# Patient Record
Sex: Male | Born: 1985 | Race: White | Hispanic: No | Marital: Single | Smoking: Current some day smoker
Health system: Southern US, Community
[De-identification: ages and names within clinical notes are randomized; demographics above are authoritative.]

## PROBLEM LIST (undated history)

## (undated) DIAGNOSIS — F419 Anxiety disorder, unspecified: Secondary | ICD-10-CM

## (undated) DIAGNOSIS — F209 Schizophrenia, unspecified: Secondary | ICD-10-CM

## (undated) HISTORY — PX: NO PAST SURGERIES: SHX2092

---

## 2016-01-26 ENCOUNTER — Emergency Department
Admission: EM | Admit: 2016-01-26 | Discharge: 2016-01-27 | Disposition: A | Discharge status: Other Acute Inpatient Hospital | Payer: Medicare Other | Attending: Emergency Medicine | Admitting: Emergency Medicine

## 2016-01-26 DIAGNOSIS — F17200 Nicotine dependence, unspecified, uncomplicated: Secondary | ICD-10-CM | POA: Diagnosis not present

## 2016-01-26 DIAGNOSIS — F200 Paranoid schizophrenia: Secondary | ICD-10-CM | POA: Diagnosis not present

## 2016-01-26 DIAGNOSIS — F209 Schizophrenia, unspecified: Secondary | ICD-10-CM | POA: Diagnosis not present

## 2016-01-26 DIAGNOSIS — Z008 Encounter for other general examination: Secondary | ICD-10-CM | POA: Diagnosis present

## 2016-01-26 DIAGNOSIS — R45851 Suicidal ideations: Secondary | ICD-10-CM | POA: Insufficient documentation

## 2016-01-26 HISTORY — DX: Anxiety disorder, unspecified: F41.9

## 2016-01-26 HISTORY — DX: Schizophrenia, unspecified: F20.9

## 2016-01-26 LAB — CBC WITH DIFFERENTIAL/PLATELET
BASOS ABS: 0 10*3/uL (ref 0–0.1)
BASOS PCT: 1 %
EOS ABS: 0.3 10*3/uL (ref 0–0.7)
Eosinophils Relative: 4 %
HCT: 47.3 % (ref 40.0–52.0)
HEMOGLOBIN: 16 g/dL (ref 13.0–18.0)
Lymphocytes Relative: 31 %
Lymphs Abs: 2.4 10*3/uL (ref 1.0–3.6)
MCH: 30.4 pg (ref 26.0–34.0)
MCHC: 33.9 g/dL (ref 32.0–36.0)
MCV: 89.6 fL (ref 80.0–100.0)
Monocytes Absolute: 0.5 10*3/uL (ref 0.2–1.0)
Monocytes Relative: 7 %
NEUTROS ABS: 4.5 10*3/uL (ref 1.4–6.5)
NEUTROS PCT: 57 %
Platelets: 258 10*3/uL (ref 150–440)
RBC: 5.27 MIL/uL (ref 4.40–5.90)
RDW: 13.1 % (ref 11.5–14.5)
WBC: 7.8 10*3/uL (ref 3.8–10.6)

## 2016-01-26 LAB — ETHANOL

## 2016-01-26 LAB — COMPREHENSIVE METABOLIC PANEL
ALBUMIN: 4.6 g/dL (ref 3.5–5.0)
ALT: 22 U/L (ref 17–63)
ANION GAP: 5 (ref 5–15)
AST: 31 U/L (ref 15–41)
Alkaline Phosphatase: 67 U/L (ref 38–126)
BUN: 22 mg/dL — ABNORMAL HIGH (ref 6–20)
CALCIUM: 9.3 mg/dL (ref 8.9–10.3)
CHLORIDE: 107 mmol/L (ref 101–111)
CO2: 28 mmol/L (ref 22–32)
Creatinine, Ser: 0.9 mg/dL (ref 0.61–1.24)
GFR calc non Af Amer: >60 mL/min (ref >60)
GLUCOSE: 81 mg/dL (ref 65–99)
POTASSIUM: 4.1 mmol/L (ref 3.5–5.1)
SODIUM: 140 mmol/L (ref 135–145)
TOTAL PROTEIN: 7.2 g/dL (ref 6.5–8.1)
Total Bilirubin: 0.4 mg/dL (ref 0.3–1.2)

## 2016-01-26 NOTE — BH Assessment (Signed)
Assessment Note  Edwin RidgelCameron Montes is an 30 y.o. male presenting to the ED voluntarily with concerns of racing thoughts, anxiety and depression.  Pt also expressed vague suicidal thoughts without intent.  Pt denies any HI or auditory/visual hallucinations.  He denies any drug/alcohol use.  He reports that he feels that he needs to be hospitalized for about a week to "pull himself together".  Diagnosis: Anxiety  Past Medical History:  Past Medical History  Diagnosis Date  . Schizophrenia (HCC)   . Anxiety     History reviewed. No pertinent past surgical history.  Family History: History reviewed. No pertinent family history.  Social History:  reports that he has been smoking.  He does not have any smokeless tobacco history on file. He reports that he does not drink alcohol or use illicit drugs.  Additional Social History:  Alcohol / Drug Use History of alcohol / drug use?: No history of alcohol / drug abuse  CIWA: CIWA-Ar BP: 127/72 mmHg Pulse Rate: 71 COWS:    Allergies: No Known Allergies  Home Medications:  (Not in a hospital admission)  OB/GYN Status:  No LMP for male patient.  General Assessment Data [facility] Assessment: In system Is this a Tele or Face-to-Face Assessment?: Face-to-Face Is this an Initial Assessment or a Re-assessment for this encounter?: Initial Assessment Marital status: Single Maiden name: N/A Is patient pregnant?: No Pregnancy Status: No Living Arrangements: Alone Can pt return to current living arrangement?: Yes Admission Status: Voluntary Is patient capable of signing voluntary admission?: Yes Referral Source: Self/Family/Friend Insurance type: AETNA  Medical Screening Exam [facility] ONLY) Medical Exam completed: Yes  Crisis Care Plan Living Arrangements: Alone Legal Guardian: Other: (self) [facility]rist[facility]rapist: [facility]  Education Status Is patient currently in school?:  No Current Grade: N/A Highest grade of school patient has completed: N/A Name of school: N/A Contact person: N/A  Risk to self with the past 6 months Suicidal Ideation: Yes-Currently Present Has patient been a risk to self within the past 6 months prior to admission? : No Suicidal Intent: No Has patient had any suicidal intent within the past 6 months prior to admission? : No Is patient at risk for suicide?: No Suicidal Plan?: No Has patient had any suicidal plan within the past 6 months prior to admission? : No Access to Means: No What has been your use of drugs/alcohol within the last 12 months?: None reported by patient Previous Attempts/Gestures: No How many times?: 0 Other Self Harm Risks: None identified Triggers for Past Attempts: None known Intentional Self Injurious Behavior: None Family Suicide History: No Recent stressful life event(s): Other (Comment) Persecutory voices/beliefs?: No Depression: Yes Depression Symptoms: Loss of interest in usual pleasures Substance abuse history and/or treatment for substance abuse?: No Suicide prevention information given to non-admitted patients: Not applicable  Risk to Others within the past 6 months Homicidal Ideation: No Does patient have any lifetime risk of violence toward others beyond the six months prior to admission? : No Thoughts of Harm to Others: No Current Homicidal Intent: No Current Homicidal Plan: No Access to Homicidal Means: No Identified Victim: None dientified History of harm to others?: No Assessment of Violence: None Noted Violent Behavior Description: None identified Does patient have access to weapons?: No Criminal Charges Pending?: No Does patient have a court date: No Is patient on probation?: No  Psychosis Hallucinations: None noted Delusions: None noted  Mental Status Report Appearance/Hygiene: In scrubs Eye  Contact: Fair Motor Activity: Freedom of movement Speech: Logical/coherent Level  of Consciousness: Alert Mood: Depressed Affect: Flat Anxiety Level: Minimal Thought Processes: Coherent, Relevant Judgement: Unimpaired Orientation: Person, Place, Time, Situation, Appropriate for developmental age Obsessive Compulsive Thoughts/Behaviors: None  Cognitive Functioning Concentration: Normal Memory: Recent Intact, Remote Intact IQ: Average Insight: Good Impulse Control: Good Appetite: Good Weight Loss: 0 Weight Gain: 0 Sleep: No Change Total Hours of Sleep: 8 Vegetative Symptoms: None  ADLScreening [facility] Services) Patient's cognitive ability adequate to safely complete daily activities?: Yes Patient able to express need for assistance with ADLs?: Yes Independently performs ADLs?: Yes (appropriate for developmental age)  Prior Inpatient Therapy Prior Inpatient Therapy: Yes Prior Therapy Dates: 2012 Prior Therapy Facilty/Provider(s): [facility] Reason for Treatment: depression  Prior Outpatient Therapy Prior Outpatient Therapy: Yes Prior Therapy Dates: current Prior Therapy [facility]Provider([facility]): [facility] Reason for Treatment: anxiety Does patient have an ACCT team?: No Does patient have Intensive In-House Services?  : No Does patient have [facility] services? : No Does patient have P4CC services?: No  ADL Screening (condition at time of admission) Patient's cognitive ability adequate to safely complete daily activities?: Yes Patient able to express need for assistance with ADLs?: Yes Independently performs ADLs?: Yes (appropriate for developmental age)       Abuse/Neglect Assessment (Assessment to be complete while patient is alone) Physical Abuse: Denies Verbal Abuse: Denies Sexual Abuse: Denies Exploitation of patient/patient's resources: Denies Self-Neglect: Denies Values / Beliefs Cultural Requests During Hospitalization: None Spiritual Requests During Hospitalization: None Consults Spiritual Care Consult Needed: No Social Work Consult  Needed: No Merchant navy officer (For Healthcare) Does patient have an advance directive?: No    Additional Information 1:1 In Past 12 Months?: No CIRT Risk: No Elopement Risk: No Does patient have medical clearance?: Yes     Disposition:  Disposition Initial Assessment Completed for this Encounter: Yes Disposition of Patient: Other dispositions Other disposition(s): Other (Comment) (Psych MD consult)  On Site Evaluation by:   Reviewed with Physician:    [place] 01/26/2016 9:51 PM

## 2016-01-26 NOTE — ED Notes (Addendum)
Pt arrived to ED with c/o "racing thoughts". Pt states "I just want to get checked out for these racing thoughts and being a danger to myself." Pt denies SI or HI. Pt has PMH of anxiety and schizophrenia.

## 2016-01-27 ENCOUNTER — Inpatient Hospital Stay
Admission: EM | Admit: 2016-01-27 | Discharge: 2016-01-31 | DRG: 885 | Disposition: A | Discharge status: Home / Self Care | Payer: Medicare Other | Source: Intra-hospital | Attending: Psychiatry | Admitting: Psychiatry

## 2016-01-27 DIAGNOSIS — G4700 Insomnia, unspecified: Secondary | ICD-10-CM | POA: Diagnosis present

## 2016-01-27 DIAGNOSIS — Z818 Family history of other mental and behavioral disorders: Secondary | ICD-10-CM | POA: Diagnosis not present

## 2016-01-27 DIAGNOSIS — R45851 Suicidal ideations: Secondary | ICD-10-CM | POA: Insufficient documentation

## 2016-01-27 DIAGNOSIS — F22 Delusional disorders: Secondary | ICD-10-CM | POA: Diagnosis present

## 2016-01-27 DIAGNOSIS — F209 Schizophrenia, unspecified: Secondary | ICD-10-CM | POA: Diagnosis not present

## 2016-01-27 DIAGNOSIS — F17200 Nicotine dependence, unspecified, uncomplicated: Secondary | ICD-10-CM | POA: Diagnosis present

## 2016-01-27 DIAGNOSIS — F203 Undifferentiated schizophrenia: Principal | ICD-10-CM | POA: Diagnosis present

## 2016-01-27 DIAGNOSIS — F200 Paranoid schizophrenia: Secondary | ICD-10-CM

## 2016-01-27 DIAGNOSIS — Z23 Encounter for immunization: Secondary | ICD-10-CM | POA: Diagnosis not present

## 2016-01-27 LAB — URINALYSIS COMPLETE WITH MICROSCOPIC (ARMC ONLY)
BACTERIA UA: NONE SEEN
BILIRUBIN URINE: NEGATIVE
Glucose, UA: NEGATIVE mg/dL
HGB URINE DIPSTICK: NEGATIVE
KETONES UR: NEGATIVE mg/dL
Leukocytes, UA: NEGATIVE
NITRITE: NEGATIVE
PH: 6 (ref 5.0–8.0)
PROTEIN: NEGATIVE mg/dL
Specific Gravity, Urine: 1.025 (ref 1.005–1.030)
WBC UA: NONE SEEN WBC/hpf (ref 0–5)

## 2016-01-27 LAB — URINE DRUG SCREEN, QUALITATIVE (ARMC ONLY)
Amphetamines, Ur Screen: NOT DETECTED
BARBITURATES, UR SCREEN: NOT DETECTED
BENZODIAZEPINE, UR SCRN: NOT DETECTED
CANNABINOID 50 NG, UR ~~LOC~~: NOT DETECTED
Cocaine Metabolite,Ur ~~LOC~~: NOT DETECTED
MDMA (Ecstasy)Ur Screen: NOT DETECTED
Methadone Scn, Ur: NOT DETECTED
Opiate, Ur Screen: NOT DETECTED
PHENCYCLIDINE (PCP) UR S: NOT DETECTED
TRICYCLIC, UR SCREEN: NOT DETECTED

## 2016-01-27 LAB — LIPID PANEL
Cholesterol: 192 mg/dL (ref 0–200)
HDL: 61 mg/dL (ref >40)
LDL Cholesterol: 100 mg/dL — ABNORMAL HIGH (ref 0–99)
TRIGLYCERIDES: 154 mg/dL — AB (ref <150)
Total CHOL/HDL Ratio: 3.1 RATIO
VLDL: 31 mg/dL (ref 0–40)

## 2016-01-27 LAB — TSH: TSH: 1.495 u[IU]/mL (ref 0.350–4.500)

## 2016-01-27 MED ORDER — NICOTINE 21 MG/24HR TD PT24
21.0000 mg | MEDICATED_PATCH | Freq: Every day | TRANSDERMAL | Status: DC
  Administered 2016-01-28 – 2016-01-31 (×3): 21 mg via TRANSDERMAL
  Filled 2016-01-27 (×4): qty 1

## 2016-01-27 MED ORDER — TRAZODONE HCL 100 MG PO TABS
100.0000 mg | ORAL_TABLET | Freq: Every day | ORAL | Status: DC
  Administered 2016-01-28 – 2016-01-30 (×3): 100 mg via ORAL
  Filled 2016-01-27 (×3): qty 1

## 2016-01-27 MED ORDER — HYDROXYZINE HCL 25 MG PO TABS
25.0000 mg | ORAL_TABLET | Freq: Three times a day (TID) | ORAL | Status: DC | PRN
  Administered 2016-01-29: 25 mg via ORAL
  Filled 2016-01-27: qty 1

## 2016-01-27 MED ORDER — ALUM & MAG HYDROXIDE-SIMETH 200-200-20 MG/5ML PO SUSP: 30.0000 mL | ORAL | Status: DC | PRN

## 2016-01-27 MED ORDER — INFLUENZA VAC SPLIT QUAD 0.5 ML IM SUSY
0.5000 mL | PREFILLED_SYRINGE | INTRAMUSCULAR | Status: AC
  Administered 2016-01-28: 0.5 mL via INTRAMUSCULAR
  Filled 2016-01-27: qty 0.5

## 2016-01-27 MED ORDER — MAGNESIUM HYDROXIDE 400 MG/5ML PO SUSP: 30.0000 mL | Freq: Every day | ORAL | Status: DC | PRN

## 2016-01-27 MED ORDER — ARIPIPRAZOLE 10 MG PO TABS
20.0000 mg | ORAL_TABLET | Freq: Every day | ORAL | Status: DC
  Administered 2016-01-28: 20 mg via ORAL
  Filled 2016-01-27: qty 2

## 2016-01-27 MED ORDER — ACETAMINOPHEN 325 MG PO TABS: 650.0000 mg | ORAL_TABLET | Freq: Four times a day (QID) | ORAL | Status: DC | PRN

## 2016-01-27 NOTE — Progress Notes (Signed)
D:  Patient is a 30  year-old male admitted to [facility] ambulatory without difficulty.  Patient is alert and oriented upon admission. A:  Admission assessment completed without difficulty.  Skin and contraband assessment completed with no skin abnormalities nor contraband found.  Q.15 minute safety checks were implemented at the time of admission.  Patient was oriented to the unit and escorted to room #309. R:  Patient was receptive to and cooperative with admission assessment.  Patient contracts for safety on the unit at this time

## 2016-01-27 NOTE — ED Notes (Signed)
[facility] for inpatient treatment. Will continue all security checks until discharge.

## 2016-01-27 NOTE — ED Notes (Signed)
Patient resting quietly in room. No noted distress or abnormal behaviors noted. Will continue 15 minute checks and observation by security camera for safety. 

## 2016-01-27 NOTE — ED Provider Notes (Signed)
[facility] Emergency Department Provider Note  ____________________________________________  Time seen: 11:45 PM  I have reviewed the triage vital signs and the nursing notes.   HISTORY  Chief Complaint Psychiatric Evaluation    HPI Edwin Montes is a 30 y.o. male presents with "racing thoughts in the past few months with acute worsening in the past few days. Patient states that he "just want to get checked out for his racing thoughts". Patient also admits to periodic thoughts of hurting himself in no specific plan.      Past Medical History  Diagnosis Date  . Schizophrenia (HCC)   . Anxiety     There are no active problems to display for this patient.    past surgical history  None  No current outpatient prescriptions on file.  Allergies  No known drug allergies  History reviewed. No pertinent family history.  Social History Social History  Substance Use Topics  . Smoking status: Current Some Day Smoker  . Smokeless tobacco: None  . Alcohol Use: No    Review of Systems  Constitutional: Negative for fever. Eyes: Negative for visual changes. ENT: Negative for sore throat. Cardiovascular: Negative for chest pain. Respiratory: Negative for shortness of breath. Gastrointestinal: Negative for abdominal pain, vomiting and diarrhea. Genitourinary: Negative for dysuria. Musculoskeletal: Negative for back pain. Skin: Negative for rash. Neurological: Negative for headaches, focal weakness or numbness. Psychiatric: Positive for racing thoughts and suicidal ideation   10-point ROS otherwise negative.  ____________________________________________   PHYSICAL EXAM:  VITAL SIGNS: ED Triage Vitals  Enc Vitals Group     BP 01/26/16 1918 127/72 mmHg     Pulse Rate 01/26/16 1918 71     Resp 01/26/16 1918 18     Temp 01/26/16 1918 98.4 F (36.9 C)     Temp Source 01/26/16 1918 Oral     SpO2 01/26/16 1918 100 %     Weight 01/26/16 1918  178 lb (80.74 kg)     Height 01/26/16 1918  (1.88 m)     Head Cir --      Peak Flow --      Pain Score 01/26/16 2100 0     Pain Loc --      Pain Edu? --      Excl. in GC? --     Constitutional: Alert and oriented. Well appearing and in no distress. Eyes: Conjunctivae are normal. PERRL. Normal extraocular movements. ENT   Head: Normocephalic and atraumatic.   Nose: No congestion/rhinnorhea.   Mouth/Throat: Mucous membranes are moist.   Neck: No stridor. Hematological/Lymphatic/Immunilogical: No cervical lymphadenopathy. Cardiovascular: Normal rate, regular rhythm. Normal and symmetric distal pulses are present in all extremities. No murmurs, rubs, or gallops. Respiratory: Normal respiratory effort without tachypnea nor retractions. Breath sounds are clear and equal bilaterally. No wheezes/rales/rhonchi. Gastrointestinal: Soft and nontender. No distention. There is no CVA tenderness. Genitourinary: deferred Musculoskeletal: Nontender with normal range of motion in all extremities. No joint effusions.  No lower extremity tenderness nor edema. Neurologic:  Normal speech and language. No gross focal neurologic deficits are appreciated. Speech is normal.  Skin:  Skin is warm, dry and intact. No rash noted. Psychiatric: Mood and affect are normal. Speech and behavior are normal. Patient exhibits appropriate insight and judgment.  ____________________________________________    LABS (pertinent positives/negatives)  Labs Reviewed  COMPREHENSIVE METABOLIC PANEL - Abnormal; Notable for the following:    BUN 22 (*)    All other components within normal limits  ETHANOL  CBC WITH DIFFERENTIAL/PLATELET  URINE DRUG SCREEN, QUALITATIVE ([facility] ONLY)  URINALYSIS COMPLETEWITH MICROSCOPIC ([facility] ONLY)        INITIAL IMPRESSION / ASSESSMENT AND PLAN / ED COURSE  Pertinent labs & imaging results that were available during my care of the patient were reviewed by me and  considered in my medical decision making (see chart for details).  Awaiting psychiatric evaluation.  ____________________________________________   FINAL CLINICAL IMPRESSION(S) / ED DIAGNOSES  Final diagnoses:  Suicidal ideation      Darci Currentandolph N Geraldo Haris, MD 01/27/16 [phone]

## 2016-01-27 NOTE — ED Notes (Signed)
Patient asleep in room. No noted distress or abnormal behavior. Will continue 15 minute checks and observation by security cameras for safety. 

## 2016-01-27 NOTE — ED Provider Notes (Signed)
-----------------------------------------   7:29 AM on 01/27/2016 -----------------------------------------   Blood pressure 106/59, pulse 64, temperature 97.7 F (36.5 C), temperature source Oral, resp. rate 16, height 6\' 2"  (1.88 m), weight 178 lb (80.74 kg), SpO2 98 %.  The patient had no acute events since last update.  Calm and cooperative at this time.  Disposition is pending per Psychiatry/Behavioral Medicine team recommendations.     Jennye MoccasinBrian S Author Hatlestad, MD 01/27/16 [phone]

## 2016-01-27 NOTE — ED Notes (Signed)
Pt is alert and oriented on admission. Pt denies SI/HI and AVH and contracts for safety as well. Pt states that he wants to make good decisions and needs help with that goal. Pt also participates in a day program that wants to continue. Writer oriented pt to the [facility], discussed tx plan and 15 minute checks are ongoing for safety. Pt went to bed on arrival to the unit.

## 2016-01-27 NOTE — Consult Note (Signed)
[place] [facility] Consult   Reason for Consult:  Consult for 30 year old man who brought himself into the emergency room voluntarily Referring Physician:  Marcelene Butte Patient Identification: Edwin Montes MRN:  [id number] Principal Diagnosis: Schizophrenia [facility]) Diagnosis:   Patient Active Problem List   Diagnosis Date Noted  . Schizophrenia ([place]) [F20.9] 01/27/2016    Total Time spent with patient: 45 minutes  Subjective:   Edwin Montes is a 30 y.o. male patient admitted with "feelings of doubts and frustration".  HPI:  Patient interviewed. Chart reviewed although there is relatively little information. Labs reviewed. Patient brought himself into the emergency room voluntarily. He is not a very good historian. He told me that he was having feelings of doubts and frustration that just started yesterday. He has hard time being any more specific than that. He denies that he's having acute hallucinations. He denies to me that he was having any intention or thought of hurting himself although at the same time he tells me that he was worried that he might end up having thoughts about hurting himself. Denies any thoughts of hurting anybody else. He says he does have a lot of stress but when I ask him to describe what it is he couldn't come up with anything specific. He indicates that he thinks he sleeps more or less okay. Denies any physical symptoms. Denies any abuse of alcohol or drugs. He says he has been compliant with his psychiatric medicine although he can't remember exactly what it is. Apparently he goes to Dr. Alice Reichert for his primary psychiatric treatment. Despite the lack of details he does look troubled and seems to be having a hard time putting his thoughts together.  Social history: Patient says he lives by himself. He does have some family including the mother he stays in touch with. He says he goes to a day program regularly.  Medical history: Denies any medical problems. No  history of diabetes high blood pressure heart disease.  Substance abuse history: Denies any alcohol or drug abuse currently and denies that he's had any problems with alcohol or drug abuse in the past.  Past Psychiatric History: Patient says that he has been told that he has schizophrenia. He can't give me any detail about the symptoms of it. He had a hard time even telling me if he's ever been in a psychiatric hospital before. He seems to indicate that maybe he had but he couldn't quite remember area denies ever trying to kill himself in the past. Denies being violent. He says he only medicines he can remember were Zoloft Klonopin and "[place]".  Risk to Self: Suicidal Ideation: Yes-Currently Present Suicidal Intent: No Is patient at risk for suicide?: No Suicidal Plan?: No Access to Means: No What has been your use of drugs/alcohol within the last 12 months?: None reported by patient How many times?: 0 Other Self Harm Risks: None identified Triggers for Past Attempts: None known Intentional Self Injurious Behavior: None Risk to Others: Homicidal Ideation: No Thoughts of Harm to Others: No Current Homicidal Intent: No Current Homicidal Plan: No Access to Homicidal Means: No Identified Victim: None dientified History of harm to others?: No Assessment of Violence: None Noted Violent Behavior Description: None identified Does patient have access to weapons?: No Criminal Charges Pending?: No Does patient have a court date: No Prior Inpatient Therapy: Prior Inpatient Therapy: Yes Prior Therapy Dates: 2012 Prior Therapy Facilty[facility]Provider(s): [facility] Reason for Treatment: depression Prior Outpatient Therapy: Prior Outpatient Therapy: Yes Prior Therapy Dates: current  Prior Therapy Facilty/Provider(s): [facility] Reason for Treatment: anxiety Does patient have an ACCT team?: No Does patient have Intensive In-House Services?  : No Does patient have [facility] services? : No Does patient have  P4CC services?: No  Past Medical History:  Past Medical History  Diagnosis Date  . Schizophrenia ([place])   . Anxiety    History reviewed. No pertinent past surgical history. Family History: History reviewed. No pertinent family history. Family Psychiatric  History: Patient says that his grandmother had depression but he doesn't know of any other family history of mental health problems. Social History:  History  Alcohol Use No     History  Drug Use No    Social History   Social History  . Marital Status: Single    Spouse Name: N/A  . Number of Children: N/A  . Years of Education: N/A   Social History Main Topics  . Smoking status: Current Some Day Smoker  . Smokeless tobacco: None  . Alcohol Use: No  . Drug Use: No  . Sexual Activity: Not Asked   Other Topics Concern  . None   Social History Narrative  . None   Additional Social History:    Allergies:  No Known Allergies  Labs:  Results for orders placed or performed during the hospital encounter of 01/26/16 (from the past 48 hour(s))  Comprehensive metabolic panel     Status: Abnormal   Collection Time: 01/26/16  7:23 PM  Result Value Ref Range   Sodium 140 135 - 145 mmol/L   Potassium 4.1 3.5 - 5.1 mmol/L   Chloride 107 101 - 111 mmol/L   CO2 28 22 - 32 mmol/L   Glucose, Bld 81 65 - 99 mg/dL   BUN 22 (H) 6 - 20 mg/dL   Creatinine, Ser 0.90 0.61 - 1.24 mg/dL   Calcium 9.3 8.9 - 10.3 mg/dL   Total Protein 7.2 6.5 - 8.1 g/dL   Albumin 4.6 3.5 - 5.0 g/dL   AST 31 15 - 41 U/L   ALT 22 17 - 63 U/L   Alkaline Phosphatase 67 38 - 126 U/L   Total Bilirubin 0.4 0.3 - 1.2 mg/dL   GFR calc non Af Amer >60 >60 mL/min   GFR calc Af Amer >60 >60 mL/min    Comment: (NOTE) The eGFR has been calculated using the CKD EPI equation. This calculation has not been validated in all clinical situations. eGFR's persistently <60 mL/min signify possible Chronic Kidney Disease.    Anion gap 5 5 - 15  Ethanol     Status: None    Collection Time: 01/26/16  7:23 PM  Result Value Ref Range   Alcohol, Ethyl (B) <5 <5 mg/dL    Comment:        LOWEST DETECTABLE LIMIT FOR SERUM ALCOHOL IS 5 mg/dL FOR MEDICAL PURPOSES ONLY   CBC with Diff     Status: None   Collection Time: 01/26/16  7:23 PM  Result Value Ref Range   WBC 7.8 3.8 - 10.6 K/uL   RBC 5.27 4.40 - 5.90 MIL/uL   Hemoglobin 16.0 13.0 - 18.0 g/dL   HCT 47.3 40.0 - 52.0 %   MCV 89.6 80.0 - 100.0 fL   MCH 30.4 26.0 - 34.0 pg   MCHC 33.9 32.0 - 36.0 g/dL   RDW 13.1 11.5 - 14.5 %   Platelets 258 150 - 440 K/uL   Neutrophils Relative % 57 %   Neutro Abs 4.5 1.4 - 6.5  K/uL   Lymphocytes Relative 31 %   Lymphs Abs 2.4 1.0 - 3.6 K/uL   Monocytes Relative 7 %   Monocytes Absolute 0.5 0.2 - 1.0 K/uL   Eosinophils Relative 4 %   Eosinophils Absolute 0.3 0 - 0.7 K/uL   Basophils Relative 1 %   Basophils Absolute 0.0 0 - 0.1 K/uL  Urine Drug Screen, Qualitative ([facility] only)     Status: None   Collection Time: 01/27/16 12:06 AM  Result Value Ref Range   Tricyclic, Ur Screen NONE DETECTED NONE DETECTED   Amphetamines, Ur Screen NONE DETECTED NONE DETECTED   MDMA (Ecstasy)Ur Screen NONE DETECTED NONE DETECTED   Cocaine Metabolite,Ur [place] NONE DETECTED NONE DETECTED   Opiate, Ur Screen NONE DETECTED NONE DETECTED   Phencyclidine (PCP) Ur S NONE DETECTED NONE DETECTED   Cannabinoid 50 Ng, Ur [place] NONE DETECTED NONE DETECTED   Barbiturates, Ur Screen NONE DETECTED NONE DETECTED   Benzodiazepine, Ur Scrn NONE DETECTED NONE DETECTED   Methadone Scn, Ur NONE DETECTED NONE DETECTED    Comment: (NOTE) 614  Tricyclics, urine               Cutoff 1000 ng/mL 200  Amphetamines, urine             Cutoff 1000 ng/mL 300  MDMA (Ecstasy), urine           Cutoff 500 ng/mL 400  Cocaine Metabolite, urine       Cutoff 300 ng/mL 500  Opiate, urine                   Cutoff 300 ng/mL 600  Phencyclidine (PCP), urine      Cutoff 25 ng/mL 700  Cannabinoid, urine              Cutoff 50  ng/mL 800  Barbiturates, urine             Cutoff 200 ng/mL 900  Benzodiazepine, urine           Cutoff 200 ng/mL 1000 Methadone, urine                Cutoff 300 ng/mL 1100 1200 The urine drug screen provides only a preliminary, unconfirmed 1300 analytical test result and should not be used for non-medical 1400 purposes. Clinical consideration and professional judgment should 1500 be applied to any positive drug screen result due to possible 1600 interfering substances. A more specific alternate chemical method 1700 must be used in order to obtain a confirmed analytical result.  1800 Gas chromato graphy / mass spectrometry (GC/MS) is the preferred 1900 confirmatory method.   Urinalysis complete, with microscopic ([facility] only)     Status: Abnormal   Collection Time: 01/27/16 12:06 AM  Result Value Ref Range   Color, Urine AMBER (A) YELLOW   APPearance TURBID (A) CLEAR   Glucose, UA NEGATIVE NEGATIVE mg/dL   Bilirubin Urine NEGATIVE NEGATIVE   Ketones, ur NEGATIVE NEGATIVE mg/dL   Specific Gravity, Urine 1.025 1.005 - 1.030   Hgb urine dipstick NEGATIVE NEGATIVE   pH 6.0 5.0 - 8.0   Protein, ur NEGATIVE NEGATIVE mg/dL   Nitrite NEGATIVE NEGATIVE   Leukocytes, UA NEGATIVE NEGATIVE   RBC / HPF 0-5 0 - 5 RBC/hpf   WBC, UA NONE SEEN 0 - 5 WBC/hpf   Bacteria, UA NONE SEEN NONE SEEN   Squamous Epithelial / LPF 0-5 (A) NONE SEEN   Mucous PRESENT    Amorphous Crystal PRESENT  No current facility-administered medications for this encounter.   Current Outpatient Prescriptions  Medication Sig Dispense Refill  . ARIPiprazole (ABILIFY) 20 MG tablet Take 20 mg by mouth at bedtime.    Marland Kitchen HYDROXYZINE HCL PO Take 1 tablet by mouth at bedtime.      Musculoskeletal: Strength & Muscle Tone: within normal limits Gait & Station: normal Patient leans: N/A  Psychiatric Specialty Exam: Review of Systems  Constitutional: Negative.   HENT: Negative.   Eyes: Negative.   Respiratory:  Negative.   Cardiovascular: Negative.   Gastrointestinal: Negative.   Musculoskeletal: Negative.   Skin: Negative.   Neurological: Negative.   Psychiatric/Behavioral: Negative for depression, suicidal ideas, hallucinations, memory loss and substance abuse. The patient is nervous/anxious. The patient does not have insomnia.     Blood pressure 106/59, pulse 64, temperature 97.7 F (36.5 C), temperature source Oral, resp. rate 16, height '6\' 2"'  (1.88 m), weight 80.74 kg (178 lb), SpO2 98 %.Body mass index is 22.84 kg/(m^2).  General Appearance: Casual  Eye Contact::  None  Speech:  Slow  Volume:  Decreased  Mood:  Anxious  Affect:  Flat  Thought Process:  Disorganized  Orientation:  Full (Time, Place, and Person)  Thought Content:  Rumination  Suicidal Thoughts:  Yes.  without intent/plan  Homicidal Thoughts:  No  Memory:  Immediate;   Good Recent;   Good Remote;   Poor  Judgement:  Impaired  Insight:  Shallow  Psychomotor Activity:  Decreased  Concentration:  Poor  Recall:  Poor  Fund of Knowledge:Fair  Language: Fair  Akathisia:  No  Handed:  Right  AIMS (if indicated):     Assets:  Housing Physical Health  ADL's:  Intact  Cognition: Impaired,  Mild  Sleep:      Treatment Plan Summary: Daily contact with patient to assess and evaluate symptoms and progress in treatment, Medication management and Plan 30 year old man who says he's got a past history of schizophrenia. Although he has a hard time articulating symptoms everything about his presentation looks like he probably does have schizophrenia. He has a very flat affect paucity of speech seems to lack motivation or lack much organization to his thoughts. For some reason he felt he needed to come into the hospital and seems to still want to do that. He did make some statements about how he might have thoughts about hurting himself although when you get down the specifics there is not much there. Nevertheless since he is living  by himself and we don't know much else about a month going to admit him to the hospital. He couldn't tell me a name to his medicine that made any sense to me. According to the reconciliation he's been prescribed Abilify 20 mg a day so on going to continue the prescription for that. Continue observation. Check TSH prolactin lipid panel and hemoglobin A1c. Engage in individual and group activities downstairs. Patient is voluntary.  Disposition: Recommend psychiatric Inpatient admission when medically cleared.  Alethia Berthold, MD 01/27/2016 1:08 PM

## 2016-01-27 NOTE — Tx Team (Signed)
Initial Interdisciplinary Treatment Plan   PATIENT STRESSORS: Medication change or noncompliance   PATIENT STRENGTHS: Average or above average intelligence Communication skills General fund of knowledge Physical Health Supportive family/friends   PROBLEM LIST: Problem List/Patient Goals Date to be addressed Date deferred Reason deferred Estimated date of resolution  "make better decisions" 01/27/16     anxiety 01/27/16     "pull myself together" 01/27/16     Racing thoughts 01/27/16                                    DISCHARGE CRITERIA:  Improved stabilization in mood, thinking, and/or behavior Motivation to continue treatment in a less acute level of care  PRELIMINARY DISCHARGE PLAN: Outpatient therapy  PATIENT/FAMIILY INVOLVEMENT: This treatment plan has been presented to and reviewed with the patient, Edwin Montes.  The patient and family have been given the opportunity to ask questions and make suggestions.  Moshe SalisburyJennifer L Adem Costlow 01/27/2016, 7:13 PM

## 2016-01-27 NOTE — ED Notes (Signed)
Supper and milk given to pt. 

## 2016-01-27 NOTE — ED Notes (Signed)

## 2016-01-27 NOTE — ED Notes (Signed)
[facility]. Cooperative with transfer. All personal belongings sent with patient.

## 2016-01-27 NOTE — ED Notes (Signed)
Patient resting quietly in room. No noted distress or abnormal behaviors noted. Will continue 15 minute checks and observation by security camera for safety. Meal given. Patient states his "racing thoughts" are lessening. He denies SI or HI. No evidence of psychosis.

## 2016-01-27 NOTE — ED Notes (Signed)
Pt presents c/o having "racing thoughts" for "a couple of years" but states they have been getting worse today. Pt reports "I figures I'd get checked in." Pt denies having an SI plan. Pt denies active SI at this time. [facility] but does not have therapy. Pt calm and cooperative. No increased work in breathing noted.

## 2016-01-27 NOTE — ED Provider Notes (Signed)
-----------------------------------------   6:07 PM on 01/27/2016 -----------------------------------------  Patient to be admitted to psychiatry [facility].  Minna AntisKevin Conor Filsaime, MD 01/27/16 1807

## 2016-01-28 DIAGNOSIS — F203 Undifferentiated schizophrenia: Secondary | ICD-10-CM | POA: Diagnosis not present

## 2016-01-28 LAB — HEMOGLOBIN A1C: Hgb A1c MFr Bld: 4.8 % (ref 4.0–6.0)

## 2016-01-28 MED ORDER — CLONAZEPAM 0.5 MG PO TABS
0.5000 mg | ORAL_TABLET | Freq: Three times a day (TID) | ORAL | Status: DC
  Administered 2016-01-28 – 2016-01-31 (×9): 0.5 mg via ORAL
  Filled 2016-01-28 (×9): qty 1

## 2016-01-28 MED ORDER — ARIPIPRAZOLE 10 MG PO TABS
20.0000 mg | ORAL_TABLET | Freq: Every day | ORAL | Status: DC
  Administered 2016-01-29 – 2016-01-30 (×2): 20 mg via ORAL
  Filled 2016-01-28 (×2): qty 2

## 2016-01-28 MED ORDER — TOPIRAMATE 100 MG PO TABS
100.0000 mg | ORAL_TABLET | Freq: Every day | ORAL | Status: DC
  Administered 2016-01-28 – 2016-01-30 (×3): 100 mg via ORAL
  Filled 2016-01-28 (×3): qty 1

## 2016-01-28 NOTE — H&P (Signed)
Psychiatric Admission Assessment Adult  Patient Identification: Edwin RidgelCameron Montes MRN:  [id number] Date of Evaluation:  01/28/2016 Chief Complaint:  Schizophrenia Principal Diagnosis: Undifferentiated schizophrenia (HCC) Diagnosis:   Patient Active Problem List   Diagnosis Date Noted  . Undifferentiated schizophrenia (HCC) [F20.3] 01/27/2016  . Tobacco use disorder [F17.200] 01/27/2016  . Suicidal ideation [R45.851]    History of Present Illness:  Identifying data. [name] is a 30 year old male with history of schizophrenia.  Chief complaint. "My thoughts are racing and I'm suicidal for a day."  History of present illness. Information was obtained from the patient and the chart. The patient has a long history of schizophrenia diagnosed about 10 years ago he has been doing exceedingly well on Abilify and graduated from the group home into his own supervised apartment several months ago. He sees [facility] irregularity and takes Abilify as prescribed. For about a day, the patient has been feeling increasingly anxious, with racing disorganized thoughts, insomnia and vague thoughts of hurting himself. He denies frank suicidal ideations intentions or plans but feels confused, paranoid and uncertain. He worries about his safety. He admits to "occasional" auditory hallucinations, paranoia, clairvoyance, messages from songs and TV. He sustained usually good has been poor lately. His anxiety has increased. He reports symptoms of generalized anxiety disorder, infrequent panic attacks and some symptoms suggestive of OCD with excessive worries, excessive cleaning and organizing. He denies PTSD symptoms. He denies symptoms of depression but admits that when his anxiety gets high he also feels depressed. He denies alcohol or illicit drugs or prescription pill abuse.  Past psychiatric history. The patient is not a good historian and oftentimes gives me conflicting information. Apparently he was diagnosed  at the age of 30 and was placed in a group home at that time. He has been a resident of multiple group homes up until recently when he "graduated" and was allowed to live in his own apartment. He reports being hospitalized 7 or 8 times in the past. He denies ever attempting suicide. He cannot explain why he was in the hospital. He believes that he was given diagnosis of bipolar, schizoaffective disorder, schizophrenia, and anxiety. He has been tried on multiple medications including Risperdal, Invega, Zyprexa, Latuda, and Saphris. He does not remember ever being on Seroquel or Geodon. He was tried on Depakote but not tolerated. He has never taken lithium or Tegretol. In the past he was treated with SSRIs for anxiety. He was given Klonopin in the past.  Family psychiatric history. Grandmother with depression.  Social history. [place] He graduated from high school. He denies any history of abuse. He has been a resident of multiple group homes. Apparently he recently moved into independent, supervised apartment in [place]. He sees [facility] and Dr. Lacie ScottsNiemeyer as his primary doctor. [facility] [employer]  Total Time spent with patient: 1 hour  Past Psychiatric History: Schizophrenia.  Is the patient at risk to self? Yes.    Has the patient been a risk to self in the past 6 months? No.  Has the patient been a risk to self within the distant past? No.  Is the patient a risk to others? No.  Has the patient been a risk to others in the past 6 months? No.  Has the patient been a risk to others within the distant past? No.   Prior Inpatient Therapy:   Prior Outpatient Therapy:  Alcohol Screening: 1. How often do you have a drink containing alcohol?: Never 2. How many drinks containing alcohol do you have on a typical day when you are drinking?: 1 or 2 3. How often do you have six or more drinks on one occasion?:  Never Preliminary Score: 0 4. How often during the last year have you found that you were not able to stop drinking once you had started?: Never 5. How often during the last year have you failed to do what was normally expected from you becasue of drinking?: Never 6. How often during the last year have you needed a first drink in the morning to get yourself going after a heavy drinking session?: Never 7. How often during the last year have you had a feeling of guilt of remorse after drinking?: Never 8. How often during the last year have you been unable to remember what happened the night before because you had been drinking?: Never 9. Have you or someone else been injured as a result of your drinking?: No 10. Has a relative or friend or a doctor or another health worker been concerned about your drinking or suggested you cut down?: No Alcohol Use Disorder Identification Test Final Score (AUDIT): 0 Brief Intervention: AUDIT score less than 7 or less-screening does not suggest unhealthy drinking-brief intervention not indicated Substance Abuse History in the last 12 months:  No. Consequences of Substance Abuse: NA Previous Psychotropic Medications: Yes  Psychological Evaluations: No  Past Medical History:  Past Medical History  Diagnosis Date  . Schizophrenia (HCC)   . Anxiety    History reviewed. No pertinent past surgical history. Family History: History reviewed. No pertinent family history. Family Psychiatric  History: Grandmother with depression. Tobacco Screening: [phone]::1)@ Social History:  History  Alcohol Use No     History  Drug Use No    Additional Social History:                           Allergies:  No Known Allergies Lab Results:  Results for orders placed or performed during the hospital encounter of 01/27/16 (from the past 48 hour(s))  Hemoglobin A1c     Status: None   Collection Time: 01/27/16  7:42 PM  Result Value Ref Range   Hgb A1c  MFr Bld 4.8 4.0 - 6.0 %  Lipid panel, fasting     Status: Abnormal   Collection Time: 01/27/16  7:42 PM  Result Value Ref Range   Cholesterol 192 0 - 200 mg/dL   Triglycerides 161 (H) <150 mg/dL   HDL 61 >09 mg/dL   Total CHOL/HDL Ratio 3.1 RATIO   VLDL 31 0 - 40 mg/dL   LDL Cholesterol 604 (H) 0 - 99 mg/dL    Comment:        Total Cholesterol/HDL:CHD Risk Coronary Heart Disease Risk Table                     Men   Women  1/2 Average Risk   3.4   3.3  Average Risk       5.0   4.4  2 X Average Risk   9.6   7.1  3 X Average Risk  23.4   11.0        Use the calculated Patient Ratio above and the CHD Risk Table to determine the patient's CHD Risk.        ATP III CLASSIFICATION (LDL):  <  100     mg/dL   Optimal  161-096  mg/dL   Near or Above                    Optimal  130-159  mg/dL   Borderline  045-409  mg/dL   High  >811     mg/dL   Very High   TSH     Status: None   Collection Time: 01/27/16  7:42 PM  Result Value Ref Range   TSH 1.495 0.350 - 4.500 uIU/mL    Blood Alcohol level:  Lab Results  Component Value Date   ETH <5 01/26/2016    Metabolic Disorder Labs:  Lab Results  Component Value Date   HGBA1C 4.8 01/27/2016   No results found for: PROLACTIN Lab Results  Component Value Date   CHOL 192 01/27/2016   TRIG 154* 01/27/2016   HDL 61 01/27/2016   CHOLHDL 3.1 01/27/2016   VLDL 31 01/27/2016   LDLCALC 100* 01/27/2016    Current Medications: Current Facility-Administered Medications  Medication Dose Route Frequency Provider Last Rate Last Dose  . acetaminophen (TYLENOL) tablet 650 mg  650 mg Oral Q6H PRN Audery Amel, MD      . alum & mag hydroxide-simeth (MAALOX/MYLANTA) 200-200-20 MG/5ML suspension 30 mL  30 mL Oral Q4H PRN Audery Amel, MD      . Melene Muller ON 01/29/2016] ARIPiprazole (ABILIFY) tablet 20 mg  20 mg Oral QHS Zayden Maffei B Wateen Varon, MD      . hydrOXYzine (ATARAX/VISTARIL) tablet 25 mg  25 mg Oral TID PRN Yuvraj Pfeifer B Ellissa Ayo, MD      .  magnesium hydroxide (MILK OF MAGNESIA) suspension 30 mL  30 mL Oral Daily PRN Audery Amel, MD      . nicotine (NICODERM CQ - dosed in mg/24 hours) patch 21 mg  21 mg Transdermal Daily Keelyn Fjelstad B Ludie Pavlik, MD   21 mg at 01/28/16 0904  . traZODone (DESYREL) tablet 100 mg  100 mg Oral QHS Kerrick Miler B Nelly Scriven, MD   100 mg at 01/27/16 2200   PTA Medications: Prescriptions prior to admission  Medication Sig Dispense Refill Last Dose  . ARIPiprazole (ABILIFY) 20 MG tablet Take 20 mg by mouth at bedtime.   unknown at unknown  . HYDROXYZINE HCL PO Take 1 tablet by mouth at bedtime.   unknown at unknown    Musculoskeletal: Strength & Muscle Tone: within normal limits Gait & Station: normal Patient leans: N/A  Psychiatric Specialty Exam: Physical Exam  Nursing note and vitals reviewed. Constitutional: He is oriented to person, place, and time. He appears well-developed and well-nourished.  HENT:  Head: Normocephalic and atraumatic.  Eyes: Conjunctivae and EOM are normal. Pupils are equal, round, and reactive to light.  Neck: Normal range of motion.  Cardiovascular: Normal rate, regular rhythm and normal heart sounds.   Respiratory: Effort normal and breath sounds normal.  GI: Soft. Bowel sounds are normal.  Musculoskeletal: Normal range of motion.  Neurological: He is alert and oriented to person, place, and time.  Skin: Skin is warm and dry.    Review of Systems  Psychiatric/Behavioral: Positive for suicidal ideas and hallucinations. The patient is nervous/anxious.   All other systems reviewed and are negative.   Blood pressure 99/72, pulse 80, temperature 97.8 F (36.6 C), temperature source Oral, resp. rate 18, height 6\' 2"  (1.88 m), weight 81.74 kg (180 lb 3.3 oz), SpO2 100 %.Body mass index is 23.13 kg/(m^2).  General Appearance:  Casual  Eye Contact::  Good  Speech:  Clear and Coherent  Volume:  Normal  Mood:  Anxious  Affect:  Blunt  Thought Process:  Disorganized   Orientation:  Full (Time, Place, and Person)  Thought Content:  Delusions, Hallucinations: Auditory and Paranoid Ideation  Suicidal Thoughts:  Yes.  with intent/plan  Homicidal Thoughts:  No  Memory:  Immediate;   Fair Recent;   Fair Remote;   Fair  Judgement:  Fair  Insight:  Shallow  Psychomotor Activity:  Normal  Concentration:  Poor  Recall:  [employer] of Knowledge:Fair  Language: Fair  Akathisia:  No  Handed:  Right  AIMS (if indicated):     Assets:  Communication Skills Desire for Improvement Financial Resources/Insurance Housing Physical Health Resilience Social Support  ADL's:  Intact  Cognition: WNL  Sleep:  Number of Hours: 7.5     Treatment Plan Summary: Daily contact with patient to assess and evaluate symptoms and progress in treatment and Medication management   Mr. Chauvin is a 30 year old male with a history of schizophrenia admitted for suicidal ideation and disorganized thinking in the context of good medication adherence.  1. Suicidal ideation. The patient is able to contract for safety in the hospital.  2. Mood/Psychosis. He has been maintained on 20 mg of Abilify. He claims to be compliant. He does not want to increase the dose. Abilify Maintena under negotiations.   3. Headaches. We will start Topamax for headache prevention and mood stabilization.  4. Anxiety. He was taking clonazepam in the past there is no longer prescribed by [facility]. I will give low dose clonazepam while in the hospital to further stabilize the mood. He still has prn Vistaril. Please consider adding an SSRI for treatment of anxiety.  5. Metabolic syndrome screening. Lipid profile, TSH, and hemoglobin A1c are normal. Prolactin is pending.  6. Smoking. Nicotine patch is available.  7. Insomnia. Trazodone is available.  8. Disposition. The patient will be discharged to home he will follow up with Dr. Georjean Mode at [facility].   Observation Level/Precautions:  15 minute checks  Laboratory:   CBC Chemistry Profile UDS UA  Psychotherapy:    Medications:    Consultations:    Discharge Concerns:    Estimated LOS:  Other:     I certify that inpatient services furnished can reasonably be expected to improve the patient's condition.    Kristine Linea, MD 3/17/201712:40 PM

## 2016-01-28 NOTE — [facility] Group Notes (Signed)
[facility] LCSW Group Therapy  01/28/2016 3:33 PM  Type of Therapy:  Group Therapy  Participation Level:  Did Not Attend    Edwin Cherian T, MSW, LCSWA 01/28/2016, 3:33 PM  

## 2016-01-28 NOTE — Progress Notes (Signed)
D: Pt denies SI/HI/AVH, affect is flat and sad,  mood is pleasant and cooperative with care. Pt  Isolates to the room, he appears less anxious. Patient is not interacting with peers and has minimal interaction with staff.  A: Pt was offered support and encouragement. Pt was given scheduled medications. Pt was encouraged to attend groups. Q 15 minute checks were done for safety.  R:Pt did not attend group. Pt went to bed and refused nighttime medication. Pt has no complaints.Pt receptive to treatment and safety maintained on unit.

## 2016-01-28 NOTE — Plan of Care (Signed)
Problem: Diagnosis: Increased Risk For Suicide Attempt Goal: LTG-Patient Will Report Improved Mood and Deny Suicidal LTG (by discharge) Patient will report improved mood and deny suicidal ideation.  Outcome: Progressing Patient denies SI/HI     

## 2016-01-28 NOTE — [facility] Group Notes (Signed)
[facility] Group Notes:  (Nursing/MHT/Case Management/Adjunct)  Date:  01/28/2016  Time:  6:56 PM  Type of Therapy:  Group Therapy  Participation Level:  Active  Participation Quality:  Appropriate, Attentive and Supportive  Affect:  Appropriate  Cognitive:  Alert and Appropriate  Insight:  Appropriate and Good  Engagement in Group:  Engaged  Modes of Intervention:  Socialization  Summary of Progress/Problems:  Edwin SouHeather L Charrie Mcconnon 01/28/2016, 6:56 PM

## 2016-01-28 NOTE — [facility] Suicide Risk Assessment (Signed)
[facility]   Nursing information obtained from:    Demographic factors:    Current Mental Status:    Loss Factors:    Historical Factors:    Risk Reduction Factors:     Total Time spent with patient: 1 hour Principal Problem: Undifferentiated schizophrenia (HCC) Diagnosis:   Patient Active Problem List   Diagnosis Date Noted  . Undifferentiated schizophrenia (HCC) [F20.3] 01/27/2016  . Tobacco use disorder [F17.200] 01/27/2016  . Suicidal ideation [R45.851]    Subjective Data: Racing disorganized thoughts, suicidal ideation.  Continued Clinical Symptoms:  Alcohol Use Disorder Identification Test Final Score (AUDIT): 0 The "Alcohol Use Disorders Identification Test", Guidelines for Use in Primary Care, Second Edition.  World Science writerHealth Organization [facility](WHO). Score between 0-7:  no or low risk or alcohol related problems. Score between 8-15:  moderate risk of alcohol related problems. Score between 16-19:  high risk of alcohol related problems. Score 20 or above:  warrants further diagnostic evaluation for alcohol dependence and treatment.   CLINICAL FACTORS:   Severe Anxiety and/or Agitation Schizophrenia:   Depressive state Less than 28 years old Paranoid or undifferentiated type   Musculoskeletal: Strength & Muscle Tone: within normal limits Gait & Station: normal Patient leans: N/A  Psychiatric Specialty Exam: Review of Systems  Psychiatric/Behavioral: Positive for hallucinations. The patient is nervous/anxious.   All other systems reviewed and are negative.   Blood pressure 99/72, pulse 80, temperature 97.8 F (36.6 C), temperature source Oral, resp. rate 18, height 6\' 2"  (1.88 m), weight 81.74 kg (180 lb 3.3 oz), SpO2 100 %.Body mass index is 23.13 kg/(m^2).  General Appearance: Casual  Eye Contact::  Good  Speech:  Clear and Coherent  Volume:  Normal  Mood:  Anxious and Dysphoric  Affect:  Blunt  Thought Process:  Disorganized   Orientation:  Full (Time, Place, and Person)  Thought Content:  Delusions, Hallucinations: Auditory and Paranoid Ideation  Suicidal Thoughts:  Yes.  with intent/plan  Homicidal Thoughts:  No  Memory:  Immediate;   Fair Recent;   Fair Remote;   Fair  Judgement:  Fair  Insight:  Shallow  Psychomotor Activity:  Normal  Concentration:  Fair  Recall:  [employer]  Fund of Knowledge:Fair  Language: Fair  Akathisia:  No  Handed:  Right  AIMS (if indicated):     Assets:  Communication Skills Desire for Improvement Financial Resources/Insurance Housing Physical Health Resilience Social Support  Sleep:  Number of Hours: 7.5  Cognition: WNL  ADL's:  Intact    COGNITIVE FEATURES THAT CONTRIBUTE TO RISK:  None    SUICIDE RISK:   Moderate:  Frequent suicidal ideation with limited intensity, and duration, some specificity in terms of plans, no associated intent, good self-control, limited dysphoria/symptomatology, some risk factors present, and identifiable protective factors, including available and accessible social support.  PLAN OF CARE: Hospital admission, medication management, discharge planning.  Edwin Montes is a 30 year old male with a history of schizophrenia admitted for suicidal ideation and disorganized thinking in the context of good medication adherence.  1. Suicidal ideation. The patient is able to contract for safety in the hospital.  2. Mood/Psychosis. He has been maintained on 20 mg of Abilify. He claims to be compliant. He does not want to increase the dose. Abilify Maintena under negotiations.   3. Headaches. We will start Topamax for headache prevention and mood stabilization.  4. Anxiety. He was taking clonazepam in the past there is no longer prescribed by [facility]. I will  give low dose clonazepam while in the hospital to further stabilize the mood. He still has prn Vistaril. Please consider adding an SSRI for treatment of anxiety.  5. Metabolic syndrome screening. Lipid  profile, TSH, and hemoglobin A1c are normal. Prolactin is pending.  6. Smoking. Nicotine patch is available.  7. Insomnia. Trazodone is available.  8. Disposition. The patient will be discharged to home he will follow up with Dr. Georjean Mode at [facility].  I certify that inpatient services furnished can reasonably be expected to improve the patient's condition.   Kristine Linea, MD 01/28/2016, 12:33 PM

## 2016-01-28 NOTE — Progress Notes (Signed)
Patient's mood anxious this morning.Later today he stated that he feels better.Denies suicidal or homicidal ideations and AV hallucinations.Compliant with medications & attended groups.Appetite & energy level good.

## 2016-01-28 NOTE — Progress Notes (Signed)
Recreation Therapy Notes  Date: 03.17.17 Time: 3:00 pm Location: Craft Room  Group Topic: Coping Skills  Goal Area(s) Addresses:  Patient will participate in healthy coping skill. Patient will verbalize benefit of using art as a coping skill.  Behavioral Response: Attentive  Intervention: Coloring  Activity: Patients were given coloring sheets instructed to color. While coloring, patients were encouraged to think about the emotions they were feeling and what they were focused on.  Education: LRT educated patients on healthy coping skills.  Education Outcome: In group clarification offered   Clinical Observations/Feedback: Patient completed activity by coloring color sheet. Patient did not contribute to group discussion.  Jacquelynn CreeGreene,Shandreka Dante M, LRT/CTRS 01/28/2016 4:36 PM

## 2016-01-28 NOTE — [facility] Group Notes (Signed)
[facility] Note   01/28/2016 1:35 PM  Participation Quality:  Active   Mood/Affect:  Flat  Depression Rating:  8  Anxiety Rating:  8  Thoughts of Suicide:  No Will you contract for safety?   NA  Current AVH:  No  Plan for Discharge/Comments:  Pt plans to return home and follow up with outpatient.  He states his depression and anxiety have slightly improved since admission. He reports he lives alone but states he does not have an outpatient provider.   Transportation Means: Family  Supports: Family   Edwin Montes Edwin Montes MSW, [employer]CSWA

## 2016-01-28 NOTE — Progress Notes (Signed)
Recreation Therapy Notes  INPATIENT RECREATION THERAPY ASSESSMENT  Patient Details Name: Edwin RidgelCameron Montes MRN: [id number] DOB: [birth year] Today's Date: 01/28/2016  Patient Stressors:  Patient reported no stressors.  Coping Skills:   Isolate, Exercise, Art/Dance, Talking, Music, Sports, Other (Comment) (Cleaning)  Personal Challenges: Anger, Communication, Concentration, Decision-Making, Self-Esteem/Confidence  Leisure Interests (2+):  Music - Play instrument, Individual - Other (Comment) (Soccer)  Awareness of Community Resources:  No  Community Resources:     Current Use:    If no, Barriers?:    Patient Strengths:  Caring, good life style  Patient Identified Areas of Improvement:  Making sure he has adequate amounts of belief- improve belief towards humanity  Current Recreation Participation:  Listening to music  Patient Goal for Hospitalization:  To stay active  [place]  [place] Residence:  [place]   Current [place] (including self-harm):  No  Current HI:  No  Consent to Intern Participation: N/A   Jacquelynn CreeGreene,Shatora Weatherbee M, LRT/CTRS 01/28/2016, 1:46 PM

## 2016-01-28 NOTE — Plan of Care (Signed)
Problem: Alteration in mood; excessive anxiety as evidenced by: Goal: LTG-Patient's behavior demonstrates decreased anxiety (Patient's behavior demonstrates anxiety and he/she is utilizing learned coping skills to deal with anxiety-producing situations)  Outcome: Progressing Patient verbalized decreased anxiety level.

## 2016-01-29 LAB — PROLACTIN: PROLACTIN: 1.7 ng/mL — AB (ref 4.0–15.2)

## 2016-01-29 MED ORDER — SERTRALINE HCL 25 MG PO TABS
25.0000 mg | ORAL_TABLET | Freq: Every day | ORAL | Status: DC
  Administered 2016-01-29 – 2016-01-30 (×2): 25 mg via ORAL
  Filled 2016-01-29 (×2): qty 1

## 2016-01-29 NOTE — Plan of Care (Signed)
Problem: Ineffective individual coping Goal: STG: Patient will remain free from self harm Outcome: Progressing Pt safe on the unit at this time     

## 2016-01-29 NOTE — Progress Notes (Signed)
[facility] Psych Progress Notes:   [facility] MD Progress Note  01/29/2016 1:59 PM Edwin Montes  MRN:  [id number]   Subjective:  [name] is a 30 year old male with history of schizophrenia.   Per H &P: history of schizophrenia diagnosed about 10 years ago he has been doing exceedingly well on Abilify and graduated from the group home into his own supervised apartment several months ago. He sees Dr. Georjean Mode at [facility] irregularity and takes Abilify as prescribed. For about a day, the patient has been feeling increasingly anxious, with racing disorganized thoughts, insomnia and vague thoughts of hurting himself. He denies frank suicidal ideations intentions or plans but feels confused, paranoid and uncertain. He worries about his safety. He admits to "occasional" auditory hallucinations, paranoia, clairvoyance, messages from songs and TV. He sustained usually good has been poor lately. His anxiety has increased. He reports symptoms of generalized anxiety disorder, infrequent panic attacks and some symptoms suggestive of OCD with excessive worries, excessive cleaning and organizing. He denies PTSD symptoms. He denies symptoms of depression but admits that when his anxiety gets high he also feels depressed. He denies alcohol or illicit drugs or prescription pill abuse.  Today,  He was lying in bed with his eyes closed and did not talk much despite attempts to engage. Patient states that he slept well and is well rested. He has been attending groups, denies hallucinations, suicidal ideations and homicidal ideations. He feels his appetite is good as well.  Per nursing: D: Pt is pleasant and cooperative this evening. He denies SI/HI/AVH at this time. Denies pain. Pt has minimal interaction with staff and peers. A: Emotional support and encouragement provided. Medications administered with education. q15 minute safety checks maintained. R: Pt remains free from harm. Will continue to monitor.  Principal Problem:  Undifferentiated schizophrenia (HCC) Diagnosis:   Patient Active Problem List   Diagnosis Date Noted  . Undifferentiated schizophrenia (HCC) [F20.3] 01/27/2016  . Tobacco use disorder [F17.200] 01/27/2016  . Suicidal ideation [R45.851]    Total Time spent with patient: 20 minutes  Past Psychiatric History: as per HP  Past Medical History:  Past Medical History  Diagnosis Date  . Schizophrenia (HCC)   . Anxiety    History reviewed. No pertinent past surgical history. Family History: History reviewed. No pertinent family history. Family Psychiatric  History: per HP Social History:  History  Alcohol Use No     History  Drug Use No    Social History   Social History  . Marital Status: Single    Spouse Name: N/A  . Number of Children: N/A  . Years of Education: N/A   Social History Main Topics  . Smoking status: Current Some Day Smoker  . Smokeless tobacco: None  . Alcohol Use: No  . Drug Use: No  . Sexual Activity: Not Asked   Other Topics Concern  . None   Social History Narrative   Additional Social History:                         Sleep: Good  Appetite:  Good  Current Medications: Current Facility-Administered Medications  Medication Dose Route Frequency Provider Last Rate Last Dose  . acetaminophen (TYLENOL) tablet 650 mg  650 mg Oral Q6H PRN Audery Amel, MD      . alum & mag hydroxide-simeth (MAALOX/MYLANTA) 200-200-20 MG/5ML suspension 30 mL  30 mL Oral Q4H PRN Audery Amel, MD      . ARIPiprazole (  ABILIFY) tablet 20 mg  20 mg Oral QHS Jolanta B Pucilowska, MD      . clonazePAM (KLONOPIN) tablet 0.5 mg  0.5 mg Oral TID AC Jolanta B Pucilowska, MD   0.5 mg at 01/29/16 1206  . hydrOXYzine (ATARAX/VISTARIL) tablet 25 mg  25 mg Oral TID PRN Jolanta B Pucilowska, MD      . magnesium hydroxide (MILK OF MAGNESIA) suspension 30 mL  30 mL Oral Daily PRN Audery AmelJohn T Clapacs, MD      . nicotine (NICODERM CQ - dosed in mg/24 hours) patch 21 mg  21 mg  Transdermal Daily Jolanta B Pucilowska, MD   21 mg at 01/28/16 0904  . topiramate (TOPAMAX) tablet 100 mg  100 mg Oral QHS Shari ProwsJolanta B Pucilowska, MD   100 mg at 01/28/16 2122  . traZODone (DESYREL) tablet 100 mg  100 mg Oral QHS Shari ProwsJolanta B Pucilowska, MD   100 mg at 01/28/16 2122    Lab Results:  Results for orders placed or performed during the hospital encounter of 01/27/16 (from the past 48 hour(s))  Hemoglobin A1c     Status: None   Collection Time: 01/27/16  7:42 PM  Result Value Ref Range   Hgb A1c MFr Bld 4.8 4.0 - 6.0 %  Lipid panel, fasting     Status: Abnormal   Collection Time: 01/27/16  7:42 PM  Result Value Ref Range   Cholesterol 192 0 - 200 mg/dL   Triglycerides 045154 (H) <150 mg/dL   HDL 61 >40>40 mg/dL   Total CHOL/HDL Ratio 3.1 RATIO   VLDL 31 0 - 40 mg/dL   LDL Cholesterol 981100 (H) 0 - 99 mg/dL    Comment:        Total Cholesterol/HDL:CHD Risk Coronary Heart Disease Risk Table                     Men   Women  1/2 Average Risk   3.4   3.3  Average Risk       5.0   4.4  2 X Average Risk   9.6   7.1  3 X Average Risk  23.4   11.0        Use the calculated Patient Ratio above and the CHD Risk Table to determine the patient's CHD Risk.        ATP III CLASSIFICATION (LDL):  <100     mg/dL   Optimal  191-478100-129  mg/dL   Near or Above                    Optimal  130-159  mg/dL   Borderline  [id number]  mg/dL   High  >308>190     mg/dL   Very High   Prolactin     Status: Abnormal   Collection Time: 01/27/16  7:42 PM  Result Value Ref Range   Prolactin 1.7 (L) 4.0 - 15.2 ng/mL    Comment: (NOTE) Performed At: [facility] [address] [place], [place] [id number] Mila HomerHancock William F MD XB:[id number]:[phone]   TSH     Status: None   Collection Time: 01/27/16  7:42 PM  Result Value Ref Range   TSH 1.495 0.350 - 4.500 uIU/mL    Blood Alcohol level:  Lab Results  Component Value Date   ETH <5 01/26/2016    Physical Findings: AIMS:  , ,  ,  ,    CIWA:    COWS:      Musculoskeletal:  Strength & Muscle Tone: within normal limits Gait & Station: normal Patient leans: N/A  Psychiatric Specialty Exam:   Blood pressure 104/65, pulse 78, temperature 97.8 F (36.6 C), temperature source Oral, resp. rate 18, height  (1.88 m), weight 81.74 kg (180 lb 3.3 oz), SpO2 100 %.Body mass index is 23.13 kg/(m^2).  General Appearance: Casual  Eye Contact::  Fair  Speech:  Clear and Coherent  Volume:  Decreased  Mood:  Anxious  Affect:  Congruent  Thought Process:  Linear  Orientation:  Full (Time, Place, and Person)  Thought Content:  Negative  Suicidal Thoughts:  No  Homicidal Thoughts:  No  Memory:  Immediate;   Fair Recent;   Fair Remote;   Fair  Judgement:  Fair  Insight:  Fair  Psychomotor Activity:  Normal  Concentration:  Fair  Recall:  [employer] of Knowledge:Fair  Language: Fair  Akathisia:  Negative  Handed:    AIMS (if indicated):     Assets:  Physical Health  ADL's:  Intact  Cognition: WNL  Sleep:  Number of Hours: 8   Treatment Plan Summary: Daily contact with patient to assess and evaluate symptoms and progress in treatment and Medication management   Per H & P:  Mr. Nickolson is a 30 year old male with a history of schizophrenia admitted for suicidal ideation and disorganized thinking in the context of good medication adherence.  1. Suicidal ideation. The patient is able to contract for safety in the hospital.  2. Mood/Psychosis. He has been maintained on 20 mg of Abilify. He claims to be compliant. He does not want to increase the dose. Abilify Maintena under negotiations.   3. Headaches. We will start Topamax for headache prevention and mood stabilization.  4. Anxiety. He was taking clonazepam in the past there is no longer prescribed by [facility]. I will give low dose clonazepam while in the hospital to further stabilize the mood. He still has prn Vistaril. Please consider adding an SSRI for treatment of anxiety.  5. Metabolic  syndrome screening. Lipid profile, TSH, and hemoglobin A1c are normal. Prolactin is pending.  6. Smoking. Nicotine patch is available.  7. Insomnia. Trazodone is available.  8. Disposition. The patient will be discharged to home he will follow up with Dr. Georjean Mode at [facility].  3/18:  Zoloft 25 mg QHS added to address anxiety/depression as Klonopin may not be prescribed after discharge. Will monitor symptoms    Lockie Pares, MD 01/29/2016, 1:59 PM

## 2016-01-29 NOTE — Progress Notes (Signed)
Pt has been pleasant and cooperative. Pt's mood and affect has been depressed. Pt has been seclusive to his room. Pt has attended some unit activities.Pt denies SI and A/V hallucinations. Will continue to observe and maintain a safe environment.              

## 2016-01-29 NOTE — Progress Notes (Signed)
D: Pt is pleasant and cooperative this evening. He denies SI/HI/AVH at this time. Denies pain. Pt has minimal interaction with staff and peers. A: Emotional support and encouragement provided. Medications administered with education. q15 minute safety checks maintained. R: Pt remains free from harm. Will continue to monitor.

## 2016-01-29 NOTE — Progress Notes (Signed)
D: Pt denies SI/HI/AVh. Pt is pleasant and cooperative. Pt isolates to room, but is cordial. Pt stated he was doing better due to the feelings about himself.  A: Pt was offered support and encouragement. Pt was given scheduled medications. Pt was encourage to attend groups. Q 15 minute checks were done for safety.   R: Pt is taking medication. Pt has no complaints at this time.Pt receptive to treatment and safety maintained on unit.

## 2016-01-29 NOTE — Plan of Care (Signed)
Problem: Ineffective individual coping Goal: LTG: Patient will report a decrease in negative feelings Outcome: Progressing Pt stated he was feeling better about himself

## 2016-01-29 NOTE — Plan of Care (Signed)
Problem: Ineffective individual coping Goal: STG: Patient will remain free from self harm Outcome: Progressing Pt remains free from harm.  Problem: Diagnosis: Increased Risk For Suicide Attempt Goal: STG-Patient Will Comply With Medication Regime Outcome: Progressing Pt taking medications as prescribed.     

## 2016-01-30 NOTE — Plan of Care (Signed)
Problem: Diagnosis: Increased Risk For Suicide Attempt Goal: STG-Patient Will Attend All Groups On The Unit Outcome: Not Progressing Pt did not attend groups on the unit today.  Problem: Alteration in thought process Goal: LTG-Patient behavior demonstrates decreased signs psychosis (Patient behavior demonstrates decreased signs of psychosis to the point the patient is safe to return home and continue treatment in an outpatient setting.)  Outcome: Progressing Pt denies AVH. He does not appear to be responding to internal stimuli.

## 2016-01-30 NOTE — Progress Notes (Signed)
Edwin Montes  01/30/2016 3:43 PM Edwin Montes  MRN:  [id number]   Subjective:  Edwin Montes is a 30 year old male with history of schizophrenia.   Per H &P: history of schizophrenia diagnosed about 10 years ago he has been doing exceedingly well on Abilify and graduated from the group home into his own supervised apartment several months ago. [facility] and takes Abilify as prescribed. For about a day, the patient has been feeling increasingly anxious, with racing disorganized thoughts, insomnia and vague thoughts of hurting himself. He denies frank suicidal ideations intentions or plans but feels confused, paranoid and uncertain. He worries about his safety. He admits to "occasional" auditory hallucinations, paranoia, clairvoyance, messages from songs and TV. He sustained usually good has been poor lately. His anxiety has increased. He reports symptoms of generalized anxiety disorder, infrequent panic attacks and some symptoms suggestive of OCD with excessive worries, excessive cleaning and organizing. He denies PTSD symptoms. He denies symptoms of depression but admits that when his anxiety gets high he also feels depressed. He denies alcohol or illicit drugs or prescription pill abuse.  Today,  He denies SI/HI. He has made a goal to make "better decisions" while in the hospital. He wants to be discharged soon so he can go to school and back to a day program.  Discussed the benefits of using his time wisely while hospitalized.    He  denies hallucinations, slept well and feels that his appetite is good as well.  Did not attend morning group.   Per nursing:D: Pt denies SI/HI/AVh. Pt is pleasant and cooperative. Pt isolates to room, but is cordial. Pt stated he was doing better due to the feelings about himself.  A: Pt was offered support and encouragement. Pt was given scheduled medications. Pt was encourage to attend groups. Q 15 minute  checks were done for safety.   R: Pt is taking medication. Pt has no complaints at this time.Pt receptive to treatment and safety maintained on unit. Principal Problem: Undifferentiated schizophrenia (HCC) Diagnosis:   Patient Active Problem List   Diagnosis Date Noted  . Undifferentiated schizophrenia (HCC) [F20.3] 01/27/2016  . Tobacco use disorder [F17.200] 01/27/2016  . Suicidal ideation [R45.851]    Total Time spent with patient: 20 minutes  Past Psychiatric History: as per HP  Past Medical History:  Past Medical History  Diagnosis Date  . Schizophrenia (HCC)   . Anxiety    History reviewed. No pertinent past surgical history. Family History: History reviewed. No pertinent family history. Family Psychiatric  History: per HP Social History:  History  Alcohol Use No     History  Drug Use No    Social History   Social History  . Marital Status: Single    Spouse Name: N/A  . Number of Children: N/A  . Years of Education: N/A   Social History Main Topics  . Smoking status: Current Some Day Smoker  . Smokeless tobacco: None  . Alcohol Use: No  . Drug Use: No  . Sexual Activity: Not Asked   Other Topics Concern  . None   Social History Narrative   Additional Social History:                         Sleep: Good  Appetite:  Good  Current Medications: Current Facility-Administered Medications  Medication Dose Route Frequency Provider Last Rate Last Dose  .  acetaminophen (TYLENOL) tablet 650 mg  650 mg Oral Q6H PRN Audery Amel, MD      . alum & mag hydroxide-simeth (MAALOX/MYLANTA) 200-200-20 MG/5ML suspension 30 mL  30 mL Oral Q4H PRN Audery Amel, MD      . ARIPiprazole (ABILIFY) tablet 20 mg  20 mg Oral QHS Jolanta B Pucilowska, MD   20 mg at 01/29/16 2142  . clonazePAM (KLONOPIN) tablet 0.5 mg  0.5 mg Oral TID AC Jolanta B Pucilowska, MD   0.5 mg at 01/30/16 1140  . hydrOXYzine (ATARAX/VISTARIL) tablet 25 mg  25 mg Oral TID PRN Shari Prows, MD   25 mg at 01/29/16 2142  . magnesium hydroxide (MILK OF MAGNESIA) suspension 30 mL  30 mL Oral Daily PRN Audery Amel, MD      . nicotine (NICODERM CQ - dosed in mg/24 hours) patch 21 mg  21 mg Transdermal Daily Jolanta B Pucilowska, MD   21 mg at 01/30/16 0803  . sertraline (ZOLOFT) tablet 25 mg  25 mg Oral QHS Lockie Pares, MD   25 mg at 01/29/16 2142  . topiramate (TOPAMAX) tablet 100 mg  100 mg Oral QHS Jolanta B Pucilowska, MD   100 mg at 01/29/16 2142  . traZODone (DESYREL) tablet 100 mg  100 mg Oral QHS Jolanta B Pucilowska, MD   100 mg at 01/29/16 2142    Lab Results:  No results found for this or any previous visit (from the past 48 hour(s)).  Blood Alcohol level:  Lab Results  Component Value Date   ETH <5 01/26/2016    Physical Findings: AIMS:  , ,  ,  ,    CIWA:    COWS:     Musculoskeletal: Strength & Muscle Tone: within normal limits Gait & Station: normal Patient leans: N/A  Psychiatric Specialty Exam:   Blood pressure 107/64, pulse 61, temperature 97.8 F (36.6 C), temperature source Oral, resp. rate 18, height  (1.88 m), weight 81.74 kg (180 lb 3.3 oz), SpO2 100 %.Body mass index is 23.13 kg/(m^2).  General Appearance: Casual  Eye Contact::  Fair  Speech:  Clear and Coherent  Volume:  Decreased  Mood:  Anxious  Affect:  Congruent  Thought Process:  Linear  Orientation:  Full (Time, Place, and Person)  Thought Content:  Negative  Suicidal Thoughts:  No  Homicidal Thoughts:  No  Memory:  Immediate;   Fair Recent;   Fair Remote;   Fair  Judgement:  Fair  Insight:  Fair  Psychomotor Activity:  Normal  Concentration:  Fair  Recall:  [employer] of Knowledge:Fair  Language: Fair  Akathisia:  Negative  Handed:    AIMS (if indicated):     Assets:  Physical Health  ADL's:  Intact  Cognition: WNL  Sleep:  Number of Hours: 7.15   Treatment Plan Summary: Daily contact with patient to assess and evaluate symptoms and progress in  treatment and Medication management   Per H & P:  Edwin Montes is a 30 year old male with a history of schizophrenia admitted for suicidal ideation and disorganized thinking in the context of good medication adherence.  1. Suicidal ideation. The patient is able to contract for safety in the hospital.  2. Mood/Psychosis. He has been maintained on 20 mg of Abilify. He claims to be compliant. He does not want to increase the dose. Abilify Maintena under negotiations.   3. Headaches. We will start Topamax for headache prevention and mood  stabilization.  4. Anxiety. He was taking clonazepam in the past there is no longer prescribed by [facility]. I will give low dose clonazepam while in the hospital to further stabilize the mood. He still has prn Vistaril. Please consider adding an SSRI for treatment of anxiety.  5. Metabolic syndrome screening. Lipid profile, TSH, and hemoglobin A1c are normal. Prolactin is pending.  6. Smoking. Nicotine patch is available.  7. Insomnia. Trazodone is available.  8. Disposition. The patient will be discharged to home he will follow up with Dr. Georjean Mode at [facility].  3/18:  Zoloft 25 mg QHS added to address anxiety/depression as Klonopin may not be prescribed after discharge. Will monitor symptoms   3/19: continue plan as written above.  Lockie Pares, MD 01/30/2016, 3:43 PM

## 2016-01-30 NOTE — [facility] Group Notes (Signed)
[facility] LCSW Group Therapy  01/30/2016 4:17 PM  Type of Therapy:  Group Therapy  Participation Level:  Did Not Attend  Modes of Intervention:  Discussion, Education, Socialization and Support  Summary of Progress/Problems: Self esteem: Patients discussed self esteem and how it impacts them. They discussed what aspects in their lives has influenced their self esteem. They were challenged to identify changes that are needed in order to improve self esteem.    Edwin Montes L Edwin Montes MSW, LCSWA  01/30/2016, 4:17 PM   

## 2016-01-30 NOTE — [facility] Counselor (Signed)
Adult Comprehensive Assessment  Patient ID: Edwin Montes, male   DOB: 1986-05-24, 30 y.o.   MRN: [id number]  Information Source: Information source: Patient  Current Stressors:  Educational / Learning stressors: None reported  Employment / Job issues: SSDI Family Relationships: None reported  Surveyor, quantity / Lack of resources (include bankruptcy): Limited income.  Housing / Lack of housing: Pt lives alone in an apartment. Physical health (include injuries & life threatening diseases):  None reported  Social relationships: Pt goes to a day program.  Substance abuse: Denies use.  Bereavement / Loss: Pt's father died a year ago.   Living/Environment/Situation:  Living Arrangements: Alone Living conditions (as described by patient or guardian): Independent living through [facility].  How long has patient lived in current situation?: 1 month  What is atmosphere in current home: Comfortable, Supportive  Family History:  Marital status: Single Are you sexually active?: No What is your sexual orientation?: Heterosexual  Has your sexual activity been affected by drugs, alcohol, medication, or emotional stress?: None reported  Does patient have children?: No  Childhood History:  By whom was/is the patient raised?: Both parents Description of patient's relationship with caregiver when they were a child: Good relationship with parents  Patient's description of current relationship with people who raised him/her: Father deined a year ago, good relationship with mother  How were you disciplined when you got in trouble as a child/adolescent?: None reported  Does patient have siblings?: Yes Number of Siblings: 1 Description of patient's current relationship with siblings: 1 sister, close relationship  Did patient suffer any verbal/emotional/physical/sexual abuse as a child?: No Did patient suffer from severe childhood neglect?: No Has patient ever been sexually abused/assaulted/raped as  an adolescent or adult?: No Was the patient ever a victim of a crime or a disaster?: No Witnessed domestic violence?: No Has patient been effected by domestic violence as an adult?: No  Education:  Highest grade of school patient has completed: Some college  Currently a Consulting civil engineer?: No Learning disability?: No  Employment/Work Situation:   Employment situation: On disability Why is patient on disability: Mental health  How long has patient been on disability: 1 year  Patient's job has been impacted by current illness: No What is the longest time patient has a held a job?: 3 years  Where was the patient employed at that time?: [employer]  Has patient ever been in the [employer]?: No  Financial Resources:   Financial resources: Insurance claims handler, Medicaid, Medicare Does patient have a Lawyer or guardian?: Yes Name of representative payee or guardian: Mother is payee. Edwin Montes [phone]  Alcohol/Substance Abuse:   What has been your use of drugs/alcohol within the last 12 months?: None reported  If attempted suicide, did drugs/alcohol play a role in this?: No Alcohol/Substance Abuse Treatment Hx: Denies past history Has alcohol/substance abuse ever caused legal problems?: No  Social Support System:   Patient's Community Support System: Production assistant, radio System: family, PSR program  Type of faith/religion: NA How does patient's faith help to cope with current illness?: NA   Leisure/Recreation:   Leisure and Hobbies: Sports, playing music   Strengths/Needs:   What things does the patient do well?: building things, jogging  In what areas does patient struggle / problems for patient: feeling down, racing thoughs   Discharge Plan:   Does patient have access to transportation?: Yes Will patient be returning to same living situation after discharge?: Yes Currently receiving community mental health services: Yes (From Whom) (  [facility]) Does patient have  financial barriers related to discharge medications?: No  Summary/Recommendations:    Patient is a 30 year old male admitted with a diagnosis of Undifferentiated Schizophrenia. Patient presented to the hospital with depression, SI and racing thoughts. Pt currently lives alone in an independent living apartment through [facility]. For the last few years he was living at [facility] group home but moved out a month ago. He was last hospitalized 3 years ago. [facility] and goes to the [facility] program. Pt plans to return home and follow up with outpatient.   Patient will benefit from crisis stabilization, medication evaluation, group therapy and psycho education in addition to case management for discharge. At discharge, it is recommended that patient remain compliant with established discharge plan and continued treatment.   Edwin Montes. MSW, [facility]  01/30/2016

## 2016-01-30 NOTE — [facility] Group Notes (Signed)
[facility] Group Notes:  (Nursing/MHT/Case Management/Adjunct)  Date:  01/30/2016  Time:  9:44 AM  Type of Therapy:  Community Meeting   Participation Level:  Did Not Attend  Edwin Montes 01/30/2016, 9:44 AM 

## 2016-01-30 NOTE — [facility] Suicide Risk Assessment (Signed)
[facility] INPATIENT:  Family/Significant Other Suicide Prevention Education  Suicide Prevention Education:  Education Completed;Edwin Montes [phone] (mother)  has been identified by the patient as the family member/significant other with whom the patient will be residing, and identified as the person(s) who will aid the patient in the event of a mental health crisis (suicidal ideations/suicide attempt).  With written consent from the patient, the family member/significant other has been provided the following suicide prevention education, prior to the and/or following the discharge of the patient.  The suicide prevention education provided includes the following:  Suicide risk factors  Suicide prevention and interventions  National Suicide Hotline telephone number  [facility] assessment telephone number  [facility] Emergency Assistance 911  [facility] and/or Residential Mobile Crisis Unit telephone number  Request made of family/significant other to:  Remove weapons (e.g., guns, rifles, knives), all items previously/currently identified as safety concern.    Remove drugs/medications (over-the-counter, prescriptions, illicit drugs), all items previously/currently identified as a safety concern.  The family member/significant other verbalizes understanding of the suicide prevention education information provided.  The family member/significant other agrees to remove the items of safety concern listed above.  Daisy Floroandace L Shyenne Maggard MSW, LCSWA  01/30/2016, 3:38 PM

## 2016-01-30 NOTE — Plan of Care (Signed)
Problem: Diagnosis: Increased Risk For Suicide Attempt Goal: LTG-Patient Will Report Improved Mood and Deny Suicidal LTG (by discharge) Patient will report improved mood and deny suicidal ideation.  Outcome: Progressing Denies suicidal ideations.     

## 2016-01-30 NOTE — Progress Notes (Signed)
Patient rated his anxiety & depression 8/10.He was in & out of room,minimal interactions with peers.Denies suicidal ideations & AV hallucinations.Compliant with medications.Appetite & energy level good.

## 2016-01-31 MED ORDER — ARIPIPRAZOLE 20 MG PO TABS: 20.0000 mg | ORAL_TABLET | Freq: Every day | ORAL | Status: DC

## 2016-01-31 MED ORDER — CLONAZEPAM 0.5 MG PO TABS: 0.5000 mg | ORAL_TABLET | Freq: Three times a day (TID) | ORAL | Status: DC

## 2016-01-31 MED ORDER — TOPIRAMATE 100 MG PO TABS: 200.0000 mg | ORAL_TABLET | Freq: Every day | ORAL | Status: DC

## 2016-01-31 MED ORDER — ARIPIPRAZOLE ER 400 MG IM SUSR: 400.0000 mg | INTRAMUSCULAR | Status: DC

## 2016-01-31 MED ORDER — TRAZODONE HCL 100 MG PO TABS: 100.0000 mg | ORAL_TABLET | Freq: Every day | ORAL | Status: DC

## 2016-01-31 MED ORDER — ARIPIPRAZOLE ER 400 MG IM SUSR
400.0000 mg | INTRAMUSCULAR | Status: DC
  Administered 2016-01-31: 400 mg via INTRAMUSCULAR
  Filled 2016-01-31: qty 400

## 2016-01-31 NOTE — Progress Notes (Signed)
  [facility] Discharge Plan :  Will you be returning to the same living situation after discharge:  Yes,    At discharge, do you have transportation home?: No. provided transportation by cab as family unable to transport Do you have the ability to pay for your medications: Yes,     Release of information consent forms completed and in the chart;  Patient's signature needed at discharge.  Patient to Follow up at: Follow-up Information    Go to [facility].   Why:  Arrive early to minimize your wait time.  Hospital Follow up, Outpatient Medication Management, Therapy, Walk in Monday -Friday 8-3pm   Contact information:   [address] [place] [address] [phone] Fax       Next level of care provider has access to [facility] Link:no  Safety Planning and Suicide Prevention discussed: Yes,     Have you used any form of tobacco in the last 30 days? (Cigarettes, Smokeless Tobacco, Cigars, and/or Pipes): No  Has patient been referred to the Quitline?: N/A patient is not a smoker  Patient has been referred for addiction treatment: N/A  Christie Viscomi, Cleda DaubSara P, MSW, L CSW 01/31/2016, 5:01 PM

## 2016-01-31 NOTE — [facility] Group Notes (Signed)
[facility] Note   01/31/2016 9:15 AM  ?  Participation Quality: Alert, Appropriate and Oriented   Mood/Affect: Appropriate  Depression Rating: 6  Anxiety Rating: 4  Thoughts of Suicide: Pt denies SI/HI   Will you contract for safety? Yes   Current AVH: Pt denies   Plan for Discharge/Comments: Pt attended discharge planning group and actively participated in group. CSW provided pt with today's workbook.  Pt shared with the group his decision to live a better lifestyle upon discharge.  When questioned about "a Better Lifestyle" by the CSW the pt stated he intended to someday find employment, live independently and be happy.  Pt was polite and cooperative with the CSW and other group members and focused and attentive to the topics discussed and the sharing of others.   Transportation Means: Pt reports access to transportation by his mother who will be picking him up   Supports: Pt lists family and his group home as his as primary support    Dorothe PeaJonathan F. Aireal Slater, MSW, LCSWA, LCAS

## 2016-01-31 NOTE — Progress Notes (Signed)
Patient denies SI/HI, denies A/V hallucinations. Patient verbalizes understanding of discharge instructions, follow up care and prescriptions. Patient given all belongings from  locker. Patient escorted out by staff, transported by cab. 

## 2016-01-31 NOTE — Discharge Summary (Signed)
Physician Discharge Summary Note  Patient:  Edwin Montes is an 30 y.o., male MRN:  [id number] DOB:  Mar 25, 1986 Patient phone:  [phone] (home)  Patient address:   [address]. [place] [address],  Total Time spent with patient: 30 minutes  Date of Admission:  01/27/2016 Date of Discharge: 01/31/2016  Reason for Admission:  Psychotic break.  Identifying data. [name] is a 30 year old male with history of schizophrenia.  Chief complaint. "My thoughts are racing and I'm suicidal for a day."  History of present illness. Information was obtained from the patient and the chart. The patient has a long history of schizophrenia diagnosed about 10 years ago he has been doing exceedingly well on Abilify and graduated from the group home into his own supervised apartment several months ago. He sees Dr. Georjean Mode at [facility] irregularity and takes Abilify as prescribed. For about a day, the patient has been feeling increasingly anxious, with racing disorganized thoughts, insomnia and vague thoughts of hurting himself. He denies frank suicidal ideations intentions or plans but feels confused, paranoid and uncertain. He worries about his safety. He admits to "occasional" auditory hallucinations, paranoia, clairvoyance, messages from songs and TV. He sustained usually good has been poor lately. His anxiety has increased. He reports symptoms of generalized anxiety disorder, infrequent panic attacks and some symptoms suggestive of OCD with excessive worries, excessive cleaning and organizing. He denies PTSD symptoms. He denies symptoms of depression but admits that when his anxiety gets high he also feels depressed. He denies alcohol or illicit drugs or prescription pill abuse.  Past psychiatric history. The patient is not a good historian and oftentimes gives me conflicting information. Apparently he was diagnosed at the age of 43 and was placed in a group home at that time. He has been a resident of multiple  group homes up until recently when he "graduated" and was allowed to live in his own apartment. He reports being hospitalized 7 or 8 times in the past. He denies ever attempting suicide. He cannot explain why he was in the hospital. He believes that he was given diagnosis of bipolar, schizoaffective disorder, schizophrenia, and anxiety. He has been tried on multiple medications including Risperdal, Invega, Zyprexa, Latuda, and Saphris. He does not remember ever being on Seroquel or Geodon. He was tried on Depakote but not tolerated. He has never taken lithium or Tegretol. In the past he was treated with SSRIs for anxiety. He was given Klonopin in the past.  Family psychiatric history. Grandmother with depression.  Social history. He grew up in [place], [place]. He graduated from high school. He denies any history of abuse. He has been a resident of multiple group homes. Apparently he recently moved into independent, supervised apartment in [place]. He sees Dr. [facility]jean [facility] at [facility] for mental health and Dr. Lacie Scotts as his primary doctor. [facility]  Principal Problem: Undifferentiated schizophrenia [facility]) Discharge Diagnoses: Patient Active Problem List   Diagnosis Date Noted  . Undifferentiated schizophrenia (HCC) [F20.3] 01/27/2016  . Tobacco use disorder [F17.200] 01/27/2016  . Suicidal ideation [R45.851]     Past Psychiatric History: Schizophrenia.  Past Medical History:  Past Medical History  Diagnosis Date  . Schizophrenia (HCC)   . Anxiety    History reviewed. No pertinent past surgical history. Family History: History reviewed. No pertinent family history. Family Psychiatric  History: Grandmother with depression. Social History:  History  Alcohol Use No     History  Drug Use  No    Social History   Social History  . Marital Status: Single    Spouse Name: N/A  . Number of Children: N/A  . Years of Education: N/A   Social History Main  Topics  . Smoking status: Current Some Day Smoker  . Smokeless tobacco: None  . Alcohol Use: No  . Drug Use: No  . Sexual Activity: Not Asked   Other Topics Concern  . None   Social History Narrative    Hospital Course:    [name] is a 30 year old male with a history of schizophrenia admitted for suicidal ideation and disorganized thinking.   1. Suicidal ideation. This has resolved. The patient is able to contract for safety. He is forward thinking and optimistic about the future.  2. Mood/Psychosis. He has been maintained on 20 mg of Abilify for psychosis and agreed to take Abilify Maintena to improve compliance. The injection was given on 01/31/2016.    3. Headaches. We started Topamax for headache prevention and mood stabilization.  4. Anxiety. He did well on clonazepam in the past that is no longer prescribed by [facility]. We started low dose clonazepam for anxiety.   5. Metabolic syndrome screening. Lipid profile, TSH, and hemoglobin A1c are normal. Prolactin is low.   6. Smoking. Nicotine patch was available.  7. Insomnia. He responded well to Trazodone.  8. Disposition. The patient was discharged to home. [facility] and continue to participate in day program.   Physical Findings: AIMS:  , ,  ,  ,    CIWA:    COWS:     Musculoskeletal: Strength & Muscle Tone: within normal limits Gait & Station: normal Patient leans: N/A  Psychiatric Specialty Exam: Review of Systems  All other systems reviewed and are negative.   Blood pressure 109/62, pulse 67, temperature 97.8 F (36.6 C), temperature source Oral, resp. rate 18, height 6\' 2"  (1.88 m), weight 81.74 kg (180 lb 3.3 oz), SpO2 100 %.Body mass index is 23.13 kg/(m^2).  See SRA.                                                  Sleep:  Number of Hours: 8.5   Have you used any form of tobacco in the last 30 days? (Cigarettes, Smokeless Tobacco, Cigars, and/or Pipes):  No  Has this patient used any form of tobacco in the last 30 days? (Cigarettes, Smokeless Tobacco, Cigars, and/or Pipes) Yes, No  Blood Alcohol level:  Lab Results  Component Value Date   ETH <5 01/26/2016    Metabolic Disorder Labs:  Lab Results  Component Value Date   HGBA1C 4.8 01/27/2016   Lab Results  Component Value Date   PROLACTIN 1.7* 01/27/2016   Lab Results  Component Value Date   CHOL 192 01/27/2016   TRIG 154* 01/27/2016   HDL 61 01/27/2016   CHOLHDL 3.1 01/27/2016   VLDL 31 01/27/2016   LDLCALC 100* 01/27/2016    See Psychiatric Specialty Exam and Suicide Risk Assessment completed by Attending Physician prior to discharge.  Discharge destination:  Home  Is patient on multiple antipsychotic therapies at discharge:  No   Has Patient had three or more failed trials of antipsychotic monotherapy by history:  No  Recommended Plan for Multiple Antipsychotic Therapies: NA  Discharge Instructions  Diet - low sodium heart healthy    Complete by:  As directed      Increase activity slowly    Complete by:  As directed             Medication List    STOP taking these medications        HYDROXYZINE HCL PO      TAKE these medications      Indication   ARIPiprazole 20 MG tablet  Commonly known as:  ABILIFY  Take 1 tablet (20 mg total) by mouth at bedtime.   Indication:  Schizophrenia     ARIPiprazole 400 MG Susr  Inject 400 mg into the muscle every 28 (twenty-eight) days.   Indication:  Schizophrenia     clonazePAM 0.5 MG tablet  Commonly known as:  KLONOPIN  Take 1 tablet (0.5 mg total) by mouth 3 (three) times daily before meals.   Indication:  Panic Disorder     topiramate 100 MG tablet  Commonly known as:  TOPAMAX  Take 2 tablets (200 mg total) by mouth at bedtime.   Indication:  Migraine Headache     traZODone 100 MG tablet  Commonly known as:  DESYREL  Take 1 tablet (100 mg total) by mouth at bedtime.   Indication:  Trouble Sleeping          Follow-up recommendations:  Activity:  As tolerated. Diet:  Regular. Other:  Keep follow-up appointments.  Comments:    Signed: Kristine Linea, MD 01/31/2016, 11:07 AM

## 2016-01-31 NOTE — Progress Notes (Signed)
Recreation Therapy Notes  INPATIENT RECREATION TR PLAN  Patient Details Name: Edwin Montes MRN: [id number] DOB: August 05, 1986 Today's Date: 01/31/2016  Rec Therapy Plan Is patient appropriate for Therapeutic Recreation?: Yes Treatment times per week: At least once a week TR Treatment/Interventions: 1:1 session, Group participation (Comment) (Appropriate participation in daily recreation therapy tx)  Discharge Criteria Pt will be discharged from therapy if:: Treatment goals are met, Discharged Treatment plan/goals/alternatives discussed and agreed upon by:: Patient/family  Discharge Summary Short term goals set: See Care Plan Short term goals met: Complete Progress toward goals comments: One-to-one attended Which groups?: Coping skills One-to-one attended: Self-esteem, decision making Reason goals not met: N/A Therapeutic equipment acquired: None Reason patient discharged from therapy: Discharge from hospital Pt/family agrees with progress & goals achieved: Yes Date patient discharged from therapy: 01/31/16   Leonette Monarch, LRT/CTRS 01/31/2016, 4:36 PM

## 2016-01-31 NOTE — Plan of Care (Signed)
Problem: [facility] Participation in Recreation Therapeutic Interventions Goal: STG-Patient will demonstrate improved self esteem by identif STG: Self-Esteem - Within 3 treatment sessions, patient will verbalize at least 5 positive affirmation statements in one treatment session to increase self-esteem post d/c.  Outcome: Completed/Met Date Met:  01/31/16 Treatment Session 1; Completed 1 out of 1: At approximately 11:25 am, LRT met with patient in craft room. Patient verbalized 5 positive affirmation statements. Patient reported it felt "great". LRT encouraged patient to continue saying positive affirmation statements. Intervention Used: I Am statements  Leonette Monarch, LRT/CTRS 03.20.17 1:51 pm Goal: STG-Other Recreation Therapy Goal (Specify) STG: Decision Making - Within 3 treatment sessions, patient will verbalize understanding of the decision making charts in one treatment session to increase good decision making skills post d/c.  Outcome: Completed/Met Date Met:  01/31/16 Treatment Session 1; Completed 1 out of 1: At approximately 11:25 am, LRT met with patient in craft room. LRT educated and provided patient on decision making charts. Patient verbalized understanding. LRT encouraged patient to use the charts to help him make better decisions. Intervention Used: Decision Making charts  Leonette Monarch, LRT/CTRS 03.20.17 1:53 pm

## 2016-01-31 NOTE — Progress Notes (Signed)
D: Pt isolates in his room this evening. His affect is blunted. Pt denies SI/HI/AVH at this time. Denies pain. Pt continues to rate anxiety and depression as an 8 out of 10. A: Emotional support and encouragement provided. Medications administered with education. q15 minute safety checks maintained. R: Pt remains free from harm. Will continue to monitor.

## 2016-01-31 NOTE — [facility] Group Notes (Signed)
[facility] Group Notes:  (Nursing/MHT/Case Management/Adjunct)  Date:  01/31/2016  Time:  5:33 AM  Type of Therapy:  Group Therapy  Participation Level:  Active  Participation Quality:  Appropriate  Affect:  Appropriate  Cognitive:  Appropriate  Insight:  Appropriate  Engagement in Group:  Engaged  Modes of Intervention:  n/a  Summary of Progress/Problems:  Edwin Montes 01/31/2016, 5:33 AM

## 2016-01-31 NOTE — [facility] Suicide Risk Assessment (Signed)
[facility]   Principal Problem: Undifferentiated schizophrenia [facility](HCC) Discharge Diagnoses:  Patient Active Problem List   Diagnosis Date Noted  . Undifferentiated schizophrenia (HCC) [F20.3] 01/27/2016  . Tobacco use disorder [F17.200] 01/27/2016  . Suicidal ideation [R45.851]     Total Time spent with patient: 30 minutes  Musculoskeletal: Strength & Muscle Tone: within normal limits Gait & Station: normal Patient leans: N/A  Psychiatric Specialty Exam: Review of Systems  All other systems reviewed and are negative.   Blood pressure 109/62, pulse 67, temperature 97.8 F (36.6 C), temperature source Oral, resp. rate 18, height 6\' 2"  (1.88 m), weight 81.74 kg (180 lb 3.3 oz), SpO2 100 %.Body mass index is 23.13 kg/(m^2).  General Appearance: Casual  Eye Contact::  Good  Speech:  Clear and Coherent409  Volume:  Normal  Mood:  Euthymic  Affect:  Blunt  Thought Process:  Goal Directed  Orientation:  Full (Time, Place, and Person)  Thought Content:  WDL  Suicidal Thoughts:  No  Homicidal Thoughts:  No  Memory:  Immediate;   Fair Recent;   Fair Remote;   Fair  Judgement:  Fair  Insight:  Fair  Psychomotor Activity:  Normal  Concentration:  Fair  Recall:  [employer]Fair  Fund of Knowledge:Fair  Language: Fair  Akathisia:  No  Handed:  Right  AIMS (if indicated):     Assets:  Communication Skills Desire for Improvement Financial Resources/Insurance Housing Physical Health Resilience Social Support  Sleep:  Number of Hours: 8.5  Cognition: WNL  ADL's:  Intact   Mental Status Per Nursing Assessment::   On Admission:     Demographic Factors:  Male, Adolescent or young adult, Caucasian and Living alone  Loss Factors: NA  Historical Factors: Prior suicide attempts and Impulsivity  Risk Reduction Factors:   Sense of responsibility to family, Positive social support and Positive therapeutic relationship  Continued Clinical Symptoms:  Severe  Anxiety and/or Agitation Schizophrenia:   Less than 28 years old Paranoid or undifferentiated type  Cognitive Features That Contribute To Risk:  None    Suicide Risk:  Minimal: No identifiable suicidal ideation.  Patients presenting with no risk factors but with morbid ruminations; may be classified as minimal risk based on the severity of the depressive symptoms    Plan Of Care/Follow-up recommendations:  Activity:  As tolerated. Diet:  Regular. Other:  Keep follow-up appointments.  Kristine LineaJolanta Pucilowska, MD 01/31/2016, 11:04 AM

## 2016-02-28 ENCOUNTER — Emergency Department
Admission: EM | Admit: 2016-02-28 | Discharge: 2016-02-29 | Disposition: A | Discharge status: Other Acute Inpatient Hospital | Payer: Medicare Other | Attending: Emergency Medicine | Admitting: Emergency Medicine

## 2016-02-28 ENCOUNTER — Encounter: Payer: Self-pay | Admitting: Emergency Medicine

## 2016-02-28 DIAGNOSIS — F203 Undifferentiated schizophrenia: Secondary | ICD-10-CM | POA: Diagnosis not present

## 2016-02-28 DIAGNOSIS — F17200 Nicotine dependence, unspecified, uncomplicated: Secondary | ICD-10-CM | POA: Diagnosis not present

## 2016-02-28 DIAGNOSIS — Z91199 Patient's noncompliance with other medical treatment and regimen due to unspecified reason: Secondary | ICD-10-CM

## 2016-02-28 DIAGNOSIS — R45851 Suicidal ideations: Secondary | ICD-10-CM | POA: Diagnosis present

## 2016-02-28 DIAGNOSIS — Z9119 Patient's noncompliance with other medical treatment and regimen: Secondary | ICD-10-CM

## 2016-02-28 LAB — CBC
HCT: 45.7 % (ref 40.0–52.0)
Hemoglobin: 15.7 g/dL (ref 13.0–18.0)
MCH: 30.7 pg (ref 26.0–34.0)
MCHC: 34.3 g/dL (ref 32.0–36.0)
MCV: 89.7 fL (ref 80.0–100.0)
Platelets: 291 K/uL (ref 150–440)
RBC: 5.09 MIL/uL (ref 4.40–5.90)
RDW: 13.4 % (ref 11.5–14.5)
WBC: 7.5 K/uL (ref 3.8–10.6)

## 2016-02-28 LAB — URINE DRUG SCREEN, QUALITATIVE (ARMC ONLY)
Amphetamines, Ur Screen: NOT DETECTED
Barbiturates, Ur Screen: NOT DETECTED
Benzodiazepine, Ur Scrn: NOT DETECTED
Cannabinoid 50 Ng, Ur ~~LOC~~: NOT DETECTED
Cocaine Metabolite,Ur ~~LOC~~: NOT DETECTED
MDMA (Ecstasy)Ur Screen: NOT DETECTED
Methadone Scn, Ur: NOT DETECTED
Opiate, Ur Screen: NOT DETECTED
Phencyclidine (PCP) Ur S: NOT DETECTED
Tricyclic, Ur Screen: NOT DETECTED

## 2016-02-28 LAB — COMPREHENSIVE METABOLIC PANEL WITH GFR
ALT: 22 U/L (ref 17–63)
AST: 30 U/L (ref 15–41)
Albumin: 4.8 g/dL (ref 3.5–5.0)
Alkaline Phosphatase: 64 U/L (ref 38–126)
Anion gap: 7 (ref 5–15)
BUN: 17 mg/dL (ref 6–20)
CO2: 26 mmol/L (ref 22–32)
Calcium: 9.4 mg/dL (ref 8.9–10.3)
Chloride: 104 mmol/L (ref 101–111)
Creatinine, Ser: 1.01 mg/dL (ref 0.61–1.24)
GFR calc Af Amer: >60 mL/min
GFR calc non Af Amer: >60 mL/min
Glucose, Bld: 77 mg/dL (ref 65–99)
Potassium: 3.6 mmol/L (ref 3.5–5.1)
Sodium: 137 mmol/L (ref 135–145)
Total Bilirubin: 0.6 mg/dL (ref 0.3–1.2)
Total Protein: 7.2 g/dL (ref 6.5–8.1)

## 2016-02-28 LAB — ETHANOL: Alcohol, Ethyl (B): <5 mg/dL (ref <5)

## 2016-02-28 LAB — SALICYLATE LEVEL

## 2016-02-28 LAB — ACETAMINOPHEN LEVEL: Acetaminophen (Tylenol), Serum: <10 ug/mL — ABNORMAL LOW (ref 10–30)

## 2016-02-28 NOTE — ED Notes (Signed)
Pt reports to ED via EMS w/ thoughts of hurting himself since yesterday.  Pt denies plan, HI or recent loss.  Pt A/Ox4, ambulatory. NAD.

## 2016-02-28 NOTE — ED Notes (Signed)
Pt bib EMS w/ c/o thoughts of hurting himself since yesterday.  Pt denies plan to hurt self or HI. Ambulatory to room. NAD.

## 2016-02-28 NOTE — ED Notes (Signed)
Patient noted in room. No complaints, stable, in no acute distress. Q15 minute rounds and monitoring via Security Cameras to continue.  

## 2016-02-28 NOTE — BH Assessment (Signed)
Assessment Note  Edwin Montes is an 30 y.o. male. Edwin Montes reports to the ED by way of EMS.  He reports that he was having feelings of harming himself and racing thoughts. He states that he did not want to kill himself, but just harm himself. He shared that he wanted to "scare the living daylights out of myself, by expression. (He described "taking a couple deep breaths in and a couple deep breaths out".  He denied current stressful events. He reports no feelings of depression. He denied anxiety symptoms. He denied auditory or visual hallucinations.  He denied homicidal ideation or intent.  He is currently is on disability for schizophrenia.  He resides alone. He denied the use of alcohol or drugs.  He reports being isolated and to himself lately.  He currently attends a day program at [employer] [facility] [employer].  Diagnosis: Schizophrenia  Past Medical History:  Past Medical History  Diagnosis Date  . Schizophrenia (HCC)   . Anxiety     History reviewed. No pertinent past surgical history.  Family History: No family history on file.  Social History:  reports that he has been smoking.  He does not have any smokeless tobacco history on file. He reports that he does not drink alcohol or use illicit drugs.  Additional Social History:  Alcohol / Drug Use History of alcohol / drug use?: No history of alcohol / drug abuse  CIWA: CIWA-Ar BP: 125/85 mmHg Pulse Rate: 99 COWS:    Allergies: No Known Allergies  Home Medications:  (Not in a hospital admission)  OB/GYN Status:  No LMP for male patient.  General Assessment Data Location of Assessment: [facility] ED TTS Assessment: In system Is this a Tele or Face-to-Face Assessment?: Face-to-Face Is this an Initial Assessment or a Re-assessment for this encounter?: Initial Assessment Marital status: Single Maiden name: n/a Is patient pregnant?: No Pregnancy Status: No Living Arrangements: Alone Can pt return to current living  arrangement?: Yes Admission Status: Voluntary Is patient capable of signing voluntary admission?: Yes Referral Source: Self/Family/Friend Insurance type: Medicare  Medical Screening Exam [facility] Walk-in ONLY) Medical Exam completed: Yes  Crisis Care Plan Living Arrangements: Alone Legal Guardian: Other: (Self) Name of Psychiatrist: [facility] Name of Therapist: denied having a therapist  Education Status Is patient currently in school?: No Current Grade: n/a Highest grade of school patient has completed: some college Name of school: [employer] Contact person: n/a  Risk to self with the past 6 months Suicidal Ideation: No Has patient been a risk to self within the past 6 months prior to admission? : No Suicidal Intent: No Has patient had any suicidal intent within the past 6 months prior to admission? : No Is patient at risk for suicide?: No Suicidal Plan?: No Has patient had any suicidal plan within the past 6 months prior to admission? : No Access to Means: No What has been your use of drugs/alcohol within the last 12 months?: Denied use Previous Attempts/Gestures: No How many times?: 0 Other Self Harm Risks: denied Triggers for Past Attempts: None known Intentional Self Injurious Behavior: None Family Suicide History: No Recent stressful life event(s):  (Denied any stressors) Persecutory voices/beliefs?: No Depression: Yes (Patient appears depressed and flat, though he denies symptom) Depression Symptoms: Despondent Substance abuse history and/or treatment for substance abuse?: No Suicide prevention information given to non-admitted patients: Not applicable  Risk to Others within the past 6 months Homicidal Ideation: No Does patient have any lifetime risk of  violence toward others beyond the six months prior to admission? : No Thoughts of Harm to Others: No Current Homicidal Intent: No Current Homicidal Plan: No Access to Homicidal Means:  No Identified Victim: None identified History of harm to others?: No Assessment of Violence: None Noted Violent Behavior Description: denied Does patient have access to weapons?: No Criminal Charges Pending?: No Does patient have a court date: No Is patient on probation?: No  Psychosis Hallucinations: None noted Delusions: None noted  Mental Status Report Appearance/Hygiene: In scrubs Eye Contact: Poor (Staring at the wall, not blinking) Motor Activity: Unremarkable Speech: Soft, Pressured Level of Consciousness: Quiet/awake Mood: Sullen Affect: Flat Anxiety Level: None Thought Processes: Coherent Judgement: Unimpaired Orientation: Person, Place, Time, Situation Obsessive Compulsive Thoughts/Behaviors: None  Cognitive Functioning Concentration: Normal Memory: Recent Intact IQ: Average Insight: Fair Impulse Control: Fair Appetite: Good Sleep: No Change Vegetative Symptoms: None (None reported)  ADLScreening [facility] Assessment Services) Patient's cognitive ability adequate to safely complete daily activities?: Yes Patient able to express need for assistance with ADLs?: Yes Independently performs ADLs?: Yes (appropriate for developmental age)  Prior Inpatient Therapy Prior Inpatient Therapy: Yes Prior Therapy Dates: 2014, 2012 , 2009 [facility] [place], [place] Reason for Treatment: Schizophrenia, depression  Prior Outpatient Therapy Prior Outpatient Therapy: Yes Prior Therapy Dates: current Prior Therapy Facilty/Provider(s): [facility] Reason for Treatment: Depression, Schizophrenia Does patient have an ACCT team?: No Does patient have Intensive In-House Services?  : No Does patient have [facility] services? : No Does patient have P4CC services?: No  ADL Screening (condition at time of admission) Patient's cognitive ability adequate to safely complete daily activities?: Yes Patient able to express need for  assistance with ADLs?: Yes Independently performs ADLs?: Yes (appropriate for developmental age)       Abuse/Neglect Assessment (Assessment to be complete while patient is alone) Physical Abuse: Denies Verbal Abuse: Denies Sexual Abuse: Denies Exploitation of patient/patient's resources: Denies Self-Neglect: Denies Values / Beliefs Cultural Requests During Hospitalization: None        Additional Information 1:1 In Past 12 Months?: No CIRT Risk: No Elopement Risk: No Does patient have medical clearance?: Yes     Disposition:     On Site Evaluation by:   Reviewed with Physician:    Justice DeedsKeisha Tayden Duran 02/28/2016 8:47 PM

## 2016-02-28 NOTE — ED Notes (Signed)
Pt. Transferred to [facility] from ED to room 4. [facility]. Pt. Oriented to unit including Q15 minute rounds as well as the security cameras for their protection. Patient is alert and oriented, warm and dry in no acute distress. Patient denies SI, HI, and AVH. Pt. Encouraged to let me know if needs arise.

## 2016-02-28 NOTE — ED Provider Notes (Signed)
[facility] Emergency Department Provider Note  Time seen: 7:50 PM  I have reviewed the triage vital signs and the nursing notes.   HISTORY  Chief Complaint Suicidal    HPI Garrel RidgelCameron Bechard is a 30 y.o. male with a past medical history of depression and anxiety, schizophrenia, presents the emergency department with thoughts of burning himself. According to the patient since yesterday he has been having racing thoughts including thoughts of hurting himself. Denies any specific plan. Denies any acute stressor. Denies any medical complaints. Denies any drug or alcohol use.     Past Medical History  Diagnosis Date  . Schizophrenia (HCC)   . Anxiety     Patient Active Problem List   Diagnosis Date Noted  . 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 Tobacco use disorder 01/27/2016  . Suicidal ideation     History reviewed. No pertinent past surgical history.  Current Outpatient Rx  Name  Route  Sig  Dispense  Refill  . ARIPiprazole (ABILIFY) 20 MG tablet   Oral   Take 1 tablet (20 mg total) by mouth at bedtime.   30 tablet   0   . ARIPiprazole 400 MG SUSR   Intramuscular   Inject 400 mg into the muscle every 28 (twenty-eight) days.   1 each   1   . clonazePAM (KLONOPIN) 0.5 MG tablet   Oral   Take 1 tablet (0.5 mg total) by mouth 3 (three) times daily before meals.   90 tablet   0   . topiramate (TOPAMAX) 100 MG tablet   Oral   Take 2 tablets (200 mg total) by mouth at bedtime.   30 tablet   0   . traZODone (DESYREL) 100 MG tablet   Oral   Take 1 tablet (100 mg total) by mouth at bedtime.   30 tablet   0     Allergies Review of patient's allergies indicates no known allergies.  No family history on file.  Social History Social History  Substance Use Topics  . Smoking status: Current Some Day Smoker  . Smokeless tobacco: None  . Alcohol Use: No    Review of Systems Constitutional: Negative for  fever. Cardiovascular: Negative for chest pain. Respiratory: Negative for shortness of breath. Gastrointestinal: Negative for abdominal pain Genitourinary: Negative for dysuria. Musculoskeletal: Negative for back pain Neurological: Negative for headache 10-point ROS otherwise negative.  ____________________________________________   PHYSICAL EXAM:  VITAL SIGNS: ED Triage Vitals  Enc Vitals Group     BP 02/28/16 1928 125/85 mmHg     Pulse Rate 02/28/16 1928 99     Resp 02/28/16 1928 16     Temp 02/28/16 1928 97.9 F (36.6 C)     Temp Source 02/28/16 1928 Oral     SpO2 02/28/16 1928 100 %     Weight 02/28/16 1928 178 lb (80.74 kg)     Height 02/28/16 1928 6\' 2"  (1.88 m)     Head Cir --      Peak Flow --      Pain Score --      Pain Loc --      Pain Edu? --      Excl. in GC? --     Constitutional: Alert and oriented. Well appearing and in no distress. Eyes: Normal exam ENT   Head: Normocephalic and atraumatic   Mouth/Throat: Mucous membranes are moist. Cardiovascular: Normal rate, regular rhythm. No murmurs, rubs, or gallops. Respiratory: Normal respiratory effort without tachypnea nor  retractions. Breath sounds are clear Gastrointestinal: Soft and nontender. No distention.   Musculoskeletal: Nontender with normal range of motion in all extremities.  Neurologic:  Normal speech and language. No gross focal neurologic deficits Skin:  Skin is warm, dry and intact.  Psychiatric: Admits thoughts of self-harm.  ____________________________________________    INITIAL IMPRESSION / ASSESSMENT AND PLAN / ED COURSE  Pertinent labs & imaging results that were available during my care of the patient were reviewed by me and considered in my medical decision making (see chart for details).  Patient presents the emergency department with thoughts of hurting himself. No medical complaints. Normal physical exam. We'll place the patient under an involuntary commitment, check  labs, and have psychiatry see the patient.  Labs are within normal limits. Currently awaiting psychiatric disposition.  ____________________________________________   FINAL CLINICAL IMPRESSION(S) / ED DIAGNOSES  Suicidal thoughts   Minna Antis, MD 02/28/16 2342

## 2016-02-29 ENCOUNTER — Inpatient Hospital Stay
Admission: EM | Admit: 2016-02-29 | Discharge: 2016-03-02 | DRG: 885 | Disposition: A | Discharge status: Home / Self Care | Payer: Medicare Other | Source: Intra-hospital | Attending: Psychiatry | Admitting: Psychiatry

## 2016-02-29 DIAGNOSIS — F17210 Nicotine dependence, cigarettes, uncomplicated: Secondary | ICD-10-CM | POA: Diagnosis present

## 2016-02-29 DIAGNOSIS — F203 Undifferentiated schizophrenia: Secondary | ICD-10-CM | POA: Diagnosis present

## 2016-02-29 DIAGNOSIS — Z91199 Patient's noncompliance with other medical treatment and regimen due to unspecified reason: Secondary | ICD-10-CM

## 2016-02-29 DIAGNOSIS — Z23 Encounter for immunization: Secondary | ICD-10-CM

## 2016-02-29 DIAGNOSIS — R45851 Suicidal ideations: Secondary | ICD-10-CM

## 2016-02-29 DIAGNOSIS — Z9119 Patient's noncompliance with other medical treatment and regimen: Secondary | ICD-10-CM | POA: Diagnosis not present

## 2016-02-29 DIAGNOSIS — Z818 Family history of other mental and behavioral disorders: Secondary | ICD-10-CM

## 2016-02-29 DIAGNOSIS — F17200 Nicotine dependence, unspecified, uncomplicated: Secondary | ICD-10-CM | POA: Diagnosis present

## 2016-02-29 DIAGNOSIS — G4700 Insomnia, unspecified: Secondary | ICD-10-CM | POA: Diagnosis present

## 2016-02-29 MED ORDER — CLONAZEPAM 0.5 MG PO TABS
0.5000 mg | ORAL_TABLET | Freq: Three times a day (TID) | ORAL | Status: DC
  Administered 2016-02-29: 0.5 mg via ORAL
  Filled 2016-02-29: qty 1

## 2016-02-29 MED ORDER — ALUM & MAG HYDROXIDE-SIMETH 200-200-20 MG/5ML PO SUSP: 30.0000 mL | ORAL | Status: DC | PRN

## 2016-02-29 MED ORDER — ARIPIPRAZOLE ER 400 MG IM SUSR
400.0000 mg | Freq: Once | INTRAMUSCULAR | Status: DC
  Filled 2016-02-29: qty 400

## 2016-02-29 MED ORDER — ACETAMINOPHEN 325 MG PO TABS: 650.0000 mg | ORAL_TABLET | Freq: Four times a day (QID) | ORAL | Status: DC | PRN

## 2016-02-29 MED ORDER — PNEUMOCOCCAL VAC POLYVALENT 25 MCG/0.5ML IJ INJ
0.5000 mL | INJECTION | INTRAMUSCULAR | Status: AC
  Administered 2016-03-01: 0.5 mL via INTRAMUSCULAR
  Filled 2016-02-29: qty 0.5

## 2016-02-29 MED ORDER — TOPIRAMATE 100 MG PO TABS
200.0000 mg | ORAL_TABLET | Freq: Every day | ORAL | Status: DC
  Filled 2016-02-29: qty 2

## 2016-02-29 MED ORDER — ARIPIPRAZOLE ER 400 MG IM SUSR
400.0000 mg | Freq: Once | INTRAMUSCULAR | Status: DC
  Administered 2016-02-29: 400 mg via INTRAMUSCULAR
  Filled 2016-02-29: qty 400

## 2016-02-29 MED ORDER — ARIPIPRAZOLE 10 MG PO TABS: 20.0000 mg | ORAL_TABLET | Freq: Every day | ORAL | Status: DC

## 2016-02-29 MED ORDER — TOPIRAMATE 100 MG PO TABS
200.0000 mg | ORAL_TABLET | Freq: Every day | ORAL | Status: DC
  Administered 2016-02-29 – 2016-03-01 (×2): 200 mg via ORAL
  Filled 2016-02-29 (×2): qty 2

## 2016-02-29 MED ORDER — CLONAZEPAM 0.5 MG PO TABS
0.5000 mg | ORAL_TABLET | Freq: Three times a day (TID) | ORAL | Status: DC
  Administered 2016-02-29 – 2016-03-02 (×5): 0.5 mg via ORAL
  Filled 2016-02-29 (×5): qty 1

## 2016-02-29 MED ORDER — ARIPIPRAZOLE 10 MG PO TABS
20.0000 mg | ORAL_TABLET | Freq: Every day | ORAL | Status: DC
  Administered 2016-02-29 – 2016-03-01 (×2): 20 mg via ORAL
  Filled 2016-02-29 (×4): qty 2

## 2016-02-29 MED ORDER — MAGNESIUM HYDROXIDE 400 MG/5ML PO SUSP
30.0000 mL | Freq: Every day | ORAL | Status: DC | PRN
  Administered 2016-02-29: 30 mL via ORAL
  Filled 2016-02-29: qty 30

## 2016-02-29 MED ORDER — MAGNESIUM HYDROXIDE 400 MG/5ML PO SUSP: 30.0000 mL | Freq: Every day | ORAL | Status: DC | PRN

## 2016-02-29 NOTE — ED Notes (Signed)
Patient noted sleeping in room. No complaints, stable, in no acute distress. Q15 minute rounds and monitoring via Security Cameras to continue.  

## 2016-02-29 NOTE — ED Notes (Signed)
It was explained to pt that he was going to be adm to the hospital and he is agreeable with the plan

## 2016-02-29 NOTE — ED Notes (Signed)
Report to oncoming shift.

## 2016-02-29 NOTE — Consult Note (Signed)
[name] Consult   Reason for Consult:  Consult for 30 year old man with a history of schizophrenia who comes back to the emergency room saying he is having racing thoughts and hallucinations Referring Physician:  Edd Fabian Patient Identification: Edwin Montes MRN:  [id number] Principal Diagnosis: Schizophrenia [facility]) Diagnosis:   Patient Active Problem List   Diagnosis Date Noted  . Schizophrenia ([place]) [F20.9] 02/29/2016  . Noncompliance [Z91.19] 02/29/2016  . Undifferentiated schizophrenia ([place]) [F20.3] 01/27/2016  . Tobacco use disorder [F17.200] 01/27/2016  . Suicidal ideation [R45.851]     Total Time spent with patient: 1 hour  Subjective:   Edwin Montes is a 30 y.o. male patient admitted with "I'm hearing voices again".  HPI:  Patient states that he is having auditory hallucinations that started back yesterday. Sometimes he can understand them but usually it doesn't make any sense. He also says he has had racing thoughts. He has made some statements about having suicidal thoughts but the details of this are rather odd. He repeated a couple of times that he thought that he should take several deep breaths. The first time he said this he indicated that this was a suicidal thought at other times he seems confused by it. Patient said that he had been compliant with his medications but later clarifies that he tapered off of them about a week ago. He says he has been going to the day program in [place] but has not been to see any outpatient follow-up and has not seen a psychiatrist. He has not gotten a repeat injection of his long-acting medicine. He admits that he uses marijuana occasionally still. Not drinking. He lives by himself. Sounds like he has relatively little support. He can't identify any specific psychosocial stress that might of upset him. He does not report homicidal ideation.  Social history: Patient lives by himself in an apartment. Sounds like he doesn't  have very much close contact with family or anyone else. He does say that he goes to a day program but the way he describes it seems a little vague.  Medical history: Patient has no significant medical problem outside of his history of schizophrenia.  Substance abuse history: History of abuse of marijuana. Occasional beer. Substances don't seem to of been the major issue.  Past Psychiatric History: Patient has had prior hospitalizations and in fact was just here in our hospital about a month ago. He was stabilized at that time on Abilify and given a long-acting injectable medicine but has not followed up with outpatient treatment. Doesn't have a known history of actually trying to kill himself. No history of violence to others.  Risk to Self: Suicidal Ideation: No Suicidal Intent: No Is patient at risk for suicide?: No Suicidal Plan?: No Access to Means: No What has been your use of drugs/alcohol within the last 12 months?: Denied use How many times?: 0 Other Self Harm Risks: denied Triggers for Past Attempts: None known Intentional Self Injurious Behavior: None Risk to Others: Homicidal Ideation: No Thoughts of Harm to Others: No Current Homicidal Intent: No Current Homicidal Plan: No Access to Homicidal Means: No Identified Victim: None identified History of harm to others?: No Assessment of Violence: None Noted Violent Behavior Description: denied Does patient have access to weapons?: No Criminal Charges Pending?: No Does patient have a court date: No Prior Inpatient Therapy: Prior Inpatient Therapy: Yes Prior Therapy Dates: 2014, 2012 , 2009 Prior Therapy Facilty/Provider(s): [facility], [facility], [facility], [facility] Reason for Treatment: Schizophrenia, depression  Prior Outpatient Therapy: Prior Outpatient Therapy: Yes Prior Therapy Dates: current Prior Therapy Facilty/Provider(s): [facility] Reason for Treatment: Depression, Schizophrenia Does patient have an ACCT  team?: No Does patient have Intensive In-House Services?  : No Does patient have [facility] services? : No Does patient have P4CC services?: No  Past Medical History:  Past Medical History  Diagnosis Date  . Schizophrenia ([place])   . Anxiety    History reviewed. No pertinent past surgical history. Family History: No family history on file. Family Psychiatric  History: Patient says he does not know of any family history of mental illness Social History:  History  Alcohol Use No     History  Drug Use No    Social History   Social History  . Marital Status: Single    Spouse Name: N/A  . Number of Children: N/A  . Years of Education: N/A   Social History Main Topics  . Smoking status: Current Some Day Smoker  . Smokeless tobacco: None  . Alcohol Use: No  . Drug Use: No  . Sexual Activity: Not Asked   Other Topics Concern  . None   Social History Narrative   Additional Social History:    Allergies:  No Known Allergies  Labs:  Results for orders placed or performed during the hospital encounter of 02/28/16 (from the past 48 hour(s))  Comprehensive metabolic panel     Status: None   Collection Time: 02/28/16  7:31 PM  Result Value Ref Range   Sodium 137 135 - 145 mmol/L   Potassium 3.6 3.5 - 5.1 mmol/L   Chloride 104 101 - 111 mmol/L   CO2 26 22 - 32 mmol/L   Glucose, Bld 77 65 - 99 mg/dL   BUN 17 6 - 20 mg/dL   Creatinine, Ser 1.01 0.61 - 1.24 mg/dL   Calcium 9.4 8.9 - 10.3 mg/dL   Total Protein 7.2 6.5 - 8.1 g/dL   Albumin 4.8 3.5 - 5.0 g/dL   AST 30 15 - 41 U/L   ALT 22 17 - 63 U/L   Alkaline Phosphatase 64 38 - 126 U/L   Total Bilirubin 0.6 0.3 - 1.2 mg/dL   GFR calc non Af Amer >60 >60 mL/min   GFR calc Af Amer >60 >60 mL/min    Comment: (NOTE) The eGFR has been calculated using the CKD EPI equation. This calculation has not been validated in all clinical situations. eGFR's persistently <60 mL/min signify possible Chronic Kidney Disease.    Anion gap 7  5 - 15  Ethanol (ETOH)     Status: None   Collection Time: 02/28/16  7:31 PM  Result Value Ref Range   Alcohol, Ethyl (B) <5 <5 mg/dL    Comment:        LOWEST DETECTABLE LIMIT FOR SERUM ALCOHOL IS 5 mg/dL FOR MEDICAL PURPOSES ONLY   Salicylate level     Status: None   Collection Time: 02/28/16  7:31 PM  Result Value Ref Range   Salicylate Lvl <3.3 2.8 - 30.0 mg/dL  Acetaminophen level     Status: Abnormal   Collection Time: 02/28/16  7:31 PM  Result Value Ref Range   Acetaminophen (Tylenol), Serum <10 (L) 10 - 30 ug/mL    Comment:        THERAPEUTIC CONCENTRATIONS VARY SIGNIFICANTLY. A RANGE OF 10-30 ug/mL MAY BE AN EFFECTIVE CONCENTRATION FOR MANY PATIENTS. HOWEVER, SOME ARE BEST TREATED AT CONCENTRATIONS OUTSIDE THIS RANGE. ACETAMINOPHEN CONCENTRATIONS >150 ug/mL AT 4 HOURS  AFTER INGESTION AND >50 ug/mL AT 12 HOURS AFTER INGESTION ARE OFTEN ASSOCIATED WITH TOXIC REACTIONS.   CBC     Status: None   Collection Time: 02/28/16  7:31 PM  Result Value Ref Range   WBC 7.5 3.8 - 10.6 K/uL   RBC 5.09 4.40 - 5.90 MIL/uL   Hemoglobin 15.7 13.0 - 18.0 g/dL   HCT 45.7 40.0 - 52.0 %   MCV 89.7 80.0 - 100.0 fL   MCH 30.7 26.0 - 34.0 pg   MCHC 34.3 32.0 - 36.0 g/dL   RDW 13.4 11.5 - 14.5 %   Platelets 291 150 - 440 K/uL  Urine Drug Screen, Qualitative ([facility] only)     Status: None   Collection Time: 02/28/16  7:31 PM  Result Value Ref Range   Tricyclic, Ur Screen NONE DETECTED NONE DETECTED   Amphetamines, Ur Screen NONE DETECTED NONE DETECTED   MDMA (Ecstasy)Ur Screen NONE DETECTED NONE DETECTED   Cocaine Metabolite,Ur [place] NONE DETECTED NONE DETECTED   Opiate, Ur Screen NONE DETECTED NONE DETECTED   Phencyclidine (PCP) Ur S NONE DETECTED NONE DETECTED   Cannabinoid 50 Ng, Ur [place] NONE DETECTED NONE DETECTED   Barbiturates, Ur Screen NONE DETECTED NONE DETECTED   Benzodiazepine, Ur Scrn NONE DETECTED NONE DETECTED   Methadone Scn, Ur NONE DETECTED NONE DETECTED    Comment:  (NOTE) 914  Tricyclics, urine               Cutoff 1000 ng/mL 200  Amphetamines, urine             Cutoff 1000 ng/mL 300  MDMA (Ecstasy), urine           Cutoff 500 ng/mL 400  Cocaine Metabolite, urine       Cutoff 300 ng/mL 500  Opiate, urine                   Cutoff 300 ng/mL 600  Phencyclidine (PCP), urine      Cutoff 25 ng/mL 700  Cannabinoid, urine              Cutoff 50 ng/mL 800  Barbiturates, urine             Cutoff 200 ng/mL 900  Benzodiazepine, urine           Cutoff 200 ng/mL 1000 Methadone, urine                Cutoff 300 ng/mL 1100 1200 The urine drug screen provides only a preliminary, unconfirmed 1300 analytical test result and should not be used for non-medical 1400 purposes. Clinical consideration and professional judgment should 1500 be applied to any positive drug screen result due to possible 1600 interfering substances. A more specific alternate chemical method 1700 must be used in order to obtain a confirmed analytical result.  1800 Gas chromato graphy / mass spectrometry (GC/MS) is the preferred 1900 confirmatory method.     Current Facility-Administered Medications  Medication Dose Route Frequency Provider Last Rate Last Dose  . acetaminophen (TYLENOL) tablet 650 mg  650 mg Oral Q6H PRN Gonzella Lex, MD      . alum & mag hydroxide-simeth (MAALOX/MYLANTA) 200-200-20 MG/5ML suspension 30 mL  30 mL Oral Q4H PRN Gonzella Lex, MD      . ARIPiprazole (ABILIFY) tablet 20 mg  20 mg Oral QHS Gonzella Lex, MD      . ARIPiprazole SUSR 400 mg  400 mg Intramuscular ONCE-1800 Gonzella Lex, MD      .  clonazePAM (KLONOPIN) tablet 0.5 mg  0.5 mg Oral TID Gonzella Lex, MD      . magnesium hydroxide (MILK OF MAGNESIA) suspension 30 mL  30 mL Oral Daily PRN Gonzella Lex, MD      . topiramate (TOPAMAX) tablet 200 mg  200 mg Oral QHS Gonzella Lex, MD       Current Outpatient Prescriptions  Medication Sig Dispense Refill  . ARIPiprazole (ABILIFY) 20 MG tablet Take  1 tablet (20 mg total) by mouth at bedtime. 30 tablet 0  . ARIPiprazole 400 MG SUSR Inject 400 mg into the muscle every 28 (twenty-eight) days. 1 each 1  . clonazePAM (KLONOPIN) 0.5 MG tablet Take 1 tablet (0.5 mg total) by mouth 3 (three) times daily before meals. 90 tablet 0  . topiramate (TOPAMAX) 100 MG tablet Take 2 tablets (200 mg total) by mouth at bedtime. 30 tablet 0  . traZODone (DESYREL) 100 MG tablet Take 1 tablet (100 mg total) by mouth at bedtime. 30 tablet 0    Musculoskeletal: Strength & Muscle Tone: within normal limits Gait & Station: normal Patient leans: N/A  Psychiatric Specialty Exam: Review of Systems  Constitutional: Negative.   HENT: Negative.   Eyes: Negative.   Respiratory: Negative.   Cardiovascular: Negative.   Gastrointestinal: Negative.   Musculoskeletal: Negative.   Skin: Negative.   Neurological: Negative.   Psychiatric/Behavioral: Positive for depression, suicidal ideas, hallucinations and substance abuse. Negative for memory loss. The patient is nervous/anxious. The patient does not have insomnia.     Blood pressure 123/54, pulse 67, temperature 98.2 F (36.8 C), temperature source Oral, resp. rate 16, height '6\' 2"'  (1.88 m), weight 80.74 kg (178 lb), SpO2 100 %.Body mass index is 22.84 kg/(m^2).  General Appearance: Disheveled  Eye Contact::  Minimal  Speech:  Slow  Volume:  Decreased  Mood:  Dysphoric  Affect:  Constricted  Thought Process:  Loose and Tangential  Orientation:  Full (Time, Place, and Person)  Thought Content:  Delusions and Hallucinations: Auditory  Suicidal Thoughts:  Yes.  without intent/plan  Homicidal Thoughts:  No  Memory:  Immediate;   Good Recent;   Fair Remote;   Fair  Judgement:  Impaired  Insight:  Shallow  Psychomotor Activity:  Decreased  Concentration:  Fair  Recall:  [employer] of Knowledge:Fair  Language: Fair  Akathisia:  No  Handed:  Right  AIMS (if indicated):     Assets:  Desire for  Improvement Housing Physical Health  ADL's:  Intact  Cognition: Impaired,  Mild  Sleep:      Treatment Plan Summary: Daily contact with patient to assess and evaluate symptoms and progress in treatment, Medication management and Plan 30 year old man with schizophrenia. Although he is not currently acting aggressively he is having psychotic symptoms and he has shown a failure to comply with treatment this last month. He doesn't have anyone supervising him and has minimal support. He is showing evidence of thought disorder paranoia and hallucinations. Overall the safest course is to admit him back to the psychiatric hospital for restabilization. Patient is agreeable to this. He will be started back on his oral Abilify and I'm also put in an order for them to get his injectable 400 mg this afternoon. Continue the Klonopin and Topamax he was taking previously. Review plan with patient and emergency room physician. He does not need new labs checked I presume because these were probably checked last month follow-up with the treatment team about any  of this.  Disposition: Recommend psychiatric Inpatient admission when medically cleared. Supportive therapy provided about ongoing stressors.  Alethia Berthold, MD 02/29/2016 12:23 PM

## 2016-02-29 NOTE — ED Notes (Signed)
Pt alert and pleasant but very flat affect and seems guarded during eval, he offers no c/o,he was informed of bhu process

## 2016-02-29 NOTE — Progress Notes (Signed)
[facility] by Dr. Toni Amendlapacs.  Attending Physician will be Dr. Jennet MaduroPucilowska.   Patient has been [facility] room [facility]A, [facility] Gwen.   Intake Paper Work has been signed and placed on patient chart.  ER staff is aware of the admission [facility]( [facility].; Dr. Inocencio HomesGayle, ER MD; Irish Lackuthie Patient's Nurse & Byrd HesselbachMaria Patient Access).   02/29/2016 Cheryl FlashNicole Arnell Slivinski, MS, NCC, LPCA Therapeutic Triage Specialist

## 2016-02-29 NOTE — ED Notes (Signed)
Patient noted in room. No complaints, stable, in no acute distress. Q15 minute rounds and monitoring via Security Cameras to continue.  

## 2016-02-29 NOTE — ED Provider Notes (Signed)
-----------------------------------------   8:32 AM on 02/29/2016 -----------------------------------------   Blood pressure 123/54, pulse 67, temperature 98.2 F (36.8 C), temperature source Oral, resp. rate 16, height 6\' 2"  (1.88 m), weight 178 lb (80.74 kg), SpO2 100 %.  The patient had no acute events since last update.  Calm and cooperative at this time. Patient to be evaluated by psych     Rebecka ApleyAllison P Mickaela Starlin, MD 02/29/16 [phone]

## 2016-02-29 NOTE — ED Notes (Signed)
Pt. Alert and oriented, warm and dry, in no distress. Pt. Denies SI, HI, and VH. Pt reports having some AH but they are not as bad as they where. Pt made aware of being transferred to inpatient unit in the [facility]U. Report called and given to [facility] RN. Pt. Encouraged to let nursing staff know of any concerns or needs.

## 2016-02-29 NOTE — Tx Team (Signed)
Initial Interdisciplinary Treatment Plan   PATIENT STRESSORS: Medication change or noncompliance   PATIENT STRENGTHS: Capable of independent living General fund of knowledge Physical Health   PROBLEM LIST: Problem List/Patient Goals Date to be addressed Date deferred Reason deferred Estimated date of resolution  Psychosis 02/29/16     Medication noncompliance 02/29/16     Suicidal thoughts 02/29/16     "getting better prepared for the future" 02/29/16     "try to stay enlightened" 02/29/16                              DISCHARGE CRITERIA:  Improved stabilization in mood, thinking, and/or behavior Motivation to continue treatment in a less acute level of care Need for constant or close observation no longer present Verbal commitment to aftercare and medication compliance  PRELIMINARY DISCHARGE PLAN: Attend aftercare/continuing care group Outpatient therapy  PATIENT/FAMIILY INVOLVEMENT: This treatment plan has been presented to and reviewed with the patient, Edwin Montes, and/or family member.  The patient and family have been given the opportunity to ask questions and make suggestions.  [name] L Parsells 02/29/2016, 9:05 PM

## 2016-02-29 NOTE — ED Notes (Signed)
Explained to pt he might be delayed in getting to his room, he is still calm and cooperative

## 2016-02-29 NOTE — Progress Notes (Signed)
[facility] He is alert and oriented x4. Pt states "I was having racing thoughts so I decided to come here." He reports that he has gone 3 days without taking his medications. Pt denies SI/HI/AVH at this time, although through review of his chart it seems as though he has been experiencing auditory hallucinations recently. He denies pain. Pt affect is flat. He forwards little information and is guarded with assessment questions. Pt reports attending a day program 5 days a week from 9-2:30. He states that he has found it beneficial and plans to continue attending. Pt reports that while here he would like to work on "getting better prepared for the future" and "try to stay enlightened." Skin assessment performed and no contraband found. Pt is noted to have stretch marks on his abdomen and a bruise on his left lower leg. Pt oriented to unit. No concerns or complaints at this time. q15 minute safety checks maintained. Pt remains free from harm. Will continue to monitor.

## 2016-03-01 ENCOUNTER — Encounter: Payer: Self-pay | Admitting: Psychiatry

## 2016-03-01 DIAGNOSIS — F203 Undifferentiated schizophrenia: Secondary | ICD-10-CM | POA: Diagnosis not present

## 2016-03-01 MED ORDER — TRAZODONE HCL 100 MG PO TABS
100.0000 mg | ORAL_TABLET | Freq: Every day | ORAL | Status: DC
  Administered 2016-03-01: 100 mg via ORAL
  Filled 2016-03-01: qty 1

## 2016-03-01 MED ORDER — ARIPIPRAZOLE ER 400 MG IM SUSR
400.0000 mg | INTRAMUSCULAR | Status: DC
  Administered 2016-03-01: 400 mg via INTRAMUSCULAR
  Filled 2016-03-01: qty 400

## 2016-03-01 MED ORDER — NICOTINE 21 MG/24HR TD PT24
21.0000 mg | MEDICATED_PATCH | Freq: Every day | TRANSDERMAL | Status: DC
  Administered 2016-03-01 – 2016-03-02 (×2): 21 mg via TRANSDERMAL
  Filled 2016-03-01 (×2): qty 1

## 2016-03-01 NOTE — [facility] Counselor (Signed)
Adult Comprehensive Assessment  Patient ID: Edwin Montes, male   DOB: 1986/07/23, 30 y.o.   MRN: [id number]  Information Source: Information source: Patient  Current Stressors:  Educational / Learning stressors: None reported  Employment / Job issues: SSI Family Relationships: None reported  Surveyor, quantityinancial / Lack of resources (include bankruptcy): Limited income.  Housing / Lack of housing: Pt lives alone in an apartment. Physical health (include injuries & life threatening diseases):  None reported  Social relationships: Pt goes to a day program.  Substance abuse: Denies use.  Bereavement / Loss: Pt's father died a year ago.   Living/Environment/Situation:  Living Arrangements: Alone Living conditions (as described by patient or guardian): [facility]  How long has patient lived in current situation?: 2 month  What is atmosphere in current home: Comfortable, Supportive  Family History:  Marital status: Single Are you sexually active?: No What is your sexual orientation?: Heterosexual  Has your sexual activity been affected by drugs, alcohol, medication, or emotional stress?: None reported  Does patient have children?: No  Childhood History:  By whom was/is the patient raised?: Both parents Description of patient's relationship with caregiver when they were a child: Good relationship with parents  Patient's description of current relationship with people who raised him/her: Father deined a year ago, good relationship with mother  How were you disciplined when you got in trouble as a child/adolescent?: None reported  Does patient have siblings?: Yes Number of Siblings: 1 Description of patient's current relationship with siblings: 1 sister, close relationship  Did patient suffer any verbal/emotional/physical/sexual abuse as a child?: No Did patient suffer from severe childhood neglect?: No Has patient ever been sexually abused/assaulted/raped as  an adolescent or adult?: No Was the patient ever a victim of a crime or a disaster?: No Witnessed domestic violence?: No Has patient been effected by domestic violence as an adult?: No  Education:  Highest grade of school patient has completed: some Montes Currently a Consulting civil engineerstudent?: No [place] of [employer] [employer] [place] Learning disability?: No  Employment/Work Situation:   Employment situation: On disability Why is patient on disability: Mental health  How long has patient been on disability: 1 year  Patient's job has been impacted by current illness: No What is the longest time patient has a held a job?: 3 years  Where was the patient employed at that time?: [employer]  Has patient ever been in the Eli Lilly and Companymilitary?: No Are There Guns or Other Weapons in Your Home?: No Are These ComptrollerWeapons Safely Secured?:  (n/a)  Financial Resources:   Surveyor, quantityinancial resources: [employer] SSI, Medicaid, Medicare Does patient have a Lawyerrepresentative payee or guardian?: No Name of representative payee or guardian: Mother is payee. Edwin Montes [phone]  Alcohol/Substance Abuse:   What has been your use of drugs/alcohol within the last 12 months?: denies use If attempted suicide, did drugs/alcohol play a role in this?: No Alcohol/Substance Abuse Treatment Hx: Denies past history Has alcohol/substance abuse ever caused legal problems?: No  Social Support System:   Patient's Community Support System: Passenger transport managerGood Describe Community Support System: family, PSR program  Type of faith/religion: NA How does patient's faith help to cope with current illness?: NA   Leisure/Recreation:   Leisure and Hobbies: Sports, playing music   Strengths/Needs:   What things does the patient do well?: building things, jogging  In what areas does patient struggle / problems for patient: feeling down, racing thoughs   Discharge Plan:   Does patient have access to  transportation?: Yes Will patient be returning to same  living situation after discharge?: Yes Currently receiving community mental health services: Yes (From Whom) Does patient have financial barriers related to discharge medications?: No  Summary/Recommendations:   Summary and Recommendations (to be completed by the evaluator): Patient is a single 30 year old male admitted with a diagnosis of Undifferentiated schizophrenia . Patient presented to the hopital with racing thoughts and SI. Patient reports primary triggers for admission was not taking medications and is interested in reconnecting with his outpatient provider and was referred to [facility] [phone] last hospitalization in March 2017. Patient lives in an apartment alone and is in a day program. Patient receives disability and his payee is his mother Edwin Montes [phone]. Patient will benefit from crisis stabilization, medication evaluation, group therapy and psycho education in addition to  case management for discharge planning. At discharge, it is recommended that patient remain compliant with established discharge plan and continued treatment.  Edwin Montes., MSW, [place] [facility]  03/01/2016  [phone]

## 2016-03-01 NOTE — [facility] Group Notes (Signed)
[facility] LCSW Group Therapy  03/01/2016 3:10 PM  Type of Therapy:  Group Therapy  Participation Level:  Did Not Attend  Modes of Intervention:  Discussion, Education, Socialization and Support  Summary of Progress/Problems: Self esteem: Patients discussed self esteem and how it impacts them. They discussed what aspects in their lives has influenced their self esteem. They were challenged to identify changes that are needed in order to improve self esteem.    Ameira Alessandrini L Alesia Oshields MSW, LCSWA  03/01/2016, 3:10 PM   

## 2016-03-01 NOTE — [facility] Group Notes (Signed)
[facility] Group Notes:  (Nursing/MHT/Case Management/Adjunct)  Date:  03/01/2016  Time:  5:27 PM  Type of Therapy:  Psychoeducational Skills  Participation Level:  Did Not Attend    Edwin Montes 03/01/2016, 5:27 PM

## 2016-03-01 NOTE — Progress Notes (Signed)
Recreation Therapy Notes  Date: 04.19.17 Time: 9:30 am Location: Craft Room  Group Topic: Self-esteem  Goal Area(s) Addresses:  Patient will write at least one positive trait. Patient will verbalize benefit of having healthy self-esteem.  Behavioral Response: Did not attend  Intervention: I Am  Activity: Patients were given a worksheet with the letter I on it and instructed to write as many positive traits about themselves inside the letter.  Education: LRT educated patients on ways they can increase their self-esteem.  Education Outcome: Patient did not attend group.  Clinical Observations/Feedback: Patient did not attend group.  Jerrett Baldinger M, LRT/CTRS 03/01/2016 9:53 AM 

## 2016-03-01 NOTE — Progress Notes (Signed)
Patient with depressed affect, withdrawn and sleepy in bed rt new admit. No SI/HI at this time. Minimal interaction with peers. Verbalizes needs with quiet speech, fair eye contact, when nurse initiates interaction. Eats meals in dayroom and returns to bed. Safety maintained.

## 2016-03-01 NOTE — Plan of Care (Signed)
Problem: [facility] Patient Education Outcome: Not Progressing Patient not actively participating in plan of care.

## 2016-03-01 NOTE — H&P (Signed)
Psychiatric Admission Assessment Adult  Patient Identification: Edwin Montes MRN:  [id number] Date of Evaluation:  03/01/2016 Chief Complaint:  schizophrenia Principal Diagnosis: <principal problem not specified> Diagnosis:   Patient Active Problem List   Diagnosis Date Noted  . Noncompliance [Z91.19] 02/29/2016  . Undifferentiated schizophrenia ([place]) [F20.3] 01/27/2016  . Tobacco use disorder [F17.200] 01/27/2016  . Suicidal ideation [R45.851]    History of Present Illness:  Identifying data. [name] is a 30 year old male with history of schizophrenia.  Chief complaint. "I have racing thoughts again."  History of present illness. Information was obtained from the patient and the chart. The patient has a long history of schizophrenia diagnosed about 10 years ago he has been doing exceedingly well on Abilify and graduated from the group home into his own supervised apartment several months ago. He was hospitalized at [facility] the month ago for racing thoughts and suicidal. He was discharged on a combination of oral Abilify and Abilify Maintena. His next injection of maintaining was due on April, 17th. He reports that following discharge from the hospital he did not follow-up with his regular provider Dr. Jamse Montes at [facility] and has not been taking medications as prescribed. For 3 days now, the patient has been feeling increasingly anxious, with racing disorganized thoughts, insomnia and vague thoughts of hurting himself. He denies frank suicidal ideations intentions or plans but feels confused and uncertain. Additional stressor includs the fact that one of the former patient who was hospitalized here has been staying with him for the past couple of days. He no longer has a room mate. He also reports auditory hallucinations but no commands. He does not report paranoia. His anxiety has increased. He reports symptoms of generalized anxiety disorder, infrequent panic attacks and  some symptoms suggestive of OCD with excessive worries, excessive cleaning and organizing. He denies PTSD symptoms. He denies symptoms of depression but admits that when his anxiety gets high he also feels depressed. He denies alcohol or illicit drugs or prescription pill abuse.  Past psychiatric history. The patient is not a good historian and oftentimes gives me conflicting information. Apparently he was diagnosed at the age of 6 and was placed in a group home at that time. He has been a resident of multiple group homes up until recently when he "graduated" and was allowed to live in his own apartment. He reports being hospitalized 7 or 8 times in the past. He denies ever attempting suicide. He cannot explain why he was in the hospital. He believes that he was given diagnosis of bipolar, schizoaffective disorder, schizophrenia, and anxiety. He has been tried on multiple medications including Risperdal, Invega, Zyprexa, Latuda, and Saphris. He does not remember ever being on Seroquel or Geodon. He was tried on Depakote but not tolerated. He has never taken lithium or Tegretol. In the past he was treated with SSRIs for anxiety. He was given Klonopin in the past.  Family psychiatric history. Grandmother with depression.  Social history. He grew up in [place], [place]. He graduated from high school. He denies any history of abuse. He has been a resident of multiple group homes. Apparently he recently moved into independent, supervised apartment in [place]. He sees Dr. Jamse Montes at [facility] for mental health and Dr. Brunetta Montes as his primary doctor. [facility]  Total Time spent with patient: 1 hour  Past Psychiatric History: Schizophrenia.  Is the patient at risk to self? Yes.    Has  the patient been a risk to self in the past 6 months? Yes.    Has the patient been a risk to self within the distant past? No.  Is the patient a risk to others? No.  Has the patient been  a risk to others in the past 6 months? No.  Has the patient been a risk to others within the distant past? No.   Prior Inpatient Therapy:   Prior Outpatient Therapy:    Alcohol Screening: 1. How often do you have a drink containing alcohol?: Never 2. How many drinks containing alcohol do you have on a typical day when you are drinking?: 1 or 2 3. How often do you have six or more drinks on one occasion?: Never Preliminary Score: 0 4. How often during the last year have you found that you were not able to stop drinking once you had started?: Never 5. How often during the last year have you failed to do what was normally expected from you becasue of drinking?: Never 6. How often during the last year have you needed a first drink in the morning to get yourself going after a heavy drinking session?: Never 7. How often during the last year have you had a feeling of guilt of remorse after drinking?: Never 8. How often during the last year have you been unable to remember what happened the night before because you had been drinking?: Never 9. Have you or someone else been injured as a result of your drinking?: No 10. Has a relative or friend or a doctor or another health worker been concerned about your drinking or suggested you cut down?: No Alcohol Use Disorder Identification Test Final Score (AUDIT): 0 Brief Intervention: AUDIT score less than 7 or less-screening does not suggest unhealthy drinking-brief intervention not indicated Substance Abuse History in the last 12 months:  No. Consequences of Substance Abuse: NA Previous Psychotropic Medications: Yes  Psychological Evaluations: No  Past Medical History:  Past Medical History  Diagnosis Date  . Schizophrenia ([place])   . Anxiety     Past Surgical History  Procedure Laterality Date  . No past surgeries     Family History: History reviewed. No pertinent family history. Family Psychiatric  History: Grandmother with depression. Tobacco  Screening: '@FLOW' [phone])::1)@ Social History:  History  Alcohol Use No     History  Drug Use No    Additional Social History:      Pain Medications: denies Prescriptions: see PTA meds Over the Counter: denies History of alcohol / drug use?: No history of alcohol / drug abuse                    Allergies:  No Known Allergies Lab Results:  Results for orders placed or performed during the hospital encounter of 02/28/16 (from the past 48 hour(s))  Comprehensive metabolic panel     Status: None   Collection Time: 02/28/16  7:31 PM  Result Value Ref Range   Sodium 137 135 - 145 mmol/L   Potassium 3.6 3.5 - 5.1 mmol/L   Chloride 104 101 - 111 mmol/L   CO2 26 22 - 32 mmol/L   Glucose, Bld 77 65 - 99 mg/dL   BUN 17 6 - 20 mg/dL   Creatinine, Ser 1.01 0.61 - 1.24 mg/dL   Calcium 9.4 8.9 - 10.3 mg/dL   Total Protein 7.2 6.5 - 8.1 g/dL   Albumin 4.8 3.5 - 5.0 g/dL   AST 30 15 - 41  U/L   ALT 22 17 - 63 U/L   Alkaline Phosphatase 64 38 - 126 U/L   Total Bilirubin 0.6 0.3 - 1.2 mg/dL   GFR calc non Af Amer >60 >60 mL/min   GFR calc Af Amer >60 >60 mL/min    Comment: (NOTE) The eGFR has been calculated using the CKD EPI equation. This calculation has not been validated in all clinical situations. eGFR's persistently <60 mL/min signify possible Chronic Kidney Disease.    Anion gap 7 5 - 15  Ethanol (ETOH)     Status: None   Collection Time: 02/28/16  7:31 PM  Result Value Ref Range   Alcohol, Ethyl (B) <5 <5 mg/dL    Comment:        LOWEST DETECTABLE LIMIT FOR SERUM ALCOHOL IS 5 mg/dL FOR MEDICAL PURPOSES ONLY   Salicylate level     Status: None   Collection Time: 02/28/16  7:31 PM  Result Value Ref Range   Salicylate Lvl <7.6 2.8 - 30.0 mg/dL  Acetaminophen level     Status: Abnormal   Collection Time: 02/28/16  7:31 PM  Result Value Ref Range   Acetaminophen (Tylenol), Serum <10 (L) 10 - 30 ug/mL    Comment:        THERAPEUTIC CONCENTRATIONS  VARY SIGNIFICANTLY. A RANGE OF 10-30 ug/mL MAY BE AN EFFECTIVE CONCENTRATION FOR MANY PATIENTS. HOWEVER, SOME ARE BEST TREATED AT CONCENTRATIONS OUTSIDE THIS RANGE. ACETAMINOPHEN CONCENTRATIONS >150 ug/mL AT 4 HOURS AFTER INGESTION AND >50 ug/mL AT 12 HOURS AFTER INGESTION ARE OFTEN ASSOCIATED WITH TOXIC REACTIONS.   CBC     Status: None   Collection Time: 02/28/16  7:31 PM  Result Value Ref Range   WBC 7.5 3.8 - 10.6 K/uL   RBC 5.09 4.40 - 5.90 MIL/uL   Hemoglobin 15.7 13.0 - 18.0 g/dL   HCT 45.7 40.0 - 52.0 %   MCV 89.7 80.0 - 100.0 fL   MCH 30.7 26.0 - 34.0 pg   MCHC 34.3 32.0 - 36.0 g/dL   RDW 13.4 11.5 - 14.5 %   Platelets 291 150 - 440 K/uL  Urine Drug Screen, Qualitative ([facility] only)     Status: None   Collection Time: 02/28/16  7:31 PM  Result Value Ref Range   Tricyclic, Ur Screen NONE DETECTED NONE DETECTED   Amphetamines, Ur Screen NONE DETECTED NONE DETECTED   MDMA (Ecstasy)Ur Screen NONE DETECTED NONE DETECTED   Cocaine Metabolite,Ur [name] NONE DETECTED NONE DETECTED   Opiate, Ur Screen NONE DETECTED NONE DETECTED   Phencyclidine (PCP) Ur S NONE DETECTED NONE DETECTED   Cannabinoid 50 Ng, Ur [place] NONE DETECTED NONE DETECTED   Barbiturates, Ur Screen NONE DETECTED NONE DETECTED   Benzodiazepine, Ur Scrn NONE DETECTED NONE DETECTED   Methadone Scn, Ur NONE DETECTED NONE DETECTED    Comment: (NOTE) 160  Tricyclics, urine               Cutoff 1000 ng/mL 200  Amphetamines, urine             Cutoff 1000 ng/mL 300  MDMA (Ecstasy), urine           Cutoff 500 ng/mL 400  Cocaine Metabolite, urine       Cutoff 300 ng/mL 500  Opiate, urine                   Cutoff 300 ng/mL 600  Phencyclidine (PCP), urine      Cutoff 25 ng/mL 700  Cannabinoid, urine              Cutoff 50 ng/mL 800  Barbiturates, urine             Cutoff 200 ng/mL 900  Benzodiazepine, urine           Cutoff 200 ng/mL 1000 Methadone, urine                Cutoff 300 ng/mL 1100 1200 The urine drug screen  provides only a preliminary, unconfirmed 1300 analytical test result and should not be used for non-medical 1400 purposes. Clinical consideration and professional judgment should 1500 be applied to any positive drug screen result due to possible 1600 interfering substances. A more specific alternate chemical method 1700 must be used in order to obtain a confirmed analytical result.  1800 Gas chromato graphy / mass spectrometry (GC/MS) is the preferred 1900 confirmatory method.     Blood Alcohol level:  Lab Results  Component Value Date   [facility] <5 02/28/2016   ETH <5 23/30/0762    Metabolic Disorder Labs:  Lab Results  Component Value Date   HGBA1C 4.8 01/27/2016   Lab Results  Component Value Date   PROLACTIN 1.7* 01/27/2016   Lab Results  Component Value Date   CHOL 192 01/27/2016   TRIG 154* 01/27/2016   HDL 61 01/27/2016   CHOLHDL 3.1 01/27/2016   VLDL 31 01/27/2016   LDLCALC 100* 01/27/2016    Current Medications: Current Facility-Administered Medications  Medication Dose Route Frequency Provider Last Rate Last Dose  . acetaminophen (TYLENOL) tablet 650 mg  650 mg Oral Q6H PRN Gonzella Lex, MD      . alum & mag hydroxide-simeth (MAALOX/MYLANTA) 200-200-20 MG/5ML suspension 30 mL  30 mL Oral Q4H PRN Gonzella Lex, MD      . ARIPiprazole (ABILIFY) tablet 20 mg  20 mg Oral QHS Gonzella Lex, MD   20 mg at 02/29/16 2139  . ARIPiprazole SUSR 400 mg  400 mg Intramuscular Q28 days Clovis Fredrickson, MD      . clonazePAM (KLONOPIN) tablet 0.5 mg  0.5 mg Oral TID Gonzella Lex, MD   0.5 mg at 03/01/16 0848  . magnesium hydroxide (MILK OF MAGNESIA) suspension 30 mL  30 mL Oral Daily PRN Gonzella Lex, MD   30 mL at 02/29/16 2139  . nicotine (NICODERM CQ - dosed in mg/24 hours) patch 21 mg  21 mg Transdermal Daily Gonzella Lex, MD   21 mg at 03/01/16 0849  . pneumococcal 23 valent vaccine (PNU-IMMUNE) injection 0.5 mL  0.5 mL Intramuscular Tomorrow-1000 Gonzella Lex, MD      . topiramate (TOPAMAX) tablet 200 mg  200 mg Oral QHS Gonzella Lex, MD   200 mg at 02/29/16 2139  . traZODone (DESYREL) tablet 100 mg  100 mg Oral QHS Milee Qualls B Tadao Emig, MD       PTA Medications: Prescriptions prior to admission  Medication Sig Dispense Refill Last Dose  . ARIPiprazole (ABILIFY) 20 MG tablet Take 1 tablet (20 mg total) by mouth at bedtime. 30 tablet 0 Past Week at Unknown time  . ARIPiprazole 400 MG SUSR Inject 400 mg into the muscle every 28 (twenty-eight) days. 1 each 1 Past Week at Unknown time  . clonazePAM (KLONOPIN) 0.5 MG tablet Take 1 tablet (0.5 mg total) by mouth 3 (three) times daily before meals. 90 tablet 0 Past Week at Unknown time  . topiramate (TOPAMAX) 100 MG  tablet Take 2 tablets (200 mg total) by mouth at bedtime. 30 tablet 0 Past Week at Unknown time  . traZODone (DESYREL) 100 MG tablet Take 1 tablet (100 mg total) by mouth at bedtime. 30 tablet 0 Past Week at Unknown time    Musculoskeletal: Strength & Muscle Tone: within normal limits Gait & Station: normal Patient leans: N/A  Psychiatric Specialty Exam: Physical Exam  Nursing note and vitals reviewed. Constitutional: He is oriented to person, place, and time. He appears well-developed and well-nourished.  HENT:  Head: Normocephalic and atraumatic.  Eyes: Conjunctivae and EOM are normal. Pupils are equal, round, and reactive to light.  Neck: Normal range of motion. Neck supple.  Cardiovascular: Normal rate, regular rhythm and normal heart sounds.   Respiratory: Effort normal and breath sounds normal.  GI: Soft. Bowel sounds are normal.  Musculoskeletal: Normal range of motion.  Neurological: He is alert and oriented to person, place, and time.  Skin: Skin is warm and dry.    Review of Systems  Psychiatric/Behavioral: Positive for hallucinations.  All other systems reviewed and are negative.   Blood pressure 105/63, pulse 76, temperature 97.7 F (36.5 C), temperature  source Oral, resp. rate 16, height '6\' 2"'  (1.88 m), weight 88.905 kg (196 lb), SpO2 99 %.Body mass index is 25.15 kg/(m^2).  See SRA.                                                  Sleep:  Number of Hours: 7     Treatment Plan Summary: Daily contact with patient to assess and evaluate symptoms and progress in treatment and Medication management   Mr. Malter is a 30 year old male with a history of schizophrenia admitted for suicidal ideation and racing, disorganized thinking.   1. Suicidal ideation. The patient is able to contract for safety.   2. Mood/Psychosis. We restarted Abilify for psychosis. He will get Abilify Maintena injection today.    3. Headaches. We restarted Topamax for headache prevention and mood stabilization.  4. Anxiety. He did well on clonazepam in the past. We restarted low dose clonazepam for anxiety.   5. Metabolic syndrome screening. Lipid profile, TSH, and hemoglobin A1c are normal. Prolactin is low.   6. Smoking. Nicotine patch is available.  7. Insomnia. We restarted Trazodone.  8. Disposition. The patient will be discharged to home. He will follow up with Dr. Jamse Montes at [facility] and continue to participate in day program.    Observation Level/Precautions:  15 minute checks  Laboratory:  CBC Chemistry Profile UDS UA  Psychotherapy:    Medications:    Consultations:    Discharge Concerns:    Estimated LOS:  Other:     I certify that inpatient services furnished can reasonably be expected to improve the patient's condition.    Orson Slick, MD 4/19/20171:21 PM

## 2016-03-01 NOTE — [facility] Suicide Risk Assessment (Signed)
[facility] Admission Suicide Risk Assessment   Nursing information obtained from:  Patient, Review of record Demographic factors:  Male, Adolescent or young adult, Caucasian, Unemployed, Living alone Current Mental Status:  NA Loss Factors:  NA Historical Factors:  NA Risk Reduction Factors:  Positive social support, Positive coping skills or problem solving skills  Total Time spent with patient: 1 hour Principal Problem: <principal problem not specified> Diagnosis:   Patient Active Problem List   Diagnosis Date Noted  . Noncompliance [Z91.19] 02/29/2016  . Undifferentiated schizophrenia ([facility]) [F20.3] 01/27/2016  . Tobacco use disorder [F17.200] 01/27/2016  . Suicidal ideation [R45.851]    Subjective Data: Racing thoughts and hallucinations.  Continued Clinical Symptoms:  Alcohol Use Disorder Identification Test Final Score (AUDIT): 0 The "Alcohol Use Disorders Identification Test", Guidelines for Use in Primary Care, Second Edition.  World Science writer [facility]). Score between 0-7:  no or low risk or alcohol related problems. Score between 8-15:  moderate risk of alcohol related problems. Score between 16-19:  high risk of alcohol related problems. Score 20 or above:  warrants further diagnostic evaluation for alcohol dependence and treatment.   CLINICAL FACTORS:   Schizophrenia:   Less than 72 years old Paranoid or undifferentiated type   Musculoskeletal: Strength & Muscle Tone: within normal limits Gait & Station: normal Patient leans: N/A  Psychiatric Specialty Exam: Review of Systems  Psychiatric/Behavioral: Positive for hallucinations.  All other systems reviewed and are negative.   Blood pressure 105/63, pulse 76, temperature 97.7 F (36.5 C), temperature source Oral, resp. rate 16, height  (1.88 m), weight 88.905 kg (196 lb), SpO2 99 %.Body mass index is 25.15 kg/(m^2).  General Appearance: Disheveled  Eye Contact::  Minimal  Speech:  Clear and Coherent   Volume:  Decreased  Mood:  Depressed  Affect:  Blunt  Thought Process:  Disorganized  Orientation:  Full (Time, Place, and Person)  Thought Content:  Delusions, Hallucinations: Auditory and Paranoid Ideation  Suicidal Thoughts:  No  Homicidal Thoughts:  No  Memory:  Immediate;   Fair Recent;   Fair Remote;   Fair  Judgement:  Poor  Insight:  Lacking  Psychomotor Activity:  Decreased  Concentration:  Fair  Recall:  [employer] of Knowledge:Fair  Language: Fair  Akathisia:  No  Handed:  Right  AIMS (if indicated):     Assets:  Communication Skills Desire for Improvement Financial Resources/Insurance Housing Physical Health Resilience  Sleep:  Number of Hours: 7  Cognition: WNL  ADL's:  Intact    COGNITIVE FEATURES THAT CONTRIBUTE TO RISK:  None    SUICIDE RISK:   Moderate:  Frequent suicidal ideation with limited intensity, and duration, some specificity in terms of plans, no associated intent, good self-control, limited dysphoria/symptomatology, some risk factors present, and identifiable protective factors, including available and accessible social support.  PLAN OF CARE: Hospital admission, medication management, discharge planning.  Mr. Anne is a 30 year old male with a history of schizophrenia admitted for suicidal ideation and racing, disorganized thinking.   1. Suicidal ideation. The patient is able to contract for safety.   2. Mood/Psychosis. We restarted Abilify for psychosis. He will get Abilify Maintena injection today.    3. Headaches. We restarted Topamax for headache prevention and mood stabilization.  4. Anxiety. He did well on clonazepam in the past. We restarted low dose clonazepam for anxiety.   5. Metabolic syndrome screening. Lipid profile, TSH, and hemoglobin A1c are normal. Prolactin is low.   6. Smoking. Nicotine patch  is available.  7. Insomnia. We restarted Trazodone.  8. Disposition. The patient will be discharged to home. He will  [facility] and continue to participate in day program.    I certify that inpatient services furnished can reasonably be expected to improve the patient's condition.   Kristine LineaJolanta Leonela Kivi, MD 03/01/2016, 1:15 PM

## 2016-03-01 NOTE — [facility] Suicide Risk Assessment (Signed)
[facility] INPATIENT:  Family/Significant Other Suicide Prevention Education  Suicide Prevention Education:  Education Completed; Andria Rheineresa Kattner (mother) [phone] has been identified by the patient as the family member/significant other with whom the patient will be residing, and identified as the person(s) who will aid the patient in the event of a mental health crisis (suicidal ideations/suicide attempt).  With written consent from the patient, the family member/significant other has been provided the following suicide prevention education, prior to the and/or following the discharge of the patient.  The suicide prevention education provided includes the following:  Suicide risk factors  Suicide prevention and interventions  National Suicide Hotline telephone number  [facility] assessment telephone number  [facility] Emergency Assistance 911  [facility] and/or Residential Mobile Crisis Unit telephone number  Request made of family/significant other to:  Remove weapons (e.g., guns, rifles, knives), all items previously/currently identified as safety concern.    Remove drugs/medications (over-the-counter, prescriptions, illicit drugs), all items previously/currently identified as a safety concern.  The family member/significant other verbalizes understanding of the suicide prevention education information provided.  The family member/significant other agrees to remove the items of safety concern listed above.  Lulu RidingIngle, Lincon Sahlin T, MSW, LCSW 03/01/2016, 4:15 PM

## 2016-03-02 MED ORDER — CLONAZEPAM 0.5 MG PO TABS: 0.5000 mg | ORAL_TABLET | Freq: Three times a day (TID) | ORAL | Status: DC

## 2016-03-02 MED ORDER — ARIPIPRAZOLE 20 MG PO TABS: 20.0000 mg | ORAL_TABLET | Freq: Every day | ORAL | Status: DC

## 2016-03-02 MED ORDER — ARIPIPRAZOLE ER 400 MG IM SUSR: 400.0000 mg | INTRAMUSCULAR | Status: DC

## 2016-03-02 MED ORDER — TOPIRAMATE 100 MG PO TABS: 200.0000 mg | ORAL_TABLET | Freq: Every day | ORAL | Status: DC

## 2016-03-02 MED ORDER — TRAZODONE HCL 100 MG PO TABS: 100.0000 mg | ORAL_TABLET | Freq: Every day | ORAL | Status: DC

## 2016-03-02 NOTE — Plan of Care (Signed)
Problem: Alteration in thought process Goal: LTG-Patient behavior demonstrates decreased signs psychosis (Patient behavior demonstrates decreased signs of psychosis to the point the patient is safe to return home and continue treatment in an outpatient setting.)  Outcome: Progressing Decreased psychosis noted.      

## 2016-03-02 NOTE — Progress Notes (Signed)
Affect flat.  Delayed response time.  Denies SI/HI/AVH.  Denies any anxiety.  Verbalizes that his racing thoughts  Have decreased. Discharge instructions given, verbalized understanding.  Prescriptions given and personal belongings returned.  Escorted off unit by this Clinical research associatewriter to meet security to be transported to the bus stop to travel home.

## 2016-03-02 NOTE — [facility] Suicide Risk Assessment (Signed)
[facility]   Principal Problem: Undifferentiated schizophrenia [facility](HCC) Discharge Diagnoses:  Patient Active Problem List   Diagnosis Date Noted  . Noncompliance [Z91.19] 02/29/2016  . Undifferentiated schizophrenia (HCC) [F20.3] 01/27/2016  . Tobacco use disorder [F17.200] 01/27/2016    Total Time spent with patient: 30 minutes  Musculoskeletal: Strength & Muscle Tone: within normal limits Gait & Station: normal Patient leans: N/A  Psychiatric Specialty Exam: Review of Systems  All other systems reviewed and are negative.   Blood pressure 128/45, pulse 70, temperature 97.9 F (36.6 C), temperature source Oral, resp. rate 20, height 6\' 2"  (1.88 m), weight 88.905 kg (196 lb), SpO2 99 %.Body mass index is 25.15 kg/(m^2).  General Appearance: Casual  Eye Contact::  Good  Speech:  Clear and Coherent409  Volume:  Normal  Mood:  Euthymic  Affect:  Blunt  Thought Process:  Goal Directed  Orientation:  Full (Time, Place, and Person)  Thought Content:  Hallucinations: Auditory  Suicidal Thoughts:  No  Homicidal Thoughts:  No  Memory:  Immediate;   Fair Recent;   Fair Remote;   Fair  Judgement:  Fair  Insight:  Fair  Psychomotor Activity:  Normal  Concentration:  Fair  Recall:  [employer]Fair  Fund of Knowledge:Fair  Language: Fair  Akathisia:  No  Handed:  Right  AIMS (if indicated):     Assets:  Communication Skills Desire for Improvement Financial Resources/Insurance Housing Physical Health Resilience Social Support  Sleep:  Number of Hours: 7  Cognition: WNL  ADL's:  Intact   Mental Status Per Nursing Assessment::   On Admission:  NA  Demographic Factors:  Male, Adolescent or young adult, Caucasian and Living alone  Loss Factors: NA  Historical Factors: NA  Risk Reduction Factors:   Sense of responsibility to family and Positive social support  Continued Clinical Symptoms:  Schizophrenia:   Less than 28 years old Paranoid or  undifferentiated type  Cognitive Features That Contribute To Risk:  None    Suicide Risk:  Minimal: No identifiable suicidal ideation.  Patients presenting with no risk factors but with morbid ruminations; may be classified as minimal risk based on the severity of the depressive symptoms  Follow-up Information    Go to [facility].   Why:  For follow-up care appt   Contact information:   [address] [place], [place] [address] Ph [phone] Fax [phone]        Plan Of Care/Follow-up recommendations:  Activity:  As tolerated. Diet:  Regular. Other:  Keep follow-up appointments.  Kristine LineaJolanta Pucilowska, MD 03/02/2016, 10:05 AM

## 2016-03-02 NOTE — [facility] Group Notes (Signed)
[facility] LCSW Group Therapy  03/02/2016 3:50 PM  Type of Therapy:  Group Therapy  Participation Level:  Did Not Attend  Modes of Intervention:  Discussion, Education, Socialization and Support  Summary of Progress/Problems: Balance in life: Patients will discuss the concept of balance and how it looks and feels to be unbalanced. Pt will identify areas in their life that is unbalanced and ways to become more balanced.    Joniah Bednarski L Jaeleah Smyser MSW, LCSWA  03/02/2016, 3:50 PM  

## 2016-03-02 NOTE — Progress Notes (Signed)
D: Pt denies SI/HI/AVH. Pt is pleasant and cooperative, affect is flat and sad, he appears less anxious, pt had minimal interaction with peers and staff appropriately.  A: Pt was offered support and encouragement. Pt was given scheduled medications. Pt was encouraged to attend groups. Q 15 minute checks were done for safety.  R:Pt did not attend group. Patient is taking medication. Pt has no complaints.Pt receptive to treatment and safety maintained on unit.

## 2016-03-02 NOTE — Discharge Summary (Signed)
Physician Discharge Summary Note  Patient:  Edwin RidgelCameron Montes is an 30 y.o., male MRN:  [id number] DOB:  [birth year] Patient phone:  [phone] (home)  Patient address:   [address] [place] [address],  Total Time spent with patient: 30 minutes  Date of Admission:  02/29/2016 Date of Discharge: 03/02/2016  Reason for Admission:  Psychotic break.  Identifying data. Edwin Montes is a 30 year old male with history of schizophrenia.  Chief complaint. "I have racing thoughts again."  History of present illness. Information was obtained from the patient and the chart. The patient has a long history of schizophrenia diagnosed about 10 years ago he has been doing exceedingly well on Abilify and graduated from the group home into his own supervised apartment several months ago. He was hospitalized at [facility] the month ago for racing thoughts and suicidal. He was discharged on a combination of oral Abilify and Abilify Maintena. His next injection of maintaining was due on April, 17th. He reports that following discharge from the hospital he did not follow-up with his regular provider [facility] and has not been taking medications as prescribed. For 3 days now, the patient has been feeling increasingly anxious, with racing disorganized thoughts, insomnia and vague thoughts of hurting himself. He denies frank suicidal ideations intentions or plans but feels confused and uncertain. Additional stressor includs the fact that one of the former patient who was hospitalized here has been staying with him for the past couple of days. He no longer has a room mate. He also reports auditory hallucinations but no commands. He does not report paranoia. His anxiety has increased. He reports symptoms of generalized anxiety disorder, infrequent panic attacks and some symptoms suggestive of OCD with excessive worries, excessive cleaning and organizing. He denies PTSD symptoms. He denies symptoms of  depression but admits that when his anxiety gets high he also feels depressed. He denies alcohol or illicit drugs or prescription pill abuse.  Past psychiatric history. The patient is not a good historian and oftentimes gives me conflicting information. Apparently he was diagnosed at the age of 30 and was placed in a group home at that time. He has been a resident of multiple group homes up until recently when he "graduated" and was allowed to live in his own apartment. He reports being hospitalized 7 or 8 times in the past. He denies ever attempting suicide. He cannot explain why he was in the hospital. He believes that he was given diagnosis of bipolar, schizoaffective disorder, schizophrenia, and anxiety. He has been tried on multiple medications including Risperdal, Invega, Zyprexa, Latuda, and Saphris. He does not remember ever being on Seroquel or Geodon. He was tried on Depakote but not tolerated. He has never taken lithium or Tegretol. In the past he was treated with SSRIs for anxiety. He was given Klonopin in the past.  Family psychiatric history. Grandmother with depression.  Social history. [place] He graduated from high school. He denies any history of abuse. He has been a resident of multiple group homes. Apparently he recently moved into independent, supervised apartment [place]. He [facility] and Dr. Lacie ScottsNiemeyer as his primary doctor. [facility]  Principal Problem: Undifferentiated schizophrenia [facility](HCC) Discharge Diagnoses: Patient Active Problem List   Diagnosis Date Noted  . Noncompliance [Z91.19] 02/29/2016  . Undifferentiated schizophrenia (HCC) [F20.3] 01/27/2016  . Tobacco use disorder [F17.200]  01/27/2016    Past Psychiatric History: Schizophrenia.  Past Medical History:  Past Medical History  Diagnosis Date  . Schizophrenia (HCC)   . Anxiety     Past Surgical History   Procedure Laterality Date  . No past surgeries     Family History: History reviewed. No pertinent family history. Family Psychiatric  History: Depression. Social History:  History  Alcohol Use No     History  Drug Use No    Social History   Social History  . Marital Status: Single    Spouse Name: N/A  . Number of Children: N/A  . Years of Education: N/A   Social History Main Topics  . Smoking status: Current Some Day Smoker    Types: Cigarettes  . Smokeless tobacco: Never Used  . Alcohol Use: No  . Drug Use: No  . Sexual Activity: No   Other Topics Concern  . None   Social History Narrative    Hospital Course:    Edwin Montes is a 30 year old male with a history of schizophrenia admitted for suicidal ideation and racing, disorganized thinking.   1. Suicidal ideation. This has resolved. The patient is able to contract for safety.   2. Mood/Psychosis. We restarted oral Abilify for psychosis and gave 400 mg of Abilify Maintena on 03/01/2016.     3. Headaches. We restarted Topamax for headache prevention and mood stabilization.  4. Anxiety. We restarted low dose clonazepam for anxiety.   5. Metabolic syndrome screening. Lipid profile, TSH, and hemoglobin A1c are normal. Prolactin is low.   6. Smoking. Nicotine patch was available.  7. Insomnia. We restarted Trazodone.  8. Disposition. The patient was discharged to home. He will follow up with Dr. Georjean Mode at [facility] and continue to participate in day program.    Physical Findings: AIMS: Facial and Oral Movements Muscles of Facial Expression: None, normal Lips and Perioral Area: None, normal Jaw: None, normal Tongue: None, normal,Extremity Movements Upper (arms, wrists, hands, fingers): None, normal Lower (legs, knees, ankles, toes): None, normal, Trunk Movements Neck, shoulders, hips: None, normal, Overall Severity Severity of abnormal movements (highest score from questions above): None, normal Incapacitation due  to abnormal movements: None, normal Patient's awareness of abnormal movements (rate only patient's report): No Awareness, Dental Status Current problems with teeth and/or dentures?: No Does patient usually wear dentures?: No  CIWA:    COWS:     Musculoskeletal: Strength & Muscle Tone: within normal limits Gait & Station: normal Patient leans: N/A  Psychiatric Specialty Exam: Review of Systems  All other systems reviewed and are negative.   Blood pressure 128/45, pulse 70, temperature 97.9 F (36.6 C), temperature source Oral, resp. rate 20, height  (1.88 m), weight 88.905 kg (196 lb), SpO2 99 %.Body mass index is 25.15 kg/(m^2).  See SRA.                                                  Sleep:  Number of Hours: 7   Have you used any form of tobacco in the last 30 days? (Cigarettes, Smokeless Tobacco, Cigars, and/or Pipes): Yes  Has this patient used any form of tobacco in the last 30 days? (Cigarettes, Smokeless Tobacco, Cigars, and/or Pipes) Yes, Yes, A prescription for an FDA-approved tobacco cessation medication was offered at discharge and the patient refused  Blood Alcohol level:  Lab Results  Component Value Date   [facility] <5 02/28/2016   ETH <5 01/26/2016    Metabolic Disorder Labs:  Lab Results  Component Value Date   HGBA1C 4.8 01/27/2016   Lab Results  Component Value Date   PROLACTIN 1.7* 01/27/2016   Lab Results  Component Value Date   CHOL 192 01/27/2016   TRIG 154* 01/27/2016   HDL 61 01/27/2016   CHOLHDL 3.1 01/27/2016   VLDL 31 01/27/2016   LDLCALC 100* 01/27/2016    See Psychiatric Specialty Exam and Suicide Risk Assessment completed by Attending Physician prior to discharge.  Discharge destination:  Home  Is patient on multiple antipsychotic therapies at discharge:  No   Has Patient had three or more failed trials of antipsychotic monotherapy by history:  No  Recommended Plan for Multiple Antipsychotic  Therapies: NA  Discharge Instructions    Diet - low sodium heart healthy    Complete by:  As directed      Increase activity slowly    Complete by:  As directed             Medication List    TAKE these medications      Indication   ARIPiprazole 20 MG tablet  Commonly known as:  ABILIFY  Take 1 tablet (20 mg total) by mouth at bedtime.   Indication:  Schizophrenia     ARIPiprazole 400 MG Susr  Inject 400 mg into the muscle every 28 (twenty-eight) days.   Indication:  Schizophrenia     clonazePAM 0.5 MG tablet  Commonly known as:  KLONOPIN  Take 1 tablet (0.5 mg total) by mouth 3 (three) times daily before meals.   Indication:  Panic Disorder     topiramate 100 MG tablet  Commonly known as:  TOPAMAX  Take 2 tablets (200 mg total) by mouth at bedtime.   Indication:  Migraine Headache     traZODone 100 MG tablet  Commonly known as:  DESYREL  Take 1 tablet (100 mg total) by mouth at bedtime.   Indication:  Trouble Sleeping           Follow-up Information    Go to [facility].   Why:  For follow-up care appt   Contact information:   [address] [place], [place] [address] Ph [phone] Fax [phone]        Follow-up recommendations:  Activity:  As tolerated. Diet:  Regular. Other:  Keep follow-up appointments.  Comments:    Signed: Kristine Linea, MD 03/02/2016, 10:07 AM

## 2016-03-02 NOTE — Progress Notes (Signed)
Recreation Therapy Notes  Date: 04.20.17 Time: 9:30 am Location: Craft Room  Group Topic: Leisure Education  Goal Area(s) Addresses:  Patient will identify things they are grateful for. Patient will identify why it is important to remember the things they are grateful for.  Behavioral Response: Did not attend  Intervention: Grateful Wheel  Activity: Patients were given an I Am Grateful For worksheet and instructed to write 1-2 things they are grateful for under each category.  Education: LRT educated patients on why it is important to be grateful.  Education Outcome: Patient did not attend group.   Clinical Observations/Feedback: Patient did not attend group.  Jacquelynn CreeGreene,Ashish Rossetti M, LRT/CTRS 03/02/2016 10:15 AM

## 2016-03-03 NOTE — Tx Team (Signed)
Interdisciplinary Treatment Plan Update (Adult)  Date:  03/03/2016 Time Reviewed:  9:08 AM  Progress in Treatment: Attending groups: Yes. Participating in groups:  Yes. Taking medication as prescribed:  Yes. Tolerating medication:  Yes. Family/Significant othe contact made:  Yes, individual(s) contacted:  patient's mother Patient understands diagnosis:  Yes. Discussing patient identified problems/goals with staff:  Yes. Medical problems stabilized or resolved:  Yes. Denies suicidal/homicidal ideation: Yes. Issues/concerns per patient self-inventory:  Yes. Other:  New problem(s) identified: No, Describe:  none reported  Discharge Plan or Barriers: Patient will stabilize on medication and discharge home to his apartment and follow up with his outpatient mental health provider in [facility].  Reason for Continuation of Hospitalization: Depression Medication stabilization Suicidal ideation  Comments:  Estimated length of stay: 0 days, will discharge today Thursday 03/02/16  New goal(s):  Review of initial/current patient goals per problem list:   1.  Goal(s): Participate in aftercare plan    Met:  Yes  Target date: by discharge  As evidenced by: patient will participate in aftercare plan AEB aftercare provider and housing plan identified at discharge 03/02/16: Patient has follow up and will discharge home to his apartment. Goal met.  2.  Goal (s): Decrease depression   Met:  Yes  Target date: by discharge  As evidenced by: patient demonstrates decreased symptoms of depression and reports a Depression rating of 3 or less 03/02/16: Patient is stable for discharge per MD.  3.  Goal(s): Decrease psychosis    Met:  Yes  Target date: by discharge  As evidenced by: patient demonstrates decreased symptoms of psychosis 03/02/16: Patient reported racing thoughts at admission but is stable for discharge per MD at this time.   Attendees: Patient:  Edwin Montes  4/21/20179:08 AM  Physician:  Orson Slick, MD 4/21/20179:08 AM  Nursing:   Elige Radon, RN 4/21/20179:08 AM  Other:  Carmell Austria, LCSW 4/21/20179:08 AM  Other:  Bonnye Fava, [place] 4/21/20179:08 AM  Other:  Garald Braver, Psych D 4/21/20179:08 AM  Other: Everitt Amber, LRT 4/21/20179:08 AM  Other:  4/21/20179:08 AM  Other:  4/21/20179:08 AM  Other:  4/21/20179:08 AM  Other:  4/21/20179:08 AM  Other:  4/21/20179:08 AM  Other:   4/21/20179:08 AM   Scribe for Treatment Team:   Carmell Austria T, 03/03/2016, 9:08 AM

## 2016-03-03 NOTE — Progress Notes (Signed)
  [facility] :  Will you be returning to the same living situation after discharge:  Yes,  home to his apartment At discharge, do you have transportation home?: No. [employer] bus ticket provided home Do you have the ability to pay for your medications: Yes,  patient has Medicaid  Release of information consent forms completed and in the chart;  Patient's signature needed at discharge.  Patient to Follow up at: Follow-up Information    Follow up with [facility]. Go on 03/03/2016.   Why:  For follow-up care appt Friday 03/03/16 at 8:00am for walk in appt (walk in appts MWF 8-3)   Contact information:   [address] [place], [place] [address] Ph [phone] Fax [phone]        Next level of care provider [facility] Link:no  Safety Planning and Suicide Prevention discussed: Yes,  SPE discussed with patient and Andria Rheineresa Coppernoll (mother) [phone]   Have you used any form of tobacco in the last 30 days? (Cigarettes, Smokeless Tobacco, Cigars, and/or Pipes): Yes  Has patient been referred to the Quitline?: Patient refused referral  Patient has been referred for addiction treatment: N/A  Lulu RidingIngle, Heela Heishman T, MSW, LCSW 03/03/2016, 9:10 AM [phone]

## 2016-04-26 ENCOUNTER — Encounter: Payer: Self-pay | Admitting: Emergency Medicine

## 2016-04-26 ENCOUNTER — Emergency Department
Admission: EM | Admit: 2016-04-26 | Discharge: 2016-04-27 | Disposition: A | Discharge status: Home / Self Care | Payer: Medicare Other | Attending: Emergency Medicine | Admitting: Emergency Medicine

## 2016-04-26 DIAGNOSIS — F17210 Nicotine dependence, cigarettes, uncomplicated: Secondary | ICD-10-CM | POA: Insufficient documentation

## 2016-04-26 DIAGNOSIS — Z79899 Other long term (current) drug therapy: Secondary | ICD-10-CM | POA: Diagnosis not present

## 2016-04-26 DIAGNOSIS — F203 Undifferentiated schizophrenia: Secondary | ICD-10-CM | POA: Diagnosis not present

## 2016-04-26 DIAGNOSIS — R45851 Suicidal ideations: Secondary | ICD-10-CM | POA: Diagnosis not present

## 2016-04-26 NOTE — ED Provider Notes (Signed)
[facility] Emergency Department Provider Note  ____________________________________________  Time seen: 11:30 PM  I have reviewed the triage vital signs and the nursing notes.   HISTORY  Chief Complaint Suicidal    HPI Garrel RidgelCameron Reitan is a 30 y.o. male with history of schizophrenia presents with suicidal ideation times a few hours tonight which patient states that since resolved. Patient states that his suicidal ideation stopped after presenting to the emergency department. Of note patient states that he take his dose of Abilify shortly before EMS arrival. Patient denies any previous suicide attempts. Last psychiatric hospitalization 2 months ago.     Past Medical History  Diagnosis Date  . Schizophrenia (HCC)   . Anxiety     Patient Active Problem List   Diagnosis Date Noted  . Noncompliance 02/29/2016  . 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 Tobacco use disorder 01/27/2016    Past Surgical History  Procedure Laterality Date  . No past surgeries      Current Outpatient Rx  Name  Route  Sig  Dispense  Refill  . ARIPiprazole (ABILIFY) 20 MG tablet   Oral   Take 1 tablet (20 mg total) by mouth at bedtime.   30 tablet   0   . ARIPiprazole 400 MG SUSR   Intramuscular   Inject 400 mg into the muscle every 28 (twenty-eight) days.   1 each   1   . clonazePAM (KLONOPIN) 0.5 MG tablet   Oral   Take 1 tablet (0.5 mg total) by mouth 3 (three) times daily before meals.   90 tablet   0   . topiramate (TOPAMAX) 100 MG tablet   Oral   Take 2 tablets (200 mg total) by mouth at bedtime.   60 tablet   0   . traZODone (DESYREL) 100 MG tablet   Oral   Take 1 tablet (100 mg total) by mouth at bedtime.   30 tablet   0     Allergies No known drug allergies History reviewed. No pertinent family history.  Social History Social History  Substance Use Topics  . Smoking status: Current Some Day Smoker    Types: Cigarettes  .  Smokeless tobacco: Never Used  . Alcohol Use: No    Review of Systems  Constitutional: Negative for fever. Eyes: Negative for visual changes. ENT: Negative for sore throat. Cardiovascular: Negative for chest pain. Respiratory: Negative for shortness of breath. Gastrointestinal: Negative for abdominal pain, vomiting and diarrhea. Genitourinary: Negative for dysuria. Musculoskeletal: Negative for back pain. Skin: Negative for rash. Neurological: Negative for headaches, focal weakness or numbness. Psychiatric:Positive for suicidal ideation 10-point ROS otherwise negative.  ____________________________________________   PHYSICAL EXAM:  VITAL SIGNS: ED Triage Vitals  Enc Vitals Group     BP 04/26/16 2209 139/77 mmHg     Pulse Rate 04/26/16 2209 83     Resp 04/26/16 2209 15     Temp 04/26/16 2209 97.6 F (36.4 C)     Temp Source 04/26/16 2209 Oral     SpO2 04/26/16 2209 100 %     Weight 04/26/16 2209 190 lb (86.183 kg)     Height 04/26/16 2209 6' (1.829 m)     Head Cir --      Peak Flow --      Pain Score --      Pain Loc --      Pain Edu? --      Excl. in GC? --      Constitutional:  Alert and oriented. Well appearing and in no distress. Eyes: Conjunctivae are normal. PERRL. Normal extraocular movements. ENT   Head: Normocephalic and atraumatic.   Nose: No congestion/rhinnorhea.   Mouth/Throat: Mucous membranes are moist.   Neck: No stridor. Hematological/Lymphatic/Immunilogical: No cervical lymphadenopathy. Cardiovascular: Normal rate, regular rhythm. Normal and symmetric distal pulses are present in all extremities. No murmurs, rubs, or gallops. Respiratory: Normal respiratory effort without tachypnea nor retractions. Breath sounds are clear and equal bilaterally. No wheezes/rales/rhonchi. Gastrointestinal: Soft and nontender. No distention. There is no CVA tenderness. Genitourinary: deferred Musculoskeletal: Nontender with normal range of motion in  all extremities. No joint effusions.  No lower extremity tenderness nor edema. Neurologic:  Normal speech and language. No gross focal neurologic deficits are appreciated. Speech is normal.  Skin:  Skin is warm, dry and intact. No rash noted. Psychiatric: Mood and affect are normal. Speech and behavior are normal. Patient exhibits appropriate insight and judgment.  ____________________________________________    LABS (pertinent positives/negatives) Labs Reviewed  ACETAMINOPHEN LEVEL - Abnormal; Notable for the following:    Acetaminophen (Tylenol), Serum <10 (*)    All other components within normal limits  COMPREHENSIVE METABOLIC PANEL  ETHANOL  SALICYLATE LEVEL  CBC  URINE DRUG SCREEN, QUALITATIVE ([facility] ONLY)      INITIAL IMPRESSION / ASSESSMENT AND PLAN / ED COURSE  Pertinent labs & imaging results that were available during my care of the patient were reviewed by me and considered in my medical decision making (see chart for details).  Await psychiatry consultation  ____________________________________________   FINAL CLINICAL IMPRESSION(S) / ED DIAGNOSES  Final diagnoses:  Suicidal ideation      Darci Current, MD 04/27/16 [phone]

## 2016-04-26 NOTE — ED Notes (Signed)
Pt changed out into paper scrubs, personal belongings put into bag and [facility].

## 2016-04-26 NOTE — ED Notes (Signed)
Pt arrived by EMS from home with SI. EMS reports pt takes Abilify and has not missed a dose but was having SI today and wanted to be brought in for evaluation.

## 2016-04-27 DIAGNOSIS — F203 Undifferentiated schizophrenia: Secondary | ICD-10-CM

## 2016-04-27 DIAGNOSIS — R45851 Suicidal ideations: Secondary | ICD-10-CM | POA: Diagnosis not present

## 2016-04-27 LAB — COMPREHENSIVE METABOLIC PANEL
ALBUMIN: 4.2 g/dL (ref 3.5–5.0)
ALT: 17 U/L (ref 17–63)
AST: 23 U/L (ref 15–41)
Alkaline Phosphatase: 65 U/L (ref 38–126)
Anion gap: 9 (ref 5–15)
BILIRUBIN TOTAL: 0.7 mg/dL (ref 0.3–1.2)
BUN: 17 mg/dL (ref 6–20)
CHLORIDE: 103 mmol/L (ref 101–111)
CO2: 26 mmol/L (ref 22–32)
Calcium: 9.2 mg/dL (ref 8.9–10.3)
Creatinine, Ser: 0.94 mg/dL (ref 0.61–1.24)
GFR calc Af Amer: >60 mL/min (ref >60)
GFR calc non Af Amer: >60 mL/min (ref >60)
GLUCOSE: 85 mg/dL (ref 65–99)
POTASSIUM: 3.5 mmol/L (ref 3.5–5.1)
Sodium: 138 mmol/L (ref 135–145)
TOTAL PROTEIN: 6.8 g/dL (ref 6.5–8.1)

## 2016-04-27 LAB — URINE DRUG SCREEN, QUALITATIVE (ARMC ONLY)
AMPHETAMINES, UR SCREEN: NOT DETECTED
BENZODIAZEPINE, UR SCRN: NOT DETECTED
Barbiturates, Ur Screen: NOT DETECTED
Cannabinoid 50 Ng, Ur ~~LOC~~: NOT DETECTED
Cocaine Metabolite,Ur ~~LOC~~: NOT DETECTED
MDMA (Ecstasy)Ur Screen: NOT DETECTED
METHADONE SCREEN, URINE: NOT DETECTED
OPIATE, UR SCREEN: NOT DETECTED
Phencyclidine (PCP) Ur S: NOT DETECTED
TRICYCLIC, UR SCREEN: NOT DETECTED

## 2016-04-27 LAB — CBC
HCT: 41.3 % (ref 40.0–52.0)
Hemoglobin: 14.5 g/dL (ref 13.0–18.0)
MCH: 31.1 pg (ref 26.0–34.0)
MCHC: 35.2 g/dL (ref 32.0–36.0)
MCV: 88.1 fL (ref 80.0–100.0)
PLATELETS: 247 10*3/uL (ref 150–440)
RBC: 4.68 MIL/uL (ref 4.40–5.90)
RDW: 13.2 % (ref 11.5–14.5)
WBC: 9.3 10*3/uL (ref 3.8–10.6)

## 2016-04-27 LAB — ETHANOL: Alcohol, Ethyl (B): <5 mg/dL (ref <5)

## 2016-04-27 LAB — SALICYLATE LEVEL: Salicylate Lvl: <4 mg/dL (ref 2.8–30.0)

## 2016-04-27 LAB — ACETAMINOPHEN LEVEL: Acetaminophen (Tylenol), Serum: <10 ug/mL — ABNORMAL LOW (ref 10–30)

## 2016-04-27 NOTE — ED Notes (Signed)
Patient asleep in room. No noted distress or abnormal behavior. Will continue 15 minute checks and observation by security cameras for safety. 

## 2016-04-27 NOTE — ED Notes (Signed)
BEHAVIORAL HEALTH ROUNDING Patient sleeping: Yes.   Patient alert and oriented: not applicable SLEEPING Behavior appropriate: Yes.  ; If no, describe: SLEEPING Nutrition and fluids offered: No SLEEPING Toileting and hygiene offered: NoSLEEPING Sitter present: not applicable, Q 15 min safety rounds and observation via security camera. Law enforcement present: Yes ODS 

## 2016-04-27 NOTE — BH Assessment (Signed)
Assessment Note  Edwin RidgelCameron Montes is an 30 y.o. male presenting to the ED via EMS with suicidal ideations without intent or plan.  Pt reports he is complaint with taking his medications but sates he started having SI thoughts and wanted to come to the ED for evaluation.  Pt is now stating that he feels fine and is no longer having suicidal thoughts.  Pt is currently on disability for schizophrenia.  He resides alone and reports being isolated from everyone.  He [facility].  He denies any drug/alcohol use.  Diagnosis: Schizophrenia  Past Medical History:  Past Medical History  Diagnosis Date  . Schizophrenia (HCC)   . Anxiety     Past Surgical History  Procedure Laterality Date  . No past surgeries      Family History: History reviewed. No pertinent family history.  Social History:  reports that he has been smoking Cigarettes.  He has never used smokeless tobacco. He reports that he does not drink alcohol or use illicit drugs.  Additional Social History:  Alcohol / Drug Use History of alcohol / drug use?: No history of alcohol / drug abuse  CIWA: CIWA-Ar BP: 139/77 mmHg Pulse Rate: 83 COWS:    Allergies: No Known Allergies  Home Medications:  (Not in a hospital admission)  OB/GYN Status:  No LMP for male patient.  General Assessment Data Location of Assessment: [facility] ED TTS Assessment: In system Is this a Tele or Face-to-Face Assessment?: Face-to-Face Is this an Initial Assessment or a Re-assessment for this encounter?: Initial Assessment Marital status: Single Maiden name: n/a Is patient pregnant?: No Pregnancy Status: No Living Arrangements: Alone Can pt return to current living arrangement?: Yes Admission Status: Voluntary Is patient capable of signing voluntary admission?: Yes Referral Source: Self/Family/Friend Insurance type: Medicare  Medical Screening Exam [facility] Walk-in ONLY) Medical Exam completed: Yes  Crisis Care Plan Living  Arrangements: Alone Legal Guardian: Other: (self) Name of Psychiatrist: [facility] Name of Therapist: denied having a therapist  Education Status Is patient currently in school?: No Current Grade: n/a Highest grade of school patient has completed: some college Name of [facility] [employer] Contact person: n/a  Risk to self with the past 6 months Suicidal Ideation: Yes-Currently Present Has patient been a risk to self within the past 6 months prior to admission? : Yes Suicidal Intent: No Has patient had any suicidal intent within the past 6 months prior to admission? : No Is patient at risk for suicide?: No Suicidal Plan?: No Has patient had any suicidal plan within the past 6 months prior to admission? : No Access to Means: No What has been your use of drugs/alcohol within the last 12 months?: Denied use Previous Attempts/Gestures: No How many times?: 0 Other Self Harm Risks: None identified Triggers for Past Attempts: None known Intentional Self Injurious Behavior: None Family Suicide History: No Recent stressful life event(s): Other (Comment) Persecutory voices/beliefs?: No Depression: No Substance abuse history and/or treatment for substance abuse?: No Suicide prevention information given to non-admitted patients: Not applicable  Risk to Others within the past 6 months Homicidal Ideation: No Does patient have any lifetime risk of violence toward others beyond the six months prior to admission? : No Thoughts of Harm to Others: No Current Homicidal Intent: No Current Homicidal Plan: No Access to Homicidal Means: No Identified Victim: None identified History of harm to others?: No Assessment of Violence: None Noted Violent Behavior Description: denied Does patient have access to  weapons?: No Criminal Charges Pending?: No Does patient have a court date: No Is patient on probation?: No  Psychosis Hallucinations: None noted Delusions: None noted  Mental  Status Report Appearance/Hygiene: In scrubs Eye Contact: Poor Motor Activity: Freedom of movement Speech: Soft, Pressured Level of Consciousness: Quiet/awake Mood: Depressed Affect: Flat Anxiety Level: None Thought Processes: Coherent Judgement: Unimpaired Orientation: Person, Place, Time, Situation Obsessive Compulsive Thoughts/Behaviors: None  Cognitive Functioning Concentration: Normal Memory: Recent Intact, Remote Intact IQ: Average Insight: Fair Impulse Control: Fair Appetite: Good Weight Loss: 0 Weight Gain: 0 Sleep: No Change Vegetative Symptoms: None  ADLScreening [facility]) Patient's cognitive ability adequate to safely complete daily activities?: Yes Patient able to express need for assistance with ADLs?: Yes Independently performs ADLs?: Yes (appropriate for developmental age)  Prior Inpatient Therapy Prior Inpatient Therapy: Yes Prior Therapy Dates: 2017, 2014, 2012 , 2009 Prior Therapy Facilty/Provider(s[facility] [place], [facility], [facility] Reason for Treatment: Schizophrenia, depression  Prior Outpatient Therapy Prior Outpatient Therapy: Yes Prior Therapy Dates: current Prior Therapy Facilty/Provider(s): [facility] Reason for Treatment: Depression, Schizophrenia Does patient have an ACCT team?: No Does patient have Intensive In-House Services?  : No Does patient have [facility] services? : No Does patient have P4CC services?: No  ADL Screening (condition at time of admission) Patient's cognitive ability adequate to safely complete daily activities?: Yes Patient able to express need for assistance with ADLs?: Yes Independently performs ADLs?: Yes (appropriate for developmental age)       Abuse/Neglect Assessment (Assessment to be complete while patient is alone) Physical Abuse: Denies Verbal Abuse: Denies Sexual Abuse: Denies Exploitation of patient/patient's resources: Denies Self-Neglect: Denies Values /  Beliefs Cultural Requests During Hospitalization: None Spiritual Requests During Hospitalization: None Consults Spiritual Care Consult Needed: No Social Work Consult Needed: No Merchant navy officer (For Healthcare) Does patient have an advance directive?: No Would patient like information on creating an advanced directive?: No - patient declined information    Additional Information 1:1 In Past 12 Months?: No CIRT Risk: No Elopement Risk: No Does patient have medical clearance?: Yes     Disposition:  Disposition Initial Assessment Completed for this Encounter: Yes Disposition of Patient: Other dispositions Other disposition(s): Other (Comment) (Pending Psych MD consult)  On Site Evaluation by:   Reviewed with Physician:    [place] 04/27/2016 3:25 AM

## 2016-04-27 NOTE — Consult Note (Signed)
[place] Psychiatry Consult   Reason for Consult:  Consult for this 30 year old man with a history of schizophrenia who had himself brought in by EMS because of "racing thoughts" Referring Physician:  Edd Fabian Patient Identification: Edwin Montes MRN:  [id number] Principal Diagnosis: Undifferentiated schizophrenia [facility]) Diagnosis:   Patient Active Problem List   Diagnosis Date Noted  . Noncompliance [Z91.19] 02/29/2016  . Undifferentiated schizophrenia ([place]) [F20.3] 01/27/2016  . Tobacco use disorder [F17.200] 01/27/2016    Total Time spent with patient: 45 minutes  Subjective:   Edwin Montes is a 30 y.o. male patient admitted with "I was having racing thoughts".  HPI:  Patient interviewed. Chart reviewed. Labs and vitals reviewed. 30 year old man with a history of schizophrenia. He came in after calling EMS himself yesterday. He has consistently said that he was having "racing thoughts". I'm having a hard time getting him to explain what he means by that. He doesn't seem to be able to understand me when I ask what the content of the thoughts were. Sounds like he was just feeling anxious in some way. Initially he said they have just started yesterday later he indicated that they had perhaps been going on for some time. He claims he is been taking his medication regularly as prescribed. He claims that he got his last long-acting injection about a week ago. He denies that he is using alcohol or drugs. He denies that he's having any auditory or visual hallucinations. Denies that he's having any change in his sleep problems or any medical problems. He says that he has been going to the day program that he attends regularly. Denies any particular psychosocial stresses. He says that this morning he is feeling better. Denies any thought about harming himself. Given the opportunity to consider whether hospitalization would be helpful he says that he does not think he needs that right  now.  Medical history: Outside of schizophrenia and no known significant ongoing medical problems.  Social history: Patient lives in an independent apartment. Has regular follow-up at [facility]. Also goes to a day program regularly. He seems to have a close relationship with his mother and sees her about every week. That seems to be his closest family contact.  Substance abuse history: Patient denies that he's been drinking or abusing any drugs. There is no notice in the previous notes about him having substance abuse problems  Past Psychiatric History: Diagnosis of schizophrenia. He was just here in the psychiatric ward of our hospital in April. At that time he was on his Abilify long-acting injection as well as oral medicine. Patient claims that he has followed up with outpatient treatment. He does not have any history of suicide attempts. He has reported some suicidal thoughts in the past. He's had several prior hospital treatment admissions  Risk to Self: Suicidal Ideation: Yes-Currently Present Suicidal Intent: No Is patient at risk for suicide?: No Suicidal Plan?: No Access to Means: No What has been your use of drugs/alcohol within the last 12 months?: Denied use How many times?: 0 Other Self Harm Risks: None identified Triggers for Past Attempts: None known Intentional Self Injurious Behavior: None Risk to Others: Homicidal Ideation: No Thoughts of Harm to Others: No Current Homicidal Intent: No Current Homicidal Plan: No Access to Homicidal Means: No Identified Victim: None identified History of harm to others?: No Assessment of Violence: None Noted Violent Behavior Description: denied Does patient have access to weapons?: No Criminal Charges Pending?: No Does patient have a court date: No  Prior Inpatient Therapy: Prior Inpatient Therapy: Yes Prior Therapy Dates: 2017, 2014, 2012 , 2009 Prior [facility] Facilty[facility]Provider(s): [facility], [place], [facility],  [facility] Reason for Treatment: Schizophrenia, depression Prior Outpatient Therapy: Prior Outpatient Therapy: Yes Prior Therapy Dates: current Prior Therapy Facilty/Provider(s): [facility] Reason for Treatment: Depression, Schizophrenia Does patient have an ACCT team?: No Does patient have Intensive In-House Services?  : No Does patient have [facility] services? : No Does patient have P4CC services?: No  Past Medical History:  Past Medical History  Diagnosis Date  . Schizophrenia ([place])   . Anxiety     Past Surgical History  Procedure Laterality Date  . No past surgeries     Family History: History reviewed. No pertinent family history. Family Psychiatric  History: He is not aware of any family history of mental illness or substance abuse problems or suicide Social History:  History  Alcohol Use No     History  Drug Use No    Social History   Social History  . Marital Status: Single    Spouse Name: N/A  . Number of Children: N/A  . Years of Education: N/A   Social History Main Topics  . Smoking status: Current Some Day Smoker    Types: Cigarettes  . Smokeless tobacco: Never Used  . Alcohol Use: No  . Drug Use: No  . Sexual Activity: No   Other Topics Concern  . None   Social History Narrative   Additional Social History:    Allergies:  No Known Allergies  Labs:  Results for orders placed or performed during the hospital encounter of 04/26/16 (from the past 48 hour(s))  Comprehensive metabolic panel     Status: None   Collection Time: 04/26/16 10:15 PM  Result Value Ref Range   Sodium 138 135 - 145 mmol/L   Potassium 3.5 3.5 - 5.1 mmol/L   Chloride 103 101 - 111 mmol/L   CO2 26 22 - 32 mmol/L   Glucose, Bld 85 65 - 99 mg/dL   BUN 17 6 - 20 mg/dL   Creatinine, Ser 0.94 0.61 - 1.24 mg/dL   Calcium 9.2 8.9 - 10.3 mg/dL   Total Protein 6.8 6.5 - 8.1 g/dL   Albumin 4.2 3.5 - 5.0 g/dL   AST 23 15 - 41 U/L   ALT 17 17 - 63 U/L   Alkaline Phosphatase 65 38 - 126 U/L    Total Bilirubin 0.7 0.3 - 1.2 mg/dL   GFR calc non Af Amer >60 >60 mL/min   GFR calc Af Amer >60 >60 mL/min    Comment: (NOTE) The eGFR has been calculated using the CKD EPI equation. This calculation has not been validated in all clinical situations. eGFR's persistently <60 mL/min signify possible Chronic Kidney Disease.    Anion gap 9 5 - 15  Ethanol     Status: None   Collection Time: 04/26/16 10:15 PM  Result Value Ref Range   Alcohol, Ethyl (B) <5 <5 mg/dL    Comment:        LOWEST DETECTABLE LIMIT FOR SERUM ALCOHOL IS 5 mg/dL FOR MEDICAL PURPOSES ONLY   Salicylate level     Status: None   Collection Time: 04/26/16 10:15 PM  Result Value Ref Range   Salicylate Lvl <3.8 2.8 - 30.0 mg/dL  Acetaminophen level     Status: Abnormal   Collection Time: 04/26/16 10:15 PM  Result Value Ref Range   Acetaminophen (Tylenol), Serum <10 (L) 10 - 30 ug/mL  Comment:        THERAPEUTIC CONCENTRATIONS VARY SIGNIFICANTLY. A RANGE OF 10-30 ug/mL MAY BE AN EFFECTIVE CONCENTRATION FOR MANY PATIENTS. HOWEVER, SOME ARE BEST TREATED AT CONCENTRATIONS OUTSIDE THIS RANGE. ACETAMINOPHEN CONCENTRATIONS >150 ug/mL AT 4 HOURS AFTER INGESTION AND >50 ug/mL AT 12 HOURS AFTER INGESTION ARE OFTEN ASSOCIATED WITH TOXIC REACTIONS.   cbc     Status: None   Collection Time: 04/26/16 10:15 PM  Result Value Ref Range   WBC 9.3 3.8 - 10.6 K/uL   RBC 4.68 4.40 - 5.90 MIL/uL   Hemoglobin 14.5 13.0 - 18.0 g/dL   HCT 41.3 40.0 - 52.0 %   MCV 88.1 80.0 - 100.0 fL   MCH 31.1 26.0 - 34.0 pg   MCHC 35.2 32.0 - 36.0 g/dL   RDW 13.2 11.5 - 14.5 %   Platelets 247 150 - 440 K/uL  Urine Drug Screen, Qualitative     Status: None   Collection Time: 04/26/16 11:52 PM  Result Value Ref Range   Tricyclic, Ur Screen NONE DETECTED NONE DETECTED   Amphetamines, Ur Screen NONE DETECTED NONE DETECTED   MDMA (Ecstasy)Ur Screen NONE DETECTED NONE DETECTED   Cocaine Metabolite,Ur [place] NONE DETECTED NONE DETECTED    Opiate, Ur Screen NONE DETECTED NONE DETECTED   Phencyclidine (PCP) Ur S NONE DETECTED NONE DETECTED   Cannabinoid 50 Ng, Ur [place] NONE DETECTED NONE DETECTED   Barbiturates, Ur Screen NONE DETECTED NONE DETECTED   Benzodiazepine, Ur Scrn NONE DETECTED NONE DETECTED   Methadone Scn, Ur NONE DETECTED NONE DETECTED    Comment: (NOTE) 161  Tricyclics, urine               Cutoff 1000 ng/mL 200  Amphetamines, urine             Cutoff 1000 ng/mL 300  MDMA (Ecstasy), urine           Cutoff 500 ng/mL 400  Cocaine Metabolite, urine       Cutoff 300 ng/mL 500  Opiate, urine                   Cutoff 300 ng/mL 600  Phencyclidine (PCP), urine      Cutoff 25 ng/mL 700  Cannabinoid, urine              Cutoff 50 ng/mL 800  Barbiturates, urine             Cutoff 200 ng/mL 900  Benzodiazepine, urine           Cutoff 200 ng/mL 1000 Methadone, urine                Cutoff 300 ng/mL 1100 1200 The urine drug screen provides only a preliminary, unconfirmed 1300 analytical test result and should not be used for non-medical 1400 purposes. Clinical consideration and professional judgment should 1500 be applied to any positive drug screen result due to possible 1600 interfering substances. A more specific alternate chemical method 1700 must be used in order to obtain a confirmed analytical result.  1800 Gas chromato graphy / mass spectrometry (GC/MS) is the preferred 1900 confirmatory method.     No current facility-administered medications for this encounter.   Current Outpatient Prescriptions  Medication Sig Dispense Refill  . ARIPiprazole (ABILIFY) 5 MG tablet Take 5 mg by mouth daily.    . ARIPiprazole 400 MG SUSR Inject 400 mg into the muscle every 28 (twenty-eight) days. 1 each 1    Musculoskeletal: Strength & Muscle  Tone: within normal limits Gait & Station: normal Patient leans: N/A  Psychiatric Specialty Exam: Physical Exam  Nursing note and vitals reviewed. Constitutional: He appears  well-developed and well-nourished.  HENT:  Head: Normocephalic and atraumatic.  Eyes: Conjunctivae are normal. Pupils are equal, round, and reactive to light.  Neck: Normal range of motion.  Cardiovascular: Regular rhythm and normal heart sounds.   Respiratory: Effort normal. No respiratory distress.  GI: Soft.  Musculoskeletal: Normal range of motion.  Neurological: He is alert.  Skin: Skin is warm and dry.  Psychiatric: His affect is blunt. His speech is delayed. He is slowed. Cognition and memory are normal. He expresses impulsivity. He expresses no suicidal ideation.    Review of Systems  Constitutional: Negative.   HENT: Negative.   Eyes: Negative.   Respiratory: Negative.   Cardiovascular: Negative.   Gastrointestinal: Negative.   Musculoskeletal: Negative.   Skin: Negative.   Neurological: Negative.   Psychiatric/Behavioral: Negative for depression, suicidal ideas, hallucinations, memory loss and substance abuse. The patient has insomnia. The patient is not nervous/anxious.     Blood pressure 139/77, pulse 83, temperature 97.6 F (36.4 C), temperature source Oral, resp. rate 15, height 6' (1.829 m), weight 86.183 kg (190 lb), SpO2 100 %.Body mass index is 25.76 kg/(m^2).  General Appearance: Casual  Eye Contact:  Minimal  Speech:  Slow  Volume:  Decreased  Mood:  Euthymic  Affect:  Flat  Thought Process:  Goal Directed  Orientation:  Full (Time, Place, and Person)  Thought Content:  Tangential  Suicidal Thoughts:  No  Homicidal Thoughts:  No  Memory:  Immediate;   Fair Recent;   Fair Remote;   Fair  Judgement:  Fair  Insight:  Lacking  Psychomotor Activity:  Decreased  Concentration:  Concentration: Fair  Recall:  [employer] of Knowledge:  Fair  Language:  Fair  Akathisia:  No  Handed:  Right  AIMS (if indicated):     Assets:  Desire for Improvement Housing Physical Health Resilience  ADL's:  Intact  Cognition:  Impaired,  Mild  Sleep:         Treatment Plan Summary: Plan 30 year old man with a history of schizophrenia. Came in with a complaint about racing thoughts. He has not shown any dangerous behavior in the emergency room. On reevaluation today the patient says his symptoms are better. He denies any suicidal or homicidal thoughts. He is blunted and negative but able to engage in conversation. Doesn't appear to be obviously responding to internal stimuli. Patient doesn't clearly meet commitment criteria. Offered him to consider whether hospitalization would be useful and he would prefer to not have hospitalization at this point. He is to go back and [facility] and with [facility]. No other change to medicine. Case reviewed with the ER doctor. Patient can be discharged from the emergency room.  Disposition: Patient does not meet criteria for psychiatric inpatient admission. Supportive therapy provided about ongoing stressors.  Alethia Berthold, MD 04/27/2016 11:39 AM

## 2016-04-27 NOTE — ED Notes (Signed)

## 2016-04-27 NOTE — ED Notes (Signed)
Pt brought into ED BHU via sally port and wand with metal detector for safety by ODS officer. Patient oriented to unit/care area: Pt informed of unit policies and procedures.  Informed that, for their safety, care areas are designed for safety and monitored by security cameras at all times; and visiting hours explained to patient. Patient verbalizes understanding, and verbal contract for safety obtained.Pt shown to their room.  

## 2016-04-27 NOTE — ED Notes (Signed)
Patient awake, alert, and oriented. He is currently denying SI. Denies hallucinations. Patient states he was concerned when yesterday he started having racing thoughts but now this does not seem to be a problem. Patient says his antipsychotic medication is being decreased as he is being switched to long acting injectable.  Patient maintained on all safety checks.

## 2016-04-27 NOTE — ED Notes (Signed)
ENVIRONMENTAL ASSESSMENT  Potentially harmful objects out of patient reach: Yes.  Personal belongings secured: Yes.  Patient dressed in hospital provided attire only: Yes.  Plastic bags out of patient reach: Yes.  Patient care equipment (cords, cables, call bells, lines, and drains) shortened, removed, or accounted for: Yes.  Equipment and supplies removed from bottom of stretcher: Yes.  Potentially toxic materials out of patient reach: Yes.  Sharps container removed or out of patient reach: Yes.   APPEARANCE/BEHAVIOR  calm, cooperative and adequate rapport can be established  NEURO ASSESSMENT  Orientation: time, place and person  Hallucinations: No.None noted (Hallucinations)  Speech: Normal  Gait: normal  RESPIRATORY ASSESSMENT  WNL  CARDIOVASCULAR ASSESSMENT  WNL  GASTROINTESTINAL ASSESSMENT  WNL  EXTREMITIES  ROM of all joints is normal  PLAN OF CARE  Provide calm/safe environment. Vital signs assessed twice daily. ED BHU Assessment once each 12-hour shift. Collaborate with intake RN daily or as condition indicates. Assure the ED provider has rounded once each shift. Provide and encourage hygiene. Provide redirection as needed. Assess for escalating behavior; address immediately and inform ED provider.  Assess family dynamic and appropriateness for visitation as needed: Yes. ; If necessary, describe findings:  Educate the patient/family about BHU procedures/visitation: Yes. ; If necessary, describe findings:   BEHAVIORAL HEALTH ROUNDING  Patient sleeping: No.  Patient alert and oriented: yes  Behavior appropriate: Yes. ; If no, describe:  Nutrition and fluids offered: Yes  Toileting and hygiene offered: Yes  Sitter present: not applicable, Q 15 min safety rounds and observation via security camera. Law enforcement present: Yes ODS

## 2016-04-27 NOTE — ED Notes (Signed)
Patient meeting with psychiatrist. Maintained on 15 minute checks and observation by security camera for safety. 

## 2016-04-27 NOTE — ED Notes (Signed)
Patient discharged ambulatory to self. He denies SI or HI. Discharge instructions reviewed with patient, he verbalizes understanding. Patient received copy of DC plan and all personal belongings. 

## 2016-06-08 ENCOUNTER — Encounter: Payer: Self-pay | Admitting: Emergency Medicine

## 2016-06-08 ENCOUNTER — Emergency Department
Admission: EM | Admit: 2016-06-08 | Discharge: 2016-06-08 | Disposition: A | Discharge status: Home / Self Care | Payer: Medicare Other | Attending: Emergency Medicine | Admitting: Emergency Medicine

## 2016-06-08 DIAGNOSIS — F209 Schizophrenia, unspecified: Secondary | ICD-10-CM | POA: Diagnosis not present

## 2016-06-08 DIAGNOSIS — F419 Anxiety disorder, unspecified: Secondary | ICD-10-CM

## 2016-06-08 DIAGNOSIS — F329 Major depressive disorder, single episode, unspecified: Secondary | ICD-10-CM

## 2016-06-08 DIAGNOSIS — F418 Other specified anxiety disorders: Secondary | ICD-10-CM | POA: Diagnosis not present

## 2016-06-08 DIAGNOSIS — Z046 Encounter for general psychiatric examination, requested by authority: Secondary | ICD-10-CM | POA: Diagnosis present

## 2016-06-08 DIAGNOSIS — F17210 Nicotine dependence, cigarettes, uncomplicated: Secondary | ICD-10-CM | POA: Insufficient documentation

## 2016-06-08 DIAGNOSIS — F32A Depression, unspecified: Secondary | ICD-10-CM

## 2016-06-08 LAB — COMPREHENSIVE METABOLIC PANEL
ALT: 15 U/L — AB (ref 17–63)
ANION GAP: 7 (ref 5–15)
AST: 23 U/L (ref 15–41)
Albumin: 4.5 g/dL (ref 3.5–5.0)
Alkaline Phosphatase: 65 U/L (ref 38–126)
BUN: 15 mg/dL (ref 6–20)
CHLORIDE: 106 mmol/L (ref 101–111)
CO2: 25 mmol/L (ref 22–32)
CREATININE: 0.96 mg/dL (ref 0.61–1.24)
Calcium: 9.4 mg/dL (ref 8.9–10.3)
GFR calc non Af Amer: >60 mL/min (ref >60)
Glucose, Bld: 90 mg/dL (ref 65–99)
POTASSIUM: 3.9 mmol/L (ref 3.5–5.1)
SODIUM: 138 mmol/L (ref 135–145)
Total Bilirubin: 0.7 mg/dL (ref 0.3–1.2)
Total Protein: 7.1 g/dL (ref 6.5–8.1)

## 2016-06-08 LAB — URINE DRUG SCREEN, QUALITATIVE (ARMC ONLY)
AMPHETAMINES, UR SCREEN: NOT DETECTED
Barbiturates, Ur Screen: NOT DETECTED
Benzodiazepine, Ur Scrn: NOT DETECTED
COCAINE METABOLITE, UR ~~LOC~~: NOT DETECTED
Cannabinoid 50 Ng, Ur ~~LOC~~: NOT DETECTED
MDMA (ECSTASY) UR SCREEN: NOT DETECTED
METHADONE SCREEN, URINE: NOT DETECTED
Opiate, Ur Screen: NOT DETECTED
Phencyclidine (PCP) Ur S: NOT DETECTED
TRICYCLIC, UR SCREEN: NOT DETECTED

## 2016-06-08 LAB — CBC
HCT: 46 % (ref 40.0–52.0)
HEMOGLOBIN: 15.8 g/dL (ref 13.0–18.0)
MCH: 30.5 pg (ref 26.0–34.0)
MCHC: 34.3 g/dL (ref 32.0–36.0)
MCV: 88.9 fL (ref 80.0–100.0)
PLATELETS: 266 10*3/uL (ref 150–440)
RBC: 5.18 MIL/uL (ref 4.40–5.90)
RDW: 13.1 % (ref 11.5–14.5)
WBC: 8.2 10*3/uL (ref 3.8–10.6)

## 2016-06-08 LAB — ETHANOL

## 2016-06-08 LAB — ACETAMINOPHEN LEVEL

## 2016-06-08 LAB — SALICYLATE LEVEL

## 2016-06-08 NOTE — BH Assessment (Signed)
Assessment Note  Edwin Montes is an 30 y.o. male who presents to the ER via EMS due to having racing thoughts. Patient states it started a day ago. "It happens every now and again." It started happening approximately two years ago. During that time, patient transitioned from his Group Home to living independently. He reports the "racing thoughts" are not keeping him from sleep, nor is it interfering with his quality of life. "I just have depressive thoughts." He further explains the depressive thoughts are; not being good enough and he's not cable of doing things. Patient denies SI/HI and V/H. He endorses A/H.  He has history of schizophrenia. During the interview, his thoughts were somewhat disorganized. He had a flat affect and this is base line for him.  Patient is currently an open client with [facility] and is under the psychiatric care of Dr. Georjean Mode. He states his next appointment is today (06/08/2016) at 9:00am. He's also with a PSR Day Program, "[facility] [employer] He attends on a daily basis. Monday thru Friday. Per his report, "it's going well."  Patient parents lives in [place], [place] and he talks with them "about 3 times a week." He identified them as his primary support.   Diagnosis: Schizophrenia  Past Medical History:  Past Medical History:  Diagnosis Date  . Anxiety   . Schizophrenia [facility])     Past Surgical History:  Procedure Laterality Date  . NO PAST SURGERIES      Family History: No family history on file.  Social History:  reports that he has been smoking Cigarettes.  He has never used smokeless tobacco. He reports that he does not drink alcohol or use drugs.  Additional Social History:  Alcohol / Drug Use Pain Medications: See PTA Prescriptions: See PTA Over the Counter: See PTA History of alcohol / drug use?: No history of alcohol / drug abuse (Denies Past and current use of mind altering substances.) Longest period of sobriety (when/how long): Reports of none Negative  Consequences of Use:  (Reports of none) Withdrawal Symptoms:  (Reports of none)  CIWA: CIWA-Ar BP: 130/66 Pulse Rate: 88 COWS:    Allergies: No Known Allergies  Home Medications:  (Not in a hospital admission)  OB/GYN Status:  No LMP for male patient.  General Assessment Data Location of Assessment: [facility] ED TTS Assessment: In system Is this a Tele or Face-to-Face Assessment?: Face-to-Face Is this an Initial Assessment or a Re-assessment for this encounter?: Initial Assessment Marital status: Single Maiden name: n/a Is patient pregnant?: No Pregnancy Status: No Living Arrangements: Alone Can pt return to current living arrangement?: Yes Admission Status: Voluntary Is patient capable of signing voluntary admission?: Yes Referral Source: Self/Family/Friend Insurance type: Medicare  Medical Screening Exam [facility] Walk-in ONLY) Medical Exam completed: Yes  Crisis Care Plan Living Arrangements: Alone Legal Guardian: Other: (Reports of none) Name of Psychiatrist: Dr. Georjean Mode Name of Therapist: [facility]   Education Status Is patient currently in school?: No Current Grade: n/a Highest grade of school patient has completed: some college Name of school: [employer] Contact person: n/a  Risk to self with the past 6 months Suicidal Ideation: No Has patient been a risk to self within the past 6 months prior to admission? : No Suicidal Intent: No Has patient had any suicidal intent within the past 6 months prior to admission? : No Is patient at risk for suicide?: No Suicidal Plan?: No Has patient had any suicidal plan within the past 6 months prior to  admission? : No Access to Means: No What has been your use of drugs/alcohol within the last 12 months?: Reports of none Previous Attempts/Gestures: No How many times?: 0 Other Self Harm Risks: Reports of none Triggers for Past Attempts: None known Intentional Self Injurious Behavior: None Family Suicide History:  No Recent stressful life event(s): Other (Comment) (Reports of none) Persecutory voices/beliefs?: No Depression: Yes Depression Symptoms: Feeling worthless/self pity, Isolating Substance abuse history and/or treatment for substance abuse?: No Suicide prevention information given to non-admitted patients: Not applicable  Risk to Others within the past 6 months Homicidal Ideation: No Does patient have any lifetime risk of violence toward others beyond the six months prior to admission? : No Thoughts of Harm to Others: No Current Homicidal Intent: No Current Homicidal Plan: No Access to Homicidal Means: No Identified Victim: Reports of none History of harm to others?: No Violent Behavior Description: Reports of none Does patient have access to weapons?: No Criminal Charges Pending?: No Does patient have a court date: No Is patient on probation?: No  Psychosis Hallucinations: None noted Delusions: None noted  Mental Status Report Appearance/Hygiene: In scrubs, In hospital gown, Unremarkable Eye Contact: Fair Motor Activity: Freedom of movement, Unremarkable Speech: Logical/coherent, Unremarkable Level of Consciousness: Alert Mood: Helpless, Despair Affect: Flat Anxiety Level: Minimal Thought Processes: Irrelevant, Coherent Judgement: Partial Orientation: Person, Place, Time, Situation, Appropriate for developmental age Obsessive Compulsive Thoughts/Behaviors: Minimal  Cognitive Functioning Concentration: Normal Memory: Recent Intact, Remote Intact IQ: Average Insight: Fair Impulse Control: Fair Appetite: Good Weight Loss: 0 Weight Gain: 0 Sleep: No Change Total Hours of Sleep: 8 Vegetative Symptoms: None  ADLScreening [facility] Assessment Services) Patient's cognitive ability adequate to safely complete daily activities?: Yes Patient able to express need for assistance with ADLs?: Yes Independently performs ADLs?: Yes (appropriate for developmental age)  Prior  Inpatient Therapy Prior Inpatient Therapy: Yes Prior Therapy Dates: 2017, 2014, 2012 , 2009 Prior Therapy Facilty/Provider(s): [place], [place], [place], [facility] hospital, [facility] Reason for Treatment: Schizophrenia, depression  Prior Outpatient Therapy Prior Outpatient Therapy: Yes Prior Therapy Dates: current Prior Therapy Facilty/Provider(s): [facility] Reason for Treatment: Depression, Schizophrenia Does patient have an ACCT team?: No Does patient have Intensive In-House Services?  : No Does patient have [facility] services? : No Does patient have P4CC services?: No  ADL Screening (condition at time of admission) Patient's cognitive ability adequate to safely complete daily activities?: Yes Is the patient deaf or have difficulty hearing?: No Does the patient have difficulty seeing, even when wearing glasses/contacts?: No Does the patient have difficulty concentrating, remembering, or making decisions?: No Patient able to express need for assistance with ADLs?: Yes Does the patient have difficulty dressing or bathing?: No Independently performs ADLs?: Yes (appropriate for developmental age) Does the patient have difficulty walking or climbing stairs?: No Weakness of Legs: None Weakness of Arms/Hands: None  Home Assistive Devices/Equipment Home Assistive Devices/Equipment: None  Therapy Consults (therapy consults require a physician order) PT Evaluation Needed: No OT Evalulation Needed: No SLP Evaluation Needed: No Abuse/Neglect Assessment (Assessment to be complete while patient is alone) Physical Abuse: Denies Verbal Abuse: Denies Sexual Abuse: Denies Exploitation of patient/patient's resources: Denies Self-Neglect: Denies Values / Beliefs Cultural Requests During Hospitalization: None Spiritual Requests During Hospitalization: None Consults Spiritual Care Consult Needed: No Social Work Consult Needed: No Merchant navy officer (For Healthcare) Does patient have an advance  directive?: No Would patient like information on creating an advanced directive?: No - patient declined information    Additional Information 1:1 In Past  12 Months?: No CIRT Risk: No Elopement Risk: No Does patient have medical clearance?: Yes  Child/Adolescent Assessment Running Away Risk: Denies (Patient is an adult)  Disposition:  Disposition Initial Assessment Completed for this Encounter: Yes  On Site Evaluation by:   Reviewed with Physician:    Lilyan Gilford MS, LCAS, LPC, NCC, CCSI Therapeutic Triage Specialist 06/08/2016 3:16 AM

## 2016-06-08 NOTE — ED Notes (Signed)
BEHAVIORAL HEALTH ROUNDING Patient sleeping: Yes.   Patient alert and oriented: not applicable SLEEPING Behavior appropriate: Yes.  ; If no, describe: SLEEPING Nutrition and fluids offered: No SLEEPING Toileting and hygiene offered: NoSLEEPING Sitter present: not applicable, Q 15 min safety rounds and observation via security camera. Law enforcement present: Yes ODS 

## 2016-06-08 NOTE — ED Provider Notes (Signed)
[facility] Emergency Department Provider Note   ____________________________________________   First MD Initiated Contact with Patient 06/08/16 [phone]     (approximate)  I have reviewed the triage vital signs and the nursing notes.   HISTORY  Chief Complaint Psychiatric Evaluation    HPI Edwin Montes is a 30 y.o. male with a history of schizophrenia who presents to the [employer]  police for having "racing thoughts". Patient states he is compliant with taking his Abilify; reports he was feeling depressed so he called EMS to seek a behavioral evaluation. Denies prior history of suicide attempt. Denies active SI/HI/AH/VH. Voices no medical complaints.   Past Medical History:  Diagnosis Date  . Anxiety   . Schizophrenia [facility]     Patient Active Problem List   Diagnosis Date Noted  . Noncompliance 02/29/2016  . 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 Tobacco use disorder 01/27/2016    Past Surgical History:  Procedure Laterality Date  . NO PAST SURGERIES      Prior to Admission medications   Medication Sig Start Date End Date Taking? Authorizing Provider  ARIPiprazole (ABILIFY) 5 MG tablet Take 5 mg by mouth daily.    Historical Provider, MD  ARIPiprazole 400 MG SUSR Inject 400 mg into the muscle every 28 (twenty-eight) days. 03/02/16   Shari Prows, MD    Allergies Review of patient's allergies indicates no known allergies.  No family history on file.  Social History Social History  Substance Use Topics  . Smoking status: Current Some Day Smoker    Types: Cigarettes  . Smokeless tobacco: Never Used  . Alcohol use No    Review of Systems  Constitutional: No fever/chills. Eyes: No visual changes. ENT: No sore throat. Cardiovascular: Denies chest pain. Respiratory: Denies shortness of breath. Gastrointestinal: No abdominal pain.  No nausea, no vomiting.  No diarrhea.  No constipation. Genitourinary:  Negative for dysuria. Musculoskeletal: Negative for back pain. Skin: Negative for rash. Neurological: Negative for headaches, focal weakness or numbness. Psychiatric:Positive for depression without SI.  10-point ROS otherwise negative.  ____________________________________________   PHYSICAL EXAM:  VITAL SIGNS: ED Triage Vitals   Enc Vitals Group     BP 130/66     Pulse Rate 88     Resp 18     Temp 98.3 F (36.8 C)     Temp Source Oral     SpO2 99 %     Weight 213 lb (96.6 kg)     Height  (1.88 m)     Head Circumference      Peak Flow      Pain Score      Pain Loc      Pain Edu?      Excl. in GC?     Constitutional: Alert and oriented. Well appearing and in no acute distress. Eyes: Conjunctivae are normal. PERRL. EOMI. Head: Atraumatic. Nose: No congestion/rhinnorhea. Mouth/Throat: Mucous membranes are moist.  Oropharynx non-erythematous. Neck: No stridor.   Cardiovascular: Normal rate, regular rhythm. Grossly normal heart sounds.  Good peripheral circulation. Respiratory: Normal respiratory effort.  No retractions. Lungs CTAB. Gastrointestinal: Soft and nontender. No distention. No abdominal bruits. No CVA tenderness. Musculoskeletal: No lower extremity tenderness nor edema.  No joint effusions. Neurologic:  Normal speech and language. No gross focal neurologic deficits are appreciated. No gait instability. Skin:  Skin is warm, dry and intact. No rash noted. Psychiatric: Mood and affect are flat. Speech and behavior are normal.  ____________________________________________  LABS (all labs ordered are listed, but only abnormal results are displayed)  Labs Reviewed  COMPREHENSIVE METABOLIC PANEL - Abnormal; Notable for the following:       Result Value   ALT 15 (*)    All other components within normal limits  ACETAMINOPHEN LEVEL - Abnormal; Notable for the following:    Acetaminophen (Tylenol), Serum <10 (*)    All other components within normal  limits  ETHANOL  SALICYLATE LEVEL  CBC  URINE DRUG SCREEN, QUALITATIVE ([facility] ONLY)   ____________________________________________  EKG  None ____________________________________________  RADIOLOGY  None ____________________________________________   PROCEDURES  Procedure(s) performed: None  Procedures  Critical Care performed: No  ____________________________________________   INITIAL IMPRESSION / ASSESSMENT AND PLAN / ED COURSE  Pertinent labs & imaging results that were available during my care of the patient were reviewed by me and considered in my medical decision making (see chart for details).  30 year old male with a history of schizophrenia, on Abilify who presents voluntarily for behavioral health evaluation for depression and racing thoughts. He denies active SI and contracts for safety while in the emergency department. For this reason I have removed the 1:1 sitter and we will instill 15 minutes safety checks. Patient will be transferred to the [facility] pending TTS and psychiatry evaluations.  Clinical Course  Comment By Time  Patient was evaluated by TTS [name]. Reportedly patient participates in a day program and also has an appointment at [facility] at 10 AM. Will hold patient overnight and discharge in the morning for his appointment. Irean Hong, MD 07/27 [phone]  No further events. Anticipate patient will be discharged later this morning in time for his appointment at [facility]. Irean Hong, MD 07/27 [phone]     ____________________________________________   FINAL CLINICAL IMPRESSION(S) / ED DIAGNOSES  Final diagnoses:  Depression  Schizophrenia, unspecified type (HCC)      NEW MEDICATIONS STARTED DURING THIS VISIT:  New Prescriptions   No medications on file     Note:  This document was prepared using Dragon voice recognition software and may include unintentional dictation errors.    Irean Hong, MD 06/08/16 [phone]

## 2016-06-08 NOTE — ED Triage Notes (Addendum)
Pt ambulatory to triage with steady gait. Pt brought in by [employer], pt is voluntary. States has been having "racing thoughts." Pt reports was feeling depressed so he called EMS and states "I know I wasn't going to hurt myself because I know me."  Pt denies HI or SI, pt reports thoughts are "not physical thoughts they are just thoughts." Pt is calm and cooperative at this time. Pt in no apparent distress at this time.

## 2016-06-08 NOTE — ED Notes (Signed)
Patient discharged ambulatory to self. He denies SI or HI. Discharge instructions reviewed with staff, he verbalizes understanding. Patient received copy of DC plan and all personal belongings.

## 2016-06-08 NOTE — Discharge Instructions (Signed)
Proceed to Dr. Wylene Simmer office for your appointment at 9 AM. Return for any new or worsening concerns.

## 2016-06-08 NOTE — ED Notes (Signed)
ENVIRONMENTAL ASSESSMENT  Potentially harmful objects out of patient reach: Yes.  Personal belongings secured: Yes.  Patient dressed in hospital provided attire only: Yes.  Plastic bags out of patient reach: Yes.  Patient care equipment (cords, cables, call bells, lines, and drains) shortened, removed, or accounted for: Yes.  Equipment and supplies removed from bottom of stretcher: Yes.  Potentially toxic materials out of patient reach: Yes.  Sharps container removed or out of patient reach: Yes.   BEHAVIORAL HEALTH ROUNDING  Patient sleeping: No.  Patient alert and oriented: yes  Behavior appropriate: Yes. ; If no, describe:  Nutrition and fluids offered: Yes  Toileting and hygiene offered: Yes  Sitter present: not applicable, Q 15 min safety rounds and observation via security camera. Law enforcement present: Yes ODS  Pt brought into ED BHU via sally port and wand with metal detector for safety by ODS officer. Patient oriented to unit/care area: Pt informed of unit policies and procedures.  Informed that, for their safety, care areas are designed for safety and monitored by security cameras at all times; and visiting hours explained to patient. Patient verbalizes understanding, and verbal contract for safety obtained.Pt shown to their room.    

## 2016-06-08 NOTE — ED Provider Notes (Signed)
-----------------------------------------   7:25 AM on 06/08/2016 -----------------------------------------  Patient to be discharged to his psychiatric appointment with Dr. Georjean Mode this morning at 9 am. DC home.   Gayla Doss, MD 06/08/16 [phone]

## 2016-06-08 NOTE — ED Notes (Signed)

## 2016-06-12 ENCOUNTER — Emergency Department
Admission: EM | Admit: 2016-06-12 | Discharge: 2016-06-13 | Disposition: A | Discharge status: Home / Self Care | Payer: Medicare Other | Attending: Emergency Medicine | Admitting: Emergency Medicine

## 2016-06-12 DIAGNOSIS — R440 Auditory hallucinations: Secondary | ICD-10-CM | POA: Diagnosis not present

## 2016-06-12 DIAGNOSIS — F4320 Adjustment disorder, unspecified: Secondary | ICD-10-CM | POA: Diagnosis not present

## 2016-06-12 DIAGNOSIS — R4182 Altered mental status, unspecified: Secondary | ICD-10-CM | POA: Diagnosis present

## 2016-06-12 DIAGNOSIS — F17210 Nicotine dependence, cigarettes, uncomplicated: Secondary | ICD-10-CM | POA: Diagnosis not present

## 2016-06-12 DIAGNOSIS — R443 Hallucinations, unspecified: Secondary | ICD-10-CM

## 2016-06-12 NOTE — ED Triage Notes (Signed)
Pt ambulatory to ED via [facility]. Per EMS pt has been having "racing thoughts" all day, and wanted to come in to be evaluated. Pt has hx of schizophrenia; pt denies SI & HI, but states he is hearing voices. Pt alert and oriented in no acute distress at this time.

## 2016-06-13 DIAGNOSIS — R440 Auditory hallucinations: Secondary | ICD-10-CM | POA: Diagnosis not present

## 2016-06-13 LAB — BASIC METABOLIC PANEL
Anion gap: 6 (ref 5–15)
BUN: 15 mg/dL (ref 6–20)
CALCIUM: 8.9 mg/dL (ref 8.9–10.3)
CO2: 28 mmol/L (ref 22–32)
CREATININE: 0.84 mg/dL (ref 0.61–1.24)
Chloride: 105 mmol/L (ref 101–111)
GFR calc non Af Amer: >60 mL/min (ref >60)
GLUCOSE: 85 mg/dL (ref 65–99)
Potassium: 4 mmol/L (ref 3.5–5.1)
Sodium: 139 mmol/L (ref 135–145)

## 2016-06-13 LAB — URINE DRUG SCREEN, QUALITATIVE (ARMC ONLY)
Amphetamines, Ur Screen: NOT DETECTED
Barbiturates, Ur Screen: NOT DETECTED
Benzodiazepine, Ur Scrn: NOT DETECTED
CANNABINOID 50 NG, UR ~~LOC~~: POSITIVE — AB
COCAINE METABOLITE, UR ~~LOC~~: NOT DETECTED
MDMA (ECSTASY) UR SCREEN: NOT DETECTED
Methadone Scn, Ur: NOT DETECTED
Opiate, Ur Screen: NOT DETECTED
PHENCYCLIDINE (PCP) UR S: NOT DETECTED
Tricyclic, Ur Screen: NOT DETECTED

## 2016-06-13 LAB — CBC
HEMATOCRIT: 42.7 % (ref 40.0–52.0)
Hemoglobin: 15.1 g/dL (ref 13.0–18.0)
MCH: 31.7 pg (ref 26.0–34.0)
MCHC: 35.4 g/dL (ref 32.0–36.0)
MCV: 89.6 fL (ref 80.0–100.0)
PLATELETS: 235 10*3/uL (ref 150–440)
RBC: 4.76 MIL/uL (ref 4.40–5.90)
RDW: 13.2 % (ref 11.5–14.5)
WBC: 8.7 10*3/uL (ref 3.8–10.6)

## 2016-06-13 LAB — ETHANOL: Alcohol, Ethyl (B): <5 mg/dL (ref <5)

## 2016-06-13 LAB — ACETAMINOPHEN LEVEL: Acetaminophen (Tylenol), Serum: <10 ug/mL — ABNORMAL LOW (ref 10–30)

## 2016-06-13 LAB — SALICYLATE LEVEL: Salicylate Lvl: <4 mg/dL (ref 2.8–30.0)

## 2016-06-13 MED ORDER — HYDROXYZINE PAMOATE 25 MG PO CAPS: 25.0000 mg | ORAL_CAPSULE | Freq: Three times a day (TID) | ORAL | 0 refills | Status: DC | PRN

## 2016-06-13 NOTE — ED Notes (Signed)
Pt informed to return if any life threatening symptoms occur.  

## 2016-06-13 NOTE — BH Assessment (Signed)
Assessment Note  Edwin Montes is an 30 y.o. male. Edwin Montes arrived to the ED this evening by way of EMS.  He reports that he came to the ED due to racing thoughts. He denied symptoms of depression.  He denied symptoms of anxiety.  He denied having auditory or visual hallucinations. He denied suicidal or homicidal ideation or intent.  He denied the use of alcohol or drugs. He denied facing any additional stressors at this time. He denied a trigger for his racing thoughts.  He states that the racing thoughts have been occurring for "the last few years".  Edwin Montes reported to the ED on 06/08/2016 for the same symptoms.  Diagnosis: Anxiety, Schizophrenia  Past Medical History:  Past Medical History:  Diagnosis Date  . Anxiety   . Schizophrenia [facility])     Past Surgical History:  Procedure Laterality Date  . NO PAST SURGERIES      Family History: History reviewed. No pertinent family history.  Social History:  reports that he has been smoking Cigarettes.  He has never used smokeless tobacco. He reports that he does not drink alcohol or use drugs.  Additional Social History:  Alcohol / Drug Use History of alcohol / drug use?: No history of alcohol / drug abuse  CIWA: CIWA-Ar BP: (!) 91/51 Pulse Rate: 62 COWS:    Allergies: No Known Allergies  Home Medications:  (Not in a hospital admission)  OB/GYN Status:  No LMP for male patient.  General Assessment Data Location of Assessment: [facility] ED TTS Assessment: In system Is this a Tele or Face-to-Face Assessment?: Face-to-Face Is this an Initial Assessment or a Re-assessment for this encounter?: Initial Assessment Marital status: Single Maiden name: n/a Is patient pregnant?: No Pregnancy Status: No Living Arrangements: Alone Can pt return to current living arrangement?: Yes Admission Status: Voluntary Is patient capable of signing voluntary admission?: Yes Referral Source: Self/Family/Friend Insurance type: Medicare  Medical  Screening Exam [facility] Walk-in ONLY) Medical Exam completed: Yes  Crisis Care Plan Living Arrangements: Alone Legal Guardian: Other: (Self) Name of Psychiatrist: Dr. Georjean Mode Name of Therapist: [facility]   Education Status Is patient currently in school?: No Current Grade: n/a Highest grade of school patient has completed: Some Automotive engineer Name of school: [employer] Contact person: n/a  Risk to self with the past 6 months Suicidal Ideation: No Has patient been a risk to self within the past 6 months prior to admission? : No Suicidal Intent: No Has patient had any suicidal intent within the past 6 months prior to admission? : No Is patient at risk for suicide?: No Suicidal Plan?: No Has patient had any suicidal plan within the past 6 months prior to admission? : No Access to Means: No What has been your use of drugs/alcohol within the last 12 months?: denied use Previous Attempts/Gestures: No How many times?: 0 Other Self Harm Risks: denied Triggers for Past Attempts: None known Intentional Self Injurious Behavior: None Family Suicide History: No Recent stressful life event(s):  (None reported) Persecutory voices/beliefs?: No Depression: No Depression Symptoms:  (Racing thoughts) Substance abuse history and/or treatment for substance abuse?: No Suicide prevention information given to non-admitted patients: Not applicable  Risk to Others within the past 6 months Homicidal Ideation: No Does patient have any lifetime risk of violence toward others beyond the six months prior to admission? : No Thoughts of Harm to Others: No Current Homicidal Intent: No Current Homicidal Plan: No Access to Homicidal Means: No Identified Victim: None  identified History of harm to others?: No Assessment of Violence: None Noted Violent Behavior Description: denied Does patient have access to weapons?: No Criminal Charges Pending?: No Does patient have a court date: No Is patient on  probation?: No  Psychosis Hallucinations: None noted (Denied to TTS, reported to ED physcian) Delusions: None noted  Mental Status Report Appearance/Hygiene: Unremarkable Eye Contact: Poor Motor Activity: Freedom of movement Speech: Logical/coherent, Unremarkable Level of Consciousness: Drowsy Mood: Euthymic Affect: Appropriate to circumstance Anxiety Level: None Thought Processes: Coherent Judgement: Unimpaired Orientation: Person, Place, Time, Situation Obsessive Compulsive Thoughts/Behaviors: None  Cognitive Functioning Concentration: Normal Memory: Recent Intact IQ: Average Insight: Fair Impulse Control: Fair Appetite: Good Sleep: No Change Vegetative Symptoms: None  ADLScreening [facility] Assessment Services) Patient's cognitive ability adequate to safely complete daily activities?: Yes Patient able to express need for assistance with ADLs?: Yes Independently performs ADLs?: Yes (appropriate for developmental age)  Prior Inpatient Therapy Prior Inpatient Therapy: Yes Prior Therapy Dates: 2017, 2014, 2012 , 2009 Prior Therapy Facilty/Provider(s): [facility], [place], [facility], [facility] Reason for Treatment: Schizophrenia, depression  Prior Outpatient Therapy Prior Outpatient Therapy: Yes Prior Therapy Dates: current Prior Therapy Facilty/Provider(s): [facility] Reason for Treatment: Depression, Schizophrenia Does patient have an ACCT team?: No Does patient have Intensive In-House Services?  : No Does patient have [facility] services? : No Does patient have P4CC services?: No  ADL Screening (condition at time of admission) Patient's cognitive ability adequate to safely complete daily activities?: Yes Patient able to express need for assistance with ADLs?: Yes Independently performs ADLs?: Yes (appropriate for developmental age)       Abuse/Neglect Assessment (Assessment to be complete while patient is alone) Physical Abuse: Denies Verbal Abuse:  Denies Sexual Abuse: Denies Exploitation of patient/patient's resources: Denies Self-Neglect: Denies     Merchant navy officer (For Healthcare) Does patient have an advance directive?: Yes Type of Advance Directive: Living will Does patient want to make changes to advanced directive?: No - Patient declined Copy of advanced directive(s) in chart?: No - copy requested    Additional Information 1:1 In Past 12 Months?: No CIRT Risk: No Elopement Risk: No Does patient have medical clearance?: Yes     Disposition:  Disposition Initial Assessment Completed for this Encounter: Yes Disposition of Patient: Other dispositions  On Site Evaluation by:   Reviewed with Physician:    Justice Deeds 06/13/2016 3:39 AM

## 2016-06-13 NOTE — ED Provider Notes (Signed)
[facility] Emergency Department Provider Note   ____________________________________________   First MD Initiated Contact with Patient 06/13/16 0037     (approximate)  I have reviewed the triage vital signs and the nursing notes.   HISTORY  Chief Complaint Altered Mental Status (psych evaluation)    HPI Edwin Montes is a 30 y.o. male comes into the hospital today with racing thoughts. He reports that the symptoms started earlier today. The patient does have a history of schizophrenia. He reports that he is going through some things and he's been hearing voices. He reports that he really sees the voices more than hears them but they don't tell him a whole lot of anything. He reports that he's been taking his medications without difficulty. The patient has a psychiatrist that he sees and saw him last Thursday. He did not call his second doctor today. The patient denies any suicidal or homicidal ideation and reports that he hasn't been drinking or doing any drugs. The patient reports that he just wanted to come in and get checked out and evaluated.   Past Medical History:  Diagnosis Date  . Anxiety   . Schizophrenia [facility])     Patient Active Problem List   Diagnosis Date Noted  . Noncompliance 02/29/2016  . 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 Tobacco use disorder 01/27/2016    Past Surgical History:  Procedure Laterality Date  . NO PAST SURGERIES      Prior to Admission medications   Medication Sig Start Date End Date Taking? Authorizing Provider  ARIPiprazole (ABILIFY) 5 MG tablet Take 5 mg by mouth daily.    Historical Provider, MD  ARIPiprazole 400 MG SUSR Inject 400 mg into the muscle every 28 (twenty-eight) days. 03/02/16   Shari Prows, MD  hydrOXYzine (VISTARIL) 25 MG capsule Take 1 capsule (25 mg total) by mouth every 8 (eight) hours as needed for anxiety. 06/13/16   Rebecka Apley, MD    Allergies Review of  patient's allergies indicates no known allergies.  History reviewed. No pertinent family history.  Social History Social History  Substance Use Topics  . Smoking status: Current Some Day Smoker    Types: Cigarettes  . Smokeless tobacco: Never Used  . Alcohol use No    Review of Systems Constitutional: No fever/chills Eyes: No visual changes. ENT: No sore throat. Cardiovascular: Denies chest pain. Respiratory: Denies shortness of breath. Gastrointestinal: No abdominal pain.  No nausea, no vomiting.  No diarrhea.  No constipation. Genitourinary: Negative for dysuria. Musculoskeletal: Negative for back pain. Skin: Negative for rash. Neurological: Negative for headaches, focal weakness or numbness. Psychiatric:Racing thoughts and hallucinations  10-point ROS otherwise negative.  ____________________________________________   PHYSICAL EXAM:  VITAL SIGNS: ED Triage Vitals  Enc Vitals Group     BP 06/12/16 2304 128/74     Pulse Rate 06/12/16 2304 92     Resp 06/12/16 2304 18     Temp 06/12/16 2304 98.1 F (36.7 C)     Temp Source 06/12/16 2304 Oral     SpO2 06/12/16 2256 97 %     Weight 06/12/16 2304 208 lb (94.3 kg)     Height 06/12/16 2304 6\' 2"  (1.88 m)     Head Circumference --      Peak Flow --      Pain Score 06/12/16 2357 0     Pain Loc --      Pain Edu? --      Excl. in GC? --  Constitutional: Alert and oriented. Well appearing and in no acute distress. Eyes: Conjunctivae are normal. PERRL. EOMI. Head: Atraumatic. Nose: No congestion/rhinnorhea. Mouth/Throat: Mucous membranes are moist.  Oropharynx non-erythematous. Cardiovascular: Normal rate, regular rhythm. Grossly normal heart sounds.  Good peripheral circulation. Respiratory: Normal respiratory effort.  No retractions. Lungs CTAB. Gastrointestinal: Soft and nontender. No distention. Positive bowel sounds Musculoskeletal: No lower extremity tenderness nor edema.  Neurologic:  Normal speech and  language.  Skin:  Skin is warm, dry and intact.  Psychiatric: Flat affect   ____________________________________________   LABS (all labs ordered are listed, but only abnormal results are displayed)  Labs Reviewed  ACETAMINOPHEN LEVEL - Abnormal; Notable for the following:       Result Value   Acetaminophen (Tylenol), Serum <10 (*)    All other components within normal limits  URINE DRUG SCREEN, QUALITATIVE ([facility] ONLY) - Abnormal; Notable for the following:    Cannabinoid 50 Ng, Ur [place] POSITIVE (*)    All other components within normal limits  CBC  BASIC METABOLIC PANEL  SALICYLATE LEVEL  ETHANOL   ____________________________________________  EKG  none ____________________________________________  RADIOLOGY  none ____________________________________________   PROCEDURES  Procedure(s) performed: None  Procedures  Critical Care performed: No  ____________________________________________   INITIAL IMPRESSION / ASSESSMENT AND PLAN / ED COURSE  Pertinent labs & imaging results that were available during my care of the patient were reviewed by me and considered in my medical decision making (see chart for details).  This is a 30 year old male who comes into the hospital today with some racing thoughts and auditory hallucinations. I will check the patient's blood work and have him evaluated by psych.  Clinical Course   The patient was seen by the specialist on-call. His blood work is unremarkable. He felt that the patient did not need to stay in the hospital and she'll follow up with his psychiatrist as an outpatient in 2-4 weeks. He recommended continuing the Abilify and start patient on Vistaril as well. Otherwise he feels that the patient may be discharged. The patient will be discharged home to follow-up with his doctor.  ____________________________________________   FINAL CLINICAL IMPRESSION(S) / ED DIAGNOSES  Final diagnoses:  Hallucinations  Emotional  crisis      NEW MEDICATIONS STARTED DURING THIS VISIT:  New Prescriptions   HYDROXYZINE (VISTARIL) 25 MG CAPSULE    Take 1 capsule (25 mg total) by mouth every 8 (eight) hours as needed for anxiety.     Note:  This document was prepared using Dragon voice recognition software and may include unintentional dictation errors.    Rebecka Apley, MD 06/13/16 [phone]

## 2016-06-23 ENCOUNTER — Emergency Department
Admission: EM | Admit: 2016-06-23 | Discharge: 2016-06-23 | Disposition: A | Discharge status: Home / Self Care | Payer: Medicare Other | Attending: Emergency Medicine | Admitting: Emergency Medicine

## 2016-06-23 DIAGNOSIS — F203 Undifferentiated schizophrenia: Secondary | ICD-10-CM | POA: Diagnosis not present

## 2016-06-23 DIAGNOSIS — R440 Auditory hallucinations: Secondary | ICD-10-CM | POA: Diagnosis not present

## 2016-06-23 DIAGNOSIS — Z79899 Other long term (current) drug therapy: Secondary | ICD-10-CM | POA: Diagnosis not present

## 2016-06-23 DIAGNOSIS — F410 Panic disorder [episodic paroxysmal anxiety] without agoraphobia: Secondary | ICD-10-CM

## 2016-06-23 DIAGNOSIS — F17210 Nicotine dependence, cigarettes, uncomplicated: Secondary | ICD-10-CM | POA: Diagnosis not present

## 2016-06-23 DIAGNOSIS — Z046 Encounter for general psychiatric examination, requested by authority: Secondary | ICD-10-CM | POA: Diagnosis present

## 2016-06-23 LAB — URINE DRUG SCREEN, QUALITATIVE (ARMC ONLY)
AMPHETAMINES, UR SCREEN: NOT DETECTED
BARBITURATES, UR SCREEN: NOT DETECTED
BENZODIAZEPINE, UR SCRN: NOT DETECTED
Cannabinoid 50 Ng, Ur ~~LOC~~: NOT DETECTED
Cocaine Metabolite,Ur ~~LOC~~: NOT DETECTED
MDMA (Ecstasy)Ur Screen: NOT DETECTED
METHADONE SCREEN, URINE: NOT DETECTED
OPIATE, UR SCREEN: NOT DETECTED
PHENCYCLIDINE (PCP) UR S: NOT DETECTED
Tricyclic, Ur Screen: NOT DETECTED

## 2016-06-23 LAB — CBC
HEMATOCRIT: 44.3 % (ref 40.0–52.0)
Hemoglobin: 15.8 g/dL (ref 13.0–18.0)
MCH: 31.5 pg (ref 26.0–34.0)
MCHC: 35.7 g/dL (ref 32.0–36.0)
MCV: 88.4 fL (ref 80.0–100.0)
PLATELETS: 243 10*3/uL (ref 150–440)
RBC: 5.01 MIL/uL (ref 4.40–5.90)
RDW: 12.8 % (ref 11.5–14.5)
WBC: 8.3 10*3/uL (ref 3.8–10.6)

## 2016-06-23 LAB — ETHANOL

## 2016-06-23 LAB — COMPREHENSIVE METABOLIC PANEL
ALBUMIN: 4.5 g/dL (ref 3.5–5.0)
ALK PHOS: 69 U/L (ref 38–126)
ALT: 17 U/L (ref 17–63)
ANION GAP: 7 (ref 5–15)
AST: 24 U/L (ref 15–41)
BILIRUBIN TOTAL: 0.7 mg/dL (ref 0.3–1.2)
BUN: 19 mg/dL (ref 6–20)
CALCIUM: 9.2 mg/dL (ref 8.9–10.3)
CO2: 28 mmol/L (ref 22–32)
Chloride: 103 mmol/L (ref 101–111)
Creatinine, Ser: 0.88 mg/dL (ref 0.61–1.24)
GFR calc Af Amer: >60 mL/min (ref >60)
GLUCOSE: 93 mg/dL (ref 65–99)
POTASSIUM: 4 mmol/L (ref 3.5–5.1)
Sodium: 138 mmol/L (ref 135–145)
TOTAL PROTEIN: 6.9 g/dL (ref 6.5–8.1)

## 2016-06-23 LAB — SALICYLATE LEVEL: Salicylate Lvl: <4 mg/dL (ref 2.8–30.0)

## 2016-06-23 LAB — ACETAMINOPHEN LEVEL

## 2016-06-23 NOTE — ED Notes (Signed)
Patient resting quietly in room. No noted distress or abnormal behaviors noted. Will continue 15 minute checks and observation by security camera for safety. 

## 2016-06-23 NOTE — Consult Note (Signed)
[facility] Consult   Reason for Consult:  Consult for 30 year old man with a history of schizophrenia who presented to the emergency room voluntarily Referring Physician:  Marcelene Butte Patient Identification: Edwin Montes MRN:  [id number] Principal Diagnosis: Undifferentiated schizophrenia [facility]) Diagnosis:   Patient Active Problem List   Diagnosis Date Noted  . Panic attack [F41.0] 06/23/2016  . Noncompliance [Z91.19] 02/29/2016  . Undifferentiated schizophrenia ([place]) [F20.3] 01/27/2016  . Tobacco use disorder [F17.200] 01/27/2016    Total Time spent with patient: 1 hour  Subjective:   Edwin Montes is a 30 y.o. male patient admitted with "I was just having racing thoughts".  HPI:  Patient interviewed. Chart reviewed labs reviewed. Patient known from prior encounters. This 30 year old man who has schizophrenia says that yesterday he was alone at his apartment when he started to have what he calls racing thoughts. His description of it sounds like he started to get anxious and worried about a variety of things about his life. He can be very specific about any of it. It also sounds like his sensation of auditory hallucinations gets worse during that time. He denies that he had any thoughts about suicide or hurting anyone else. Didn't act on any suicidal impulse or do anything dangerous. He denies that he's been abusing any drugs or alcohol. He says he's been compliant with his medicine. Patient can't really put his finger on any specific new stress although it sounds like it's probably been lonely and difficult for him adapting to living in an independent apartment.  Medical history: No significant ongoing medical problems outside of the psychiatric.  Substance abuse history: Denies alcohol or drug abuse. Doesn't have a past history of substance abuse issues.  Social history: Patient lives in an independent apartment. He has a transition coordinator checks up on him  Past  Psychiatric History: Patient has a history of schizophrenia. Currently on Abilify. He follows up at [facility]. He does have a prior history of psychiatric hospitalization but not in the recent past. Denies any past history of actual suicide attempts.  Risk to Self: Suicidal Ideation: No Suicidal Intent: No Is patient at risk for suicide?: No Suicidal Plan?: No Access to Means: No What has been your use of drugs/alcohol within the last 12 months?: Pt denies drug/alcohol use How many times?: 0 Other Self Harm Risks: None identified Triggers for Past Attempts: None known Intentional Self Injurious Behavior: None Risk to Others: Homicidal Ideation: No Thoughts of Harm to Others: No Current Homicidal Intent: No Current Homicidal Plan: No Access to Homicidal Means: No Identified Victim: None identified History of harm to others?: No Assessment of Violence: None Noted Violent Behavior Description: None identified Does patient have access to weapons?: No Criminal Charges Pending?: No Does patient have a court date: No Prior Inpatient Therapy: Prior Inpatient Therapy: Yes Prior Therapy Dates: 2017, 2014, 2012 , 2009 Prior Therapy Facilty/Provider(s): [facility], [place], [facility], [facility] Reason for Treatment: Schizophrenia, depression Prior Outpatient Therapy: Prior Outpatient Therapy: Yes Prior Therapy Dates: current Prior Therapy Facilty/Provider(s): [facility] Reason for Treatment: Depression, Schizophrenia Does patient have an ACCT team?: No Does patient have Intensive In-House Services?  : No Does patient have [facility] services? : No Does patient have P4CC services?: No  Past Medical History:  Past Medical History:  Diagnosis Date  . Anxiety   . Schizophrenia [facility])     Past Surgical History:  Procedure Laterality Date  . NO PAST SURGERIES     Family History: No family history  on file. Family Psychiatric  History: Family history negative for any Mental Health  Problems Social History:  History  Alcohol Use No     History  Drug Use No    Social History   Social History  . Marital status: Single    Spouse name: N/A  . Number of children: N/A  . Years of education: N/A   Social History Main Topics  . Smoking status: Current Some Day Smoker    Types: Cigarettes  . Smokeless tobacco: Never Used  . Alcohol use No  . Drug use: No  . Sexual activity: No   Other Topics Concern  . None   Social History Narrative  . None   Additional Social History:    Allergies:  No Known Allergies  Labs:  Results for orders placed or performed during the hospital encounter of 06/23/16 (from the past 48 hour(s))  Comprehensive metabolic panel     Status: None   Collection Time: 06/23/16  3:12 AM  Result Value Ref Range   Sodium 138 135 - 145 mmol/L   Potassium 4.0 3.5 - 5.1 mmol/L   Chloride 103 101 - 111 mmol/L   CO2 28 22 - 32 mmol/L   Glucose, Bld 93 65 - 99 mg/dL   BUN 19 6 - 20 mg/dL   Creatinine, Ser 0.88 0.61 - 1.24 mg/dL   Calcium 9.2 8.9 - 10.3 mg/dL   Total Protein 6.9 6.5 - 8.1 g/dL   Albumin 4.5 3.5 - 5.0 g/dL   AST 24 15 - 41 U/L   ALT 17 17 - 63 U/L   Alkaline Phosphatase 69 38 - 126 U/L   Total Bilirubin 0.7 0.3 - 1.2 mg/dL   GFR calc non Af Amer >60 >60 mL/min   GFR calc Af Amer >60 >60 mL/min    Comment: (NOTE) The eGFR has been calculated using the CKD EPI equation. This calculation has not been validated in all clinical situations. eGFR's persistently <60 mL/min signify possible Chronic Kidney Disease.    Anion gap 7 5 - 15  Ethanol     Status: None   Collection Time: 06/23/16  3:12 AM  Result Value Ref Range   Alcohol, Ethyl (B) <5 <5 mg/dL    Comment:        LOWEST DETECTABLE LIMIT FOR SERUM ALCOHOL IS 5 mg/dL FOR MEDICAL PURPOSES ONLY   Salicylate level     Status: None   Collection Time: 06/23/16  3:12 AM  Result Value Ref Range   Salicylate Lvl <0.0 2.8 - 30.0 mg/dL  Acetaminophen level     Status:  Abnormal   Collection Time: 06/23/16  3:12 AM  Result Value Ref Range   Acetaminophen (Tylenol), Serum <10 (L) 10 - 30 ug/mL    Comment:        THERAPEUTIC CONCENTRATIONS VARY SIGNIFICANTLY. A RANGE OF 10-30 ug/mL MAY BE AN EFFECTIVE CONCENTRATION FOR MANY PATIENTS. HOWEVER, SOME ARE BEST TREATED AT CONCENTRATIONS OUTSIDE THIS RANGE. ACETAMINOPHEN CONCENTRATIONS >150 ug/mL AT 4 HOURS AFTER INGESTION AND >50 ug/mL AT 12 HOURS AFTER INGESTION ARE OFTEN ASSOCIATED WITH TOXIC REACTIONS.   cbc     Status: None   Collection Time: 06/23/16  3:12 AM  Result Value Ref Range   WBC 8.3 3.8 - 10.6 K/uL   RBC 5.01 4.40 - 5.90 MIL/uL   Hemoglobin 15.8 13.0 - 18.0 g/dL    Comment: RESULT REPEATED AND VERIFIED   HCT 44.3 40.0 - 52.0 %   MCV  88.4 80.0 - 100.0 fL   MCH 31.5 26.0 - 34.0 pg   MCHC 35.7 32.0 - 36.0 g/dL   RDW 12.8 11.5 - 14.5 %   Platelets 243 150 - 440 K/uL  Urine Drug Screen, Qualitative     Status: None   Collection Time: 06/23/16  8:57 AM  Result Value Ref Range   Tricyclic, Ur Screen NONE DETECTED NONE DETECTED   Amphetamines, Ur Screen NONE DETECTED NONE DETECTED   MDMA (Ecstasy)Ur Screen NONE DETECTED NONE DETECTED   Cocaine Metabolite,Ur [place] NONE DETECTED NONE DETECTED   Opiate, Ur Screen NONE DETECTED NONE DETECTED   Phencyclidine (PCP) Ur S NONE DETECTED NONE DETECTED   Cannabinoid 50 Ng, Ur [place] NONE DETECTED NONE DETECTED   Barbiturates, Ur Screen NONE DETECTED NONE DETECTED   Benzodiazepine, Ur Scrn NONE DETECTED NONE DETECTED   Methadone Scn, Ur NONE DETECTED NONE DETECTED    Comment: (NOTE) 160  Tricyclics, urine               Cutoff 1000 ng/mL 200  Amphetamines, urine             Cutoff 1000 ng/mL 300  MDMA (Ecstasy), urine           Cutoff 500 ng/mL 400  Cocaine Metabolite, urine       Cutoff 300 ng/mL 500  Opiate, urine                   Cutoff 300 ng/mL 600  Phencyclidine (PCP), urine      Cutoff 25 ng/mL 700  Cannabinoid, urine              Cutoff 50  ng/mL 800  Barbiturates, urine             Cutoff 200 ng/mL 900  Benzodiazepine, urine           Cutoff 200 ng/mL 1000 Methadone, urine                Cutoff 300 ng/mL 1100 1200 The urine drug screen provides only a preliminary, unconfirmed 1300 analytical test result and should not be used for non-medical 1400 purposes. Clinical consideration and professional judgment should 1500 be applied to any positive drug screen result due to possible 1600 interfering substances. A more specific alternate chemical method 1700 must be used in order to obtain a confirmed analytical result.  1800 Gas chromato graphy / mass spectrometry (GC/MS) is the preferred 1900 confirmatory method.     No current facility-administered medications for this encounter.    Current Outpatient Prescriptions  Medication Sig Dispense Refill  . ARIPiprazole 400 MG SUSR Inject 400 mg into the muscle every 28 (twenty-eight) days. 1 each 1  . hydrOXYzine (VISTARIL) 25 MG capsule Take 1 capsule (25 mg total) by mouth every 8 (eight) hours as needed for anxiety. 20 capsule 0  . ARIPiprazole (ABILIFY) 5 MG tablet Take 5 mg by mouth daily.      Musculoskeletal: Strength & Muscle Tone: within normal limits Gait & Station: normal Patient leans: N/A  Psychiatric Specialty Exam: Physical Exam  Constitutional: He appears well-developed and well-nourished.  HENT:  Head: Normocephalic and atraumatic.  Eyes: Conjunctivae are normal. Pupils are equal, round, and reactive to light.  Neck: Normal range of motion.  Cardiovascular: Regular rhythm and normal heart sounds.   Respiratory: Effort normal and breath sounds normal.  GI: Soft.  Musculoskeletal: Normal range of motion.  Neurological: He is alert.  Skin: Skin is warm  and dry.  Psychiatric: His affect is blunt. His speech is delayed. He is slowed. Cognition and memory are normal. He expresses impulsivity. He expresses suicidal ideation.    Review of Systems   Constitutional: Negative.   HENT: Negative.   Eyes: Negative.   Respiratory: Negative.   Cardiovascular: Negative.   Gastrointestinal: Negative.   Musculoskeletal: Negative.   Skin: Negative.   Neurological: Negative.   Psychiatric/Behavioral: Positive for hallucinations. Negative for depression, memory loss, substance abuse and suicidal ideas. The patient is nervous/anxious. The patient does not have insomnia.     Blood pressure (!) 110/57, pulse 79, temperature 98.3 F (36.8 C), temperature source Oral, resp. rate 18, height '6\' 2"'  (1.88 m), weight 94.3 kg (208 lb), SpO2 99 %.Body mass index is 26.71 kg/m.  General Appearance: Casual  Eye Contact:  Minimal  Speech:  Slow  Volume:  Decreased  Mood:  Euthymic  Affect:  Blunt  Thought Process:  Goal Directed  Orientation:  Full (Time, Place, and Person)  Thought Content:  Rumination  Suicidal Thoughts:  No  Homicidal Thoughts:  No  Memory:  Immediate;   Fair Recent;   Fair Remote;   Fair  Judgement:  Fair  Insight:  Fair  Psychomotor Activity:  Decreased  Concentration:  Concentration: Fair  Recall:  [employer] of Knowledge:  Fair  Language:  Fair  Akathisia:  No  Handed:  Right  AIMS (if indicated):     Assets:  Desire for Improvement Financial Resources/Insurance Housing Physical Health Resilience  ADL's:  Intact  Cognition:  WNL  Sleep:        Treatment Plan Summary: Plan 30 year old man with schizophrenia. No evidence of acute dangerousness. Back to baseline now. This is his recurrent presentation I suspect he is having something similar to panic attacks. We talked about relaxation techniques which is an idea he is familiar with. He is encouraged to follow-up with [facility] about possibly getting more therapy support around that issue. There is no indication for me to make any changes to medicine. He can be discharged back to his living situation.  Disposition: Patient does not meet criteria for psychiatric inpatient  admission.  Alethia Berthold, MD 06/23/2016 5:00 PM

## 2016-06-23 NOTE — Progress Notes (Signed)
Per Dr. Lucianne MussKumar, request contact with current provider [facility] to schedule soonest available appt. This counselor received pt. Consent and contacted [facility] pt. has [facility] on August 16th at 0920 [facility]  [place] Kirchner K. Sherlon HandingHarris, LCAS-A, LPC-A, [facility]  Counselor 06/23/2016 10:18 AM

## 2016-06-23 NOTE — ED Notes (Signed)
Pt given breakfast tray. Pt ate. Urine collected and sent to lab.

## 2016-06-23 NOTE — ED Provider Notes (Signed)
[facility] Emergency Department Provider Note  ____________________________________________  Time seen: Approximately 3:28 AM  I have reviewed the triage vital signs and the nursing notes.   HISTORY  Chief Complaint Medical Clearance   HPI Edwin RidgelCameron Montes is a 30 y.o. male with h/o schizophrenia and anxiety who presents for evaluation of racing thoughts and auditory hallucinations. Patient reports that he has been hearing whispering and is unable to determine what the voices are saying to him. He is also complaining of racing thoughts and has been unable to perform daily activities and focus. Patient reports that he was on a monthly shots of Abilify with the last one in the hand of July. He called EMS today because he was worried about his racing thoughts. He denies SI or HI.  Past Medical History:  Diagnosis Date  . Anxiety   . Schizophrenia [facility](HCC)     Patient Active Problem List   Diagnosis Date Noted  . Noncompliance 02/29/2016  . 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 Tobacco use disorder 01/27/2016    Past Surgical History:  Procedure Laterality Date  . NO PAST SURGERIES      Prior to Admission medications   Medication Sig Start Date End Date Taking? Authorizing Provider  hydrOXYzine (VISTARIL) 25 MG capsule Take 1 capsule (25 mg total) by mouth every 8 (eight) hours as needed for anxiety. 06/13/16  Yes Rebecka ApleyAllison P Webster, MD  ARIPiprazole (ABILIFY) 5 MG tablet Take 5 mg by mouth daily.    Historical Provider, MD  ARIPiprazole 400 MG SUSR Inject 400 mg into the muscle every 28 (twenty-eight) days. 03/02/16   Shari ProwsJolanta B Pucilowska, MD    Allergies Review of patient's allergies indicates no known allergies.  No family history on file.  Social History Social History  Substance Use Topics  . Smoking status: Current Some Day Smoker    Types: Cigarettes  . Smokeless tobacco: Never Used  . Alcohol use No    Review of  Systems  Constitutional: Negative for fever. Eyes: Negative for visual changes. ENT: Negative for sore throat. Cardiovascular: Negative for chest pain. Respiratory: Negative for shortness of breath. Gastrointestinal: Negative for abdominal pain, vomiting or diarrhea. Genitourinary: Negative for dysuria. Musculoskeletal: Negative for back pain. Skin: Negative for rash. Neurological: Negative for headaches, weakness or numbness. Psych: Racing thoughts and AH  ____________________________________________   PHYSICAL EXAM:  VITAL SIGNS: ED Triage Vitals  Enc Vitals Group     BP 06/23/16 0303 137/80     Pulse Rate 06/23/16 0303 76     Resp 06/23/16 0303 16     Temp 06/23/16 0303 97.8 F (36.6 C)     Temp Source 06/23/16 0303 Oral     SpO2 06/23/16 0303 100 %     Weight 06/23/16 0303 208 lb (94.3 kg)     Height 06/23/16 0303 6\' 2"  (1.88 m)     Head Circumference --      Peak Flow --      Pain Score 06/23/16 0304 0     Pain Loc --      Pain Edu? --      Excl. in GC? --     Constitutional: Alert and oriented. Well appearing and in no apparent distress. HEENT:      Head: Normocephalic and atraumatic.         Eyes: Conjunctivae are normal. Sclera is non-icteric. EOMI. PERRL      Mouth/Throat: Mucous membranes are moist.  Neck: Supple with no signs of meningismus. Cardiovascular: Regular rate and rhythm. No murmurs, gallops, or rubs. 2+ symmetrical distal pulses are present in all extremities. No JVD. Respiratory: Normal respiratory effort. Lungs are clear to auscultation bilaterally. No wheezes, crackles, or rhonchi.  Gastrointestinal: Soft, non tender, and non distended with positive bowel sounds. No rebound or guarding. Genitourinary: No CVA tenderness. Musculoskeletal: Nontender with normal range of motion in all extremities. No edema, cyanosis, or erythema of extremities. Neurologic: Normal speech and language. Face is symmetric. Moving all extremities. No gross focal  neurologic deficits are appreciated. Skin: Skin is warm, dry and intact. No rash noted. Psychiatric: Poor eye contact. Flat affect. Auditory hallucinations. Racing thoughts. No SI/HI.  ____________________________________________   LABS (all labs ordered are listed, but only abnormal results are displayed)  Labs Reviewed  COMPREHENSIVE METABOLIC PANEL  ETHANOL  SALICYLATE LEVEL  ACETAMINOPHEN LEVEL  CBC  URINE DRUG SCREEN, QUALITATIVE ([facility] ONLY)   ____________________________________________  EKG  none ____________________________________________  RADIOLOGY  none  ____________________________________________   PROCEDURES  Procedure(s) performed: None Procedures Critical Care performed:  None ____________________________________________   INITIAL IMPRESSION / ASSESSMENT AND PLAN / ED COURSE  30 y.o. male with h/o schizophrenia and anxiety who presents for evaluation of racing thoughts and auditory hallucinations. Patient here voluntarily with SI or HI. Will check labs for medical clearance and consult psychiatry.  Clinical Course    Pertinent labs & imaging results that were available during my care of the patient were reviewed by me and considered in my medical decision making (see chart for details).    ____________________________________________   FINAL CLINICAL IMPRESSION(S) / ED DIAGNOSES  Final diagnoses:  Auditory hallucinations      NEW MEDICATIONS STARTED DURING THIS VISIT:  New Prescriptions   No medications on file     Note:  This document was prepared using Dragon voice recognition software and may include unintentional dictation errors.    Nita Sickle, MD 06/23/16 [phone]

## 2016-06-23 NOTE — ED Notes (Signed)
Pt sleeping. Breakfast tray ordered.

## 2016-06-23 NOTE — ED Notes (Signed)
Patient asleep in room. No noted distress or abnormal behavior. Will continue 15 minute checks and observation by security cameras for safety. 

## 2016-06-23 NOTE — BH Assessment (Signed)
Assessment Note  Edwin Montes is an 30 y.o. male , with a history of schizophrenia, presenting to the ER via EMS due to having racing thoughts. Patient was seen in the ED on 06/13/16 for the same. He reports the "racing thoughts" are not keeping him from sleep, nor is it interfering with his quality of life.  He states that he has developed coping skills to help him get through the negative thoughts. Patient denies SI/HI and V/H. He endorses A/H.   Patient is currently an open client with [facility] and is under the psychiatric care of Dr. Georjean ModeLitz. He states his next appointment is 07/09/2016.  He reports he takes his medications as prescribed and feels that his meds are working for him.  Diagnosis: Anxiety  Past Medical History:  Past Medical History:  Diagnosis Date  . Anxiety   . Schizophrenia [facility](HCC)     Past Surgical History:  Procedure Laterality Date  . NO PAST SURGERIES      Family History: No family history on file.  Social History:  reports that he has been smoking Cigarettes.  He has never used smokeless tobacco. He reports that he does not drink alcohol or use drugs.  Additional Social History:  Alcohol / Drug Use History of alcohol / drug use?: No history of alcohol / drug abuse  CIWA: CIWA-Ar BP: 137/80 Pulse Rate: 76 COWS:    Allergies: No Known Allergies  Home Medications:  (Not in a hospital admission)  OB/GYN Status:  No LMP for male patient.  General Assessment Data [facility] Assessment: In system Is this a Tele or Face-to-Face Assessment?: Face-to-Face Is this an Initial Assessment or a Re-assessment for this encounter?: Initial Assessment Marital status: Single Maiden name: n/a Is patient pregnant?: No Pregnancy Status: No Living Arrangements: Alone Can pt return to current living arrangement?: Yes Admission Status: Voluntary Is patient capable of signing voluntary admission?: Yes Referral Source: Self/Family/Friend Insurance  type: Medicare  Medical Screening Exam [facility] Walk-in ONLY) Medical Exam completed: Yes  Crisis Care Plan Living Arrangements: Alone Legal Guardian: Other: (self) Name of Psychiatrist: Dr. Georjean ModeLitz Name of Therapist: [facility]   Education Status Is patient currently in school?: No Current Grade: n/a Highest grade of school patient has completed: [employer] Name of school: [employer] Contact person: n/a  Risk to self with the past 6 months Suicidal Ideation: No Has patient been a risk to self within the past 6 months prior to admission? : No Suicidal Intent: No Has patient had any suicidal intent within the past 6 months prior to admission? : No Is patient at risk for suicide?: No Suicidal Plan?: No Has patient had any suicidal plan within the past 6 months prior to admission? : No Access to Means: No What has been your use of drugs/alcohol within the last 12 months?: Pt denies drug/alcohol use Previous Attempts/Gestures: No How many times?: 0 Other Self Harm Risks: None identified Triggers for Past Attempts: None known Intentional Self Injurious Behavior: None Family Suicide History: No Recent stressful life event(s): Other (Comment) (None reported) Persecutory voices/beliefs?: No Depression: No Substance abuse history and/or treatment for substance abuse?: No Suicide prevention information given to non-admitted patients: Not applicable  Risk to Others within the past 6 months Homicidal Ideation: No Does patient have any lifetime risk of violence toward others beyond the six months prior to admission? : No Thoughts of Harm to Others: No Current Homicidal Intent: No Current Homicidal Plan: No Access  to Homicidal Means: No Identified Victim: None identified History of harm to others?: No Assessment of Violence: None Noted Violent Behavior Description: None identified Does patient have access to weapons?: No Criminal Charges Pending?: No Does patient  have a court date: No Is patient on probation?: No  Psychosis Hallucinations: Auditory Delusions: None noted  Mental Status Report Appearance/Hygiene: Unremarkable Eye Contact: Fair Motor Activity: Freedom of movement Speech: Logical/coherent, Unremarkable Level of Consciousness: Drowsy Mood: Preoccupied Affect: Appropriate to circumstance Anxiety Level: Minimal Thought Processes: Relevant Judgement: Unimpaired Orientation: Person, Place, Time, Situation  Cognitive Functioning Concentration: Normal Memory: Recent Intact, Remote Intact IQ: Average Insight: Fair Impulse Control: Fair Appetite: Good Weight Loss: 0 Weight Gain: 0 Sleep: No Change Total Hours of Sleep: 8 Vegetative Symptoms: None  ADLScreening [facility] Assessment Services) Patient's cognitive ability adequate to safely complete daily activities?: Yes Patient able to express need for assistance with ADLs?: Yes Independently performs ADLs?: Yes (appropriate for developmental age)  Prior Inpatient Therapy Prior Inpatient Therapy: Yes Prior Therapy Dates: 2017, 2014, 2012 , 2009 Prior Therapy Facilty/Provider(s): [facility] [place], [facility], [facility] Reason for Treatment: Schizophrenia, depression  Prior Outpatient Therapy Prior Outpatient Therapy: Yes Prior Therapy Dates: current Prior Therapy Facilty/Provider(s): [facility] Reason for Treatment: Depression, Schizophrenia Does patient have an ACCT team?: No Does patient have Intensive In-House Services?  : No Does patient have [facility] services? : No Does patient have P4CC services?: No  ADL Screening (condition at time of admission) Patient's cognitive ability adequate to safely complete daily activities?: Yes Patient able to express need for assistance with ADLs?: Yes Independently performs ADLs?: Yes (appropriate for developmental age)       Abuse/Neglect Assessment (Assessment to be complete while patient is alone) Physical Abuse:  Denies Verbal Abuse: Denies Sexual Abuse: Denies Exploitation of patient/patient's resources: Denies Self-Neglect: Denies Values / Beliefs Cultural Requests During Hospitalization: None Spiritual Requests During Hospitalization: None Consults Spiritual Care Consult Needed: No Social Work Consult Needed: No Merchant navy officer (For Healthcare) Does patient have an advance directive?: No Would patient like information on creating an advanced directive?: No - patient declined information    Additional Information 1:1 In Past 12 Months?: No CIRT Risk: No Elopement Risk: No Does patient have medical clearance?: Yes     Disposition:  Disposition Initial Assessment Completed for this Encounter: Yes Disposition of Patient: Other dispositions Other disposition(s): Other (Comment) (Pending Psych MD consult)  On Site Evaluation by:   Reviewed with Physician:    [place] 06/23/2016 4:17 AM

## 2016-06-23 NOTE — ED Notes (Addendum)
Patient denies SI/HI/AVH and pain. All belongings returned to patient. Discharge instructions reviewed and follow up appointments. Patient discharged to home via taxi.

## 2016-06-23 NOTE — ED Notes (Signed)
Pt unable to urinate at this time.  AS

## 2016-06-23 NOTE — Discharge Instructions (Signed)
Please return immediately if condition worsens. Please contact her primary physician or the physician you were given for referral. If you have any specialist physicians involved in her treatment and plan please also contact them. Thank you for using [facility]. ° °

## 2016-06-23 NOTE — ED Notes (Signed)
ENVIRONMENTAL ASSESSMENT Potentially harmful objects out of patient reach: Yes Personal belongings secured: Yes Patient dressed in hospital provided attire only: Yes Plastic bags out of patient reach: Yes Patient care equipment (cords, cables, call bells, lines, and drains) shortened, removed, or accounted for: Yes Equipment and supplies removed from bottom of stretcher: Yes Potentially toxic materials out of patient reach: Yes Sharps container removed or out of patient reach: Yes  Patient is in room resting at this time. No signs of acute distress noted. Maintained on 15 minute checks and observation by security camera for safety.

## 2016-06-23 NOTE — ED Notes (Signed)
Patient currently in room with psychiatrist. No signs of distress noted at this time. Maintained on 15 minute checks and observation by security camera for safety.

## 2016-06-23 NOTE — ED Notes (Signed)
Report received from Ashley RN.

## 2016-06-23 NOTE — ED Triage Notes (Signed)
Pt bib EMS from home w/ c/o racing thoughts.  Pt denies SI/HI and drug/ETHO use.  Pt sts that he has been having auditory hallucinations, sts that he has been hearing whisper (unable to make out out what "whispers" are saying).  Pt also c/o racing thoughts and an inability to focus on any task.  Pt A/O x 4, resp even and unlabored, skin tone even WNL. NAD.

## 2016-06-23 NOTE — ED Notes (Signed)
Patient came to the ED after experiencing racing thoughts and auditory hallucinations. Patient currently denies SI/HI/pain, and states that he has auditory hallucinations of whispers that he can't understand. Patient denies feelings of depression and anxiety. Patient is calm and cooperative. No signs of acute distress noted. Maintained on 15 minute checks and observation by security camera for safety.

## 2016-06-23 NOTE — ED Notes (Signed)
Report given to Jenna RN

## 2016-07-03 ENCOUNTER — Encounter: Payer: Self-pay | Admitting: Emergency Medicine

## 2016-07-03 ENCOUNTER — Emergency Department
Admission: EM | Admit: 2016-07-03 | Discharge: 2016-07-04 | Disposition: A | Discharge status: Home / Self Care | Payer: Medicare Other | Attending: Emergency Medicine | Admitting: Emergency Medicine

## 2016-07-03 DIAGNOSIS — Z79899 Other long term (current) drug therapy: Secondary | ICD-10-CM | POA: Insufficient documentation

## 2016-07-03 DIAGNOSIS — F17210 Nicotine dependence, cigarettes, uncomplicated: Secondary | ICD-10-CM | POA: Insufficient documentation

## 2016-07-03 DIAGNOSIS — F429 Obsessive-compulsive disorder, unspecified: Secondary | ICD-10-CM | POA: Diagnosis not present

## 2016-07-03 DIAGNOSIS — F4320 Adjustment disorder, unspecified: Secondary | ICD-10-CM | POA: Diagnosis not present

## 2016-07-03 DIAGNOSIS — F99 Mental disorder, not otherwise specified: Secondary | ICD-10-CM | POA: Diagnosis present

## 2016-07-03 DIAGNOSIS — F428 Other obsessive-compulsive disorder: Secondary | ICD-10-CM

## 2016-07-03 LAB — CBC
HCT: 47.9 % (ref 40.0–52.0)
Hemoglobin: 16.7 g/dL (ref 13.0–18.0)
MCH: 30.9 pg (ref 26.0–34.0)
MCHC: 34.9 g/dL (ref 32.0–36.0)
MCV: 88.6 fL (ref 80.0–100.0)
PLATELETS: 275 10*3/uL (ref 150–440)
RBC: 5.41 MIL/uL (ref 4.40–5.90)
RDW: 13.1 % (ref 11.5–14.5)
WBC: 7.9 10*3/uL (ref 3.8–10.6)

## 2016-07-03 LAB — COMPREHENSIVE METABOLIC PANEL
ALBUMIN: 4.9 g/dL (ref 3.5–5.0)
ALT: 21 U/L (ref 17–63)
ANION GAP: 11 (ref 5–15)
AST: 28 U/L (ref 15–41)
Alkaline Phosphatase: 64 U/L (ref 38–126)
BUN: 23 mg/dL — ABNORMAL HIGH (ref 6–20)
CALCIUM: 9.6 mg/dL (ref 8.9–10.3)
CO2: 25 mmol/L (ref 22–32)
Chloride: 105 mmol/L (ref 101–111)
Creatinine, Ser: 1.03 mg/dL (ref 0.61–1.24)
GFR calc non Af Amer: >60 mL/min (ref >60)
GLUCOSE: 137 mg/dL — AB (ref 65–99)
POTASSIUM: 4.1 mmol/L (ref 3.5–5.1)
SODIUM: 141 mmol/L (ref 135–145)
TOTAL PROTEIN: 7.6 g/dL (ref 6.5–8.1)
Total Bilirubin: 0.7 mg/dL (ref 0.3–1.2)

## 2016-07-03 LAB — ACETAMINOPHEN LEVEL

## 2016-07-03 LAB — SALICYLATE LEVEL

## 2016-07-03 LAB — ETHANOL

## 2016-07-03 NOTE — ED Triage Notes (Addendum)
Patient ambulatory to triage with steady gait, without difficulty, pt with no eye contact, flat affect, brought in by EMS for anxiety; st does not have any PRN meds for such; pt reports seen here recently for for behavioral med eval and would like to be evaluated again; admits to visual hallucinations

## 2016-07-04 DIAGNOSIS — F4320 Adjustment disorder, unspecified: Secondary | ICD-10-CM | POA: Diagnosis not present

## 2016-07-04 LAB — URINE DRUG SCREEN, QUALITATIVE (ARMC ONLY)
AMPHETAMINES, UR SCREEN: NOT DETECTED
BARBITURATES, UR SCREEN: NOT DETECTED
BENZODIAZEPINE, UR SCRN: NOT DETECTED
Cannabinoid 50 Ng, Ur ~~LOC~~: POSITIVE — AB
Cocaine Metabolite,Ur ~~LOC~~: NOT DETECTED
MDMA (Ecstasy)Ur Screen: NOT DETECTED
METHADONE SCREEN, URINE: NOT DETECTED
OPIATE, UR SCREEN: NOT DETECTED
Phencyclidine (PCP) Ur S: NOT DETECTED
TRICYCLIC, UR SCREEN: NOT DETECTED

## 2016-07-04 NOTE — ED Notes (Addendum)
Woke pt up to discharge him, he stated he was going to check in to in-patient downstairs. Pt informed he is being discharged. Pt stated he would call a taxi for ride home. Pt calling at this moment.

## 2016-07-04 NOTE — ED Notes (Signed)
Pt escorted to ED lobby where pt walked outside to cab he had called. Pt took D/C papers with him.

## 2016-07-04 NOTE — ED Notes (Signed)
Went over D/C papers with pt. He had no further questions. D/C papers given to pt, pt awaiting taxi ride.

## 2016-07-04 NOTE — ED Notes (Signed)
SOC complete.  

## 2016-07-04 NOTE — ED Notes (Signed)
SOC recommendations:  1. Follow up with [facility]  2. Continue current medication regimen.

## 2016-07-04 NOTE — ED Provider Notes (Signed)
[facility] Emergency Department Provider Note   ____________________________________________   First MD Initiated Contact with Patient 07/04/16 2351     (approximate)  I have reviewed the triage vital signs and the nursing notes.   HISTORY  Chief Complaint Mental Health Problem    HPI Edwin Montes is a 30 y.o. male comes into the hospital today with racing thoughts. The patient reports that they started earlier today. He reports that he has been taking about everything. He denies any suicidal or homicidal thoughts. He reports that he does not have this often but he has had it before. The patient denies drinking or doing any drugs. He has been sleeping well. He denies any auditory or visual hallucinations. The patient denies any headache, blurred vision, shortness of breath, chest pain. He gets Abilify injections he reports monthly and he had the last one a couple of weeks ago. The patient also has a psychiatrist who he sees that he saw last Thursday. The patient was concerned so he decided to come into the hospital for evaluation.   Past Medical History:  Diagnosis Date  . Anxiety   . Schizophrenia [facility](HCC)     Patient Active Problem List   Diagnosis Date Noted  . Panic attack 06/23/2016  . Noncompliance 02/29/2016  . 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 Tobacco use disorder 01/27/2016    Past Surgical History:  Procedure Laterality Date  . NO PAST SURGERIES      Prior to Admission medications   Medication Sig Start Date End Date Taking? Authorizing Provider  ARIPiprazole (ABILIFY) 5 MG tablet Take 5 mg by mouth daily.    Historical Provider, MD  ARIPiprazole 400 MG SUSR Inject 400 mg into the muscle every 28 (twenty-eight) days. 03/02/16   Shari ProwsJolanta B Pucilowska, MD  hydrOXYzine (VISTARIL) 25 MG capsule Take 1 capsule (25 mg total) by mouth every 8 (eight) hours as needed for anxiety. 06/13/16   Rebecka ApleyAllison P Diante Barley, MD     Allergies Review of patient's allergies indicates no known allergies.  No family history on file.  Social History Social History  Substance Use Topics  . Smoking status: Current Some Day Smoker    Types: Cigarettes  . Smokeless tobacco: Never Used  . Alcohol use No    Review of Systems Constitutional: No fever/chills Eyes: No visual changes. ENT: No sore throat. Cardiovascular: Denies chest pain. Respiratory: Denies shortness of breath. Gastrointestinal: No abdominal pain.  No nausea, no vomiting.  No diarrhea.  No constipation. Genitourinary: Negative for dysuria. Musculoskeletal: Negative for back pain. Skin: Negative for rash. Neurological: Negative for headaches, focal weakness or numbness. Psych: Racing thoughts  10-point ROS otherwise negative.  ____________________________________________   PHYSICAL EXAM:  VITAL SIGNS: ED Triage Vitals  Enc Vitals Group     BP 07/03/16 2202 138/76     Pulse Rate 07/03/16 2202 81     Resp 07/03/16 2202 18     Temp 07/03/16 2202 98 F (36.7 C)     Temp Source 07/03/16 2202 Oral     SpO2 07/03/16 2202 99 %     Weight 07/03/16 2201 209 lb (94.8 kg)     Height 07/03/16 2201 6\' 2"  (1.88 m)     Head Circumference --      Peak Flow --      Pain Score --      Pain Loc --      Pain Edu? --      Excl. in GC? --  Constitutional: Alert and oriented. Well appearing and in Mild distress. Eyes: Conjunctivae are normal. PERRL. EOMI. Head: Atraumatic. Nose: No congestion/rhinnorhea. Mouth/Throat: Mucous membranes are moist.  Oropharynx non-erythematous. ardiovascular: Normal rate, regular rhythm. Grossly normal heart sounds.  Good peripheral circulation. Respiratory: Normal respiratory effort.  No retractions. Lungs CTAB. Gastrointestinal: Soft and nontender. No distention. Positive bowel sounds Musculoskeletal: No lower extremity tenderness nor edema.  . Neurologic:  Normal speech and language.  Skin:  Skin is warm, dry  and intact.  Psychiatric: Mood and affect are normal. .  ____________________________________________   LABS (all labs ordered are listed, but only abnormal results are displayed)  Labs Reviewed  COMPREHENSIVE METABOLIC PANEL - Abnormal; Notable for the following:       Result Value   Glucose, Bld 137 (*)    BUN 23 (*)    All other components within normal limits  ACETAMINOPHEN LEVEL - Abnormal; Notable for the following:    Acetaminophen (Tylenol), Serum <10 (*)    All other components within normal limits  URINE DRUG SCREEN, QUALITATIVE ([facility] ONLY) - Abnormal; Notable for the following:    Cannabinoid 50 Ng, Ur [place] POSITIVE (*)    All other components within normal limits  ETHANOL  SALICYLATE LEVEL  CBC   ____________________________________________  EKG  None ____________________________________________  RADIOLOGY  None ____________________________________________   PROCEDURES  Procedure(s) performed: None  Procedures  Critical Care performed: No  ____________________________________________   INITIAL IMPRESSION / ASSESSMENT AND PLAN / ED COURSE  Pertinent labs & imaging results that were available during my care of the patient were reviewed by me and considered in my medical decision making (see chart for details).  This is a 30 year old male who comes into the hospital today with racing thoughts. The patient does have a history of suicidal ideation. I'll have the patient evaluated by TTS and then decide if the patient should be seen by the specialist on-call or stay and be seen by psych.  Clinical Course   The patient was seen by the specialist on-call and he felt that the patient could be discharged home with some support and follow-up with [facility]. Otherwise the patient has no other complaints. He is still not suicidal. He'll be discharged to home.  ____________________________________________   FINAL CLINICAL IMPRESSION(S) / ED DIAGNOSES  Final  diagnoses:  Emotional crisis  Obsessional thoughts      NEW MEDICATIONS STARTED DURING THIS VISIT:  New Prescriptions   No medications on file     Note:  This document was prepared using Dragon voice recognition software and may include unintentional dictation errors.    Rebecka ApleyAllison P Corda Shutt, MD 07/04/16 [phone]

## 2016-07-04 NOTE — ED Notes (Signed)
Pt to room 2 to conduct SOC.

## 2016-07-11 ENCOUNTER — Emergency Department
Admission: EM | Admit: 2016-07-11 | Discharge: 2016-07-12 | Disposition: A | Discharge status: Home / Self Care | Payer: Medicare Other | Attending: Emergency Medicine | Admitting: Emergency Medicine

## 2016-07-11 ENCOUNTER — Encounter: Payer: Self-pay | Admitting: Emergency Medicine

## 2016-07-11 DIAGNOSIS — F203 Undifferentiated schizophrenia: Secondary | ICD-10-CM | POA: Diagnosis not present

## 2016-07-11 DIAGNOSIS — Z008 Encounter for other general examination: Secondary | ICD-10-CM | POA: Diagnosis present

## 2016-07-11 DIAGNOSIS — F17210 Nicotine dependence, cigarettes, uncomplicated: Secondary | ICD-10-CM | POA: Diagnosis not present

## 2016-07-11 LAB — COMPREHENSIVE METABOLIC PANEL
ALT: 14 U/L — ABNORMAL LOW (ref 17–63)
ANION GAP: 6 (ref 5–15)
AST: 24 U/L (ref 15–41)
Albumin: 4.7 g/dL (ref 3.5–5.0)
Alkaline Phosphatase: 61 U/L (ref 38–126)
BUN: 14 mg/dL (ref 6–20)
CHLORIDE: 106 mmol/L (ref 101–111)
CO2: 27 mmol/L (ref 22–32)
CREATININE: 0.84 mg/dL (ref 0.61–1.24)
Calcium: 9.3 mg/dL (ref 8.9–10.3)
Glucose, Bld: 90 mg/dL (ref 65–99)
POTASSIUM: 4 mmol/L (ref 3.5–5.1)
Sodium: 139 mmol/L (ref 135–145)
Total Bilirubin: 0.3 mg/dL (ref 0.3–1.2)
Total Protein: 7.2 g/dL (ref 6.5–8.1)

## 2016-07-11 LAB — CBC
HEMATOCRIT: 44.5 % (ref 40.0–52.0)
HEMOGLOBIN: 15.5 g/dL (ref 13.0–18.0)
MCH: 31.1 pg (ref 26.0–34.0)
MCHC: 34.8 g/dL (ref 32.0–36.0)
MCV: 89.3 fL (ref 80.0–100.0)
Platelets: 259 10*3/uL (ref 150–440)
RBC: 4.99 MIL/uL (ref 4.40–5.90)
RDW: 12.6 % (ref 11.5–14.5)
WBC: 7.3 10*3/uL (ref 3.8–10.6)

## 2016-07-11 LAB — SALICYLATE LEVEL

## 2016-07-11 LAB — ETHANOL

## 2016-07-11 LAB — ACETAMINOPHEN LEVEL: Acetaminophen (Tylenol), Serum: <10 ug/mL — ABNORMAL LOW (ref 10–30)

## 2016-07-11 NOTE — ED Notes (Signed)
ED BHU PLACEMENT JUSTIFICATION Is the patient under IVC or is there intent for IVC: No. Is the patient medically cleared: Yes.   Is there vacancy in the ED BHU: Yes.   Is the population mix appropriate for patient: Yes.   Is the patient awaiting placement in inpatient or outpatient setting: No. Has the patient had a psychiatric consult: No. Survey of unit performed for contraband, proper placement and condition of furniture, tampering with fixtures in bathroom, shower, and each patient room: Yes.  ; Findings: NA APPEARANCE/BEHAVIOR calm, cooperative and adequate rapport can be established NEURO ASSESSMENT Orientation: time, place and person Hallucinations: No.None noted (Hallucinations) Speech: Normal Gait: normal RESPIRATORY ASSESSMENT Normal expansion.  Clear to auscultation.  No rales, rhonchi, or wheezing. CARDIOVASCULAR ASSESSMENT regular rate and rhythm, S1, S2 normal, no murmur, click, rub or gallop GASTROINTESTINAL ASSESSMENT soft, nontender, BS WNL, no r/g EXTREMITIES normal strength, tone, and muscle mass PLAN OF CARE Provide calm/safe environment. Vital signs assessed twice daily. ED BHU Assessment once each 12-hour shift. Collaborate with intake RN daily or as condition indicates. Assure the ED provider has rounded once each shift. Provide and encourage hygiene. Provide redirection as needed. Assess for escalating behavior; address immediately and inform ED provider.  Assess family dynamic and appropriateness for visitation as needed: Yes.  ; If necessary, describe findings: NA Educate the patient/family about BHU procedures/visitation: Yes.  ; If necessary, describe findings: NA  

## 2016-07-11 NOTE — BH Assessment (Signed)
Assessment Note  Edwin Montes is an 30 y.o. male presenting to the ED with concerns of racing thoughts and anxiety.  He denied having auditory or visual hallucinations. He denied suicidal or homicidal ideation or intent.  He denied the use of alcohol or drugs. He denied facing any additional stressors at this time. He denied a trigger for his racing thoughts.  He states that the racing thoughts have been occurring for "the last few years". Pt was seen in the ED 2 weeks ago for the same.  Diagnosis: Anxiety  Past Medical History:  Past Medical History:  Diagnosis Date  . Anxiety   . Schizophrenia [facility])     Past Surgical History:  Procedure Laterality Date  . NO PAST SURGERIES      Family History: No family history on file.  Social History:  reports that he has been smoking Cigarettes.  He has never used smokeless tobacco. He reports that he does not drink alcohol or use drugs.  Additional Social History:  Alcohol / Drug Use History of alcohol / drug use?: No history of alcohol / drug abuse  CIWA: CIWA-Ar BP: 120/61 Pulse Rate: 78 COWS:    Allergies: No Known Allergies  Home Medications:  (Not in a hospital admission)  OB/GYN Status:  No LMP for male patient.  General Assessment Data Location of Assessment: [facility] ED TTS Assessment: In system Is this a Tele or Face-to-Face Assessment?: Face-to-Face Is this an Initial Assessment or a Re-assessment for this encounter?: Initial Assessment Marital status: Single Maiden name: n/a Is patient pregnant?: No Pregnancy Status: No Living Arrangements: Alone Can pt return to current living arrangement?: Yes Admission Status: Voluntary Is patient capable of signing voluntary admission?: Yes Referral Source: Self/Family/Friend Insurance type: Medicare  Medical Screening Exam [facility] Walk-in ONLY) Medical Exam completed: Yes  Crisis Care Plan Living Arrangements: Alone Legal Guardian: Other: (Self) Name of Psychiatrist: Dr.  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 : No Suicidal Intent: No Has patient had any suicidal intent within the past 6 months prior to admission? : No Is patient at risk for suicide?: No Suicidal Plan?: No Has patient had any suicidal plan within the past 6 months prior to admission? : No Access to Means: No What has been your use of drugs/alcohol within the last 12 months?: Reports of none Previous Attempts/Gestures: No How many times?: 0 Other Self Harm Risks: Reports of none Triggers for Past Attempts: None known Intentional Self Injurious Behavior: None Family Suicide History: No Recent stressful life event(s): Other (Comment) Persecutory voices/beliefs?: No Depression: Yes Depression Symptoms: Feeling worthless/self pity Substance abuse history and/or treatment for substance abuse?: No Suicide prevention information given to non-admitted patients: Not applicable  Risk to Others within the past 6 months Homicidal Ideation: No Does patient have any lifetime risk of violence toward others beyond the six months prior to admission? : No Thoughts of Harm to Others: No Current Homicidal Intent: No Current Homicidal Plan: No Access to Homicidal Means: No Identified Victim: None identified History of harm to others?: No Assessment of Violence: None Noted Violent Behavior Description: None identified Does patient have access to weapons?: No Criminal Charges Pending?:  No Does patient have a court date: No Is patient on probation?: No  Psychosis Hallucinations: None noted Delusions: None noted  Mental Status Report Appearance/Hygiene: In  scrubs Eye Contact: Fair Motor Activity: Freedom of movement, Unremarkable Speech: Logical/coherent, Unremarkable Level of Consciousness: Drowsy Mood: Helpless, Despair Affect: Appropriate to circumstance Anxiety Level: Minimal Thought Processes: Coherent, Relevant Judgement: Partial Orientation: Person, Place, Time, Situation Obsessive Compulsive Thoughts/Behaviors: Minimal  Cognitive Functioning Concentration: Normal Memory: Recent Intact, Remote Intact IQ: Average Insight: Fair Impulse Control: Fair Appetite: Good Weight Loss: 0 Weight Gain: 0 Sleep: No Change Total Hours of Sleep: 8 Vegetative Symptoms: None  ADLScreening [facility] Assessment Services) Patient's cognitive ability adequate to safely complete daily activities?: Yes Patient able to express need for assistance with ADLs?: Yes Independently performs ADLs?: Yes (appropriate for developmental age)  Prior Inpatient Therapy Prior Inpatient Therapy: Yes Prior Therapy Dates: 2017, 2014, 2012 , 2009 [facility] [place] [place] Reason for Treatment: Schizophrenia, depression  Prior Outpatient Therapy Prior Outpatient Therapy: Yes Prior Therapy Dates: current Prior Therapy Facilty/Provider(s): [facility] Reason for Treatment: Depression, Schizophrenia Does patient have an ACCT team?: No Does patient have Intensive In-House Services?  : No Does patient have [facility] services? : No Does patient have P4CC services?: No  ADL Screening (condition at time of admission) Patient's cognitive ability adequate to safely complete daily activities?: Yes Patient able to express need for assistance with ADLs?: Yes Independently performs ADLs?: Yes (appropriate for developmental age)       Abuse/Neglect Assessment (Assessment to be complete while patient is alone) Physical Abuse: Denies Verbal Abuse: Denies Sexual Abuse: Denies Exploitation of patient/patient's  resources: Denies Self-Neglect: Denies Values / Beliefs Cultural Requests During Hospitalization: None Spiritual Requests During Hospitalization: None Consults Spiritual Care Consult Needed: No Social Work Consult Needed: No Merchant navy officerAdvance Directives (For Healthcare) Does patient have an advance directive?: No Would patient like information on creating an advanced directive?: No - patient declined information Does patient want to make changes to advanced directive?: No - Patient declined Copy of advanced directive(s) in chart?: No - copy requested    Additional Information 1:1 In Past 12 Months?: No CIRT Risk: No Elopement Risk: No Does patient have medical clearance?: Yes     Disposition:  Disposition Initial Assessment Completed for this Encounter: Yes Disposition of Patient: Other dispositions Other disposition(s): Other (Comment) (Pending Psych MD consult)  On Site Evaluation by:   Reviewed with Physician:    Artist Beachoxana C Angelee Bahr 07/11/2016 9:32 PM

## 2016-07-11 NOTE — ED Notes (Signed)
Pt. To BHU from ED ambulatory without difficulty, to room  [facility] 1. [facility]. Pt. Is alert and oriented, warm and dry in no distress. Pt. Denies SI, HI, and AVH. Pt. Calm and cooperative. Pt states he has been having racing thoughts. Pt. Made aware of security cameras and Q15 minute rounds. Pt. Encouraged to let Nursing staff know of any concerns or needs.

## 2016-07-11 NOTE — ED Triage Notes (Signed)
Patient ambulatory to triage with steady gait, without difficulty or distress noted, brought in by EMS; pt here recently for same; c/o racing thought and not taking meds

## 2016-07-11 NOTE — ED Notes (Signed)

## 2016-07-12 DIAGNOSIS — F203 Undifferentiated schizophrenia: Secondary | ICD-10-CM | POA: Diagnosis not present

## 2016-07-12 NOTE — ED Notes (Signed)
Patient resting quietly in room. No noted distress or abnormal behaviors noted. Will continue 15 minute checks and observation by security camera for safety. 

## 2016-07-12 NOTE — ED Provider Notes (Signed)
-----------------------------------------   6:58 AM on 07/12/2016 -----------------------------------------   Blood pressure (!) 106/56, pulse 85, temperature 98 F (36.7 C), temperature source Oral, resp. rate 16, height 6\' 2"  (1.88 m), weight 94.8 kg, SpO2 98 %.  The patient had no acute events since last update.  Calm and cooperative at this time.  Disposition is pending Psychiatry/Behavioral Medicine team recommendations.     Loleta Roseory Dakoda Laventure, MD 07/12/16 [phone]

## 2016-07-12 NOTE — ED Notes (Signed)
VOL/Consult completed/Pending return to group home

## 2016-07-12 NOTE — Consult Note (Signed)
[facility] Psychiatry Consult   Reason for Consult:  Consult for 30 year old man with schizophrenia who came to the emergency room stating he was having "racing thoughts" Referring Physician:  Karma Greaser Patient Identification: Edwin Montes MRN:  [id number] Principal Diagnosis: Undifferentiated schizophrenia [facility]) Diagnosis:   Patient Active Problem List   Diagnosis Date Noted  . Panic attack [F41.0] 06/23/2016  . Noncompliance [Z91.19] 02/29/2016  . Undifferentiated schizophrenia ([place]) [F20.3] 01/27/2016  . Tobacco use disorder [F17.200] 01/27/2016    Total Time spent with patient: 1 hour  Subjective:   Edwin Montes is a 30 y.o. male patient admitted with "I wishes to having racing thoughts".  HPI:  Patient interviewed. Chart reviewed. Labs reviewed. 30 year old man says that yesterday he was going about his usual business during the day when he started to have "racing thoughts". He has a hard time as usual describing that. He says he feels like he can't relax his body. He denies that it felt like akathisia. He denied that it was particularly painful or uncomfortable. He denied that he was having any suicidal thoughts and denied any hallucinations. He says he just felt like something was telling him that he ought to come and check into the hospital. He has been calm and cooperative since being here in the emergency room. He is not reporting any acute symptoms right now. He says he has been compliant with his medicine. He denied that he's been drinking or abusing any drugs although he does have marijuana in a drug screen from last week. Denies knowing of any recent stressors.  Social history: Lives at a group home. Likes it okay. Goes to a day program part of the time. Yesterday he had skipped the day program and went to the [facility] to play basketball. Denies any new social problems.  Medical history: Denies any significant ongoing medical problems  Substance abuse history: Occasional use  of marijuana although he denies that he uses drugs regularly no past known history of substance abuse issues.  Past Psychiatric History: History of schizophrenia. Currently on long-acting injectable medicine. He does have previous hospitalizations. He denies any history of suicide attempts or violence. He has had several presentations to the emergency room always with this same complaint and it always seems to clear up without any particular intervention.  Risk to Self: Suicidal Ideation: No Suicidal Intent: No Is patient at risk for suicide?: No Suicidal Plan?: No Access to Means: No What has been your use of drugs/alcohol within the last 12 months?: Reports of none How many times?: 0 Other Self Harm Risks: Reports of none Triggers for Past Attempts: None known Intentional Self Injurious Behavior: None Risk to Others: Homicidal Ideation: No Thoughts of Harm to Others: No Current Homicidal Intent: No Current Homicidal Plan: No Access to Homicidal Means: No Identified Victim: None identified History of harm to others?: No Assessment of Violence: None Noted Violent Behavior Description: None identified Does patient have access to weapons?: No Criminal Charges Pending?: No Does patient have a court date: No Prior Inpatient Therapy: Prior Inpatient Therapy: Yes Prior Therapy Dates: 2017, 2014, 2012 , 2009 Prior [facility] Facilty[facility]Provider(s[facility] [place], [facility], [facility] Reason for Treatment: Schizophrenia, depression Prior Outpatient Therapy: Prior Outpatient Therapy: Yes Prior Therapy Dates: current Prior Therapy Facilty/Provider(s): [facility] Reason for Treatment: Depression, Schizophrenia Does patient have an ACCT team?: No Does patient have Intensive In-House Services?  : No Does patient have [facility] services? : No Does patient have P4CC services?: No  Past Medical  History:  Past Medical History:  Diagnosis Date  . Anxiety   . Schizophrenia [facility])     Past  Surgical History:  Procedure Laterality Date  . NO PAST SURGERIES     Family History: No family history on file. Family Psychiatric  History: Denies knowing of any family history Social History:  History  Alcohol Use No     History  Drug Use No    Social History   Social History  . Marital status: Single    Spouse name: N/A  . Number of children: N/A  . Years of education: N/A   Social History Main Topics  . Smoking status: Current Some Day Smoker    Types: Cigarettes  . Smokeless tobacco: Never Used  . Alcohol use No  . Drug use: No  . Sexual activity: No   Other Topics Concern  . None   Social History Narrative  . None   Additional Social History:    Allergies:  No Known Allergies  Labs:  Results for orders placed or performed during the hospital encounter of 07/11/16 (from the past 48 hour(s))  Comprehensive metabolic panel     Status: Abnormal   Collection Time: 07/11/16  7:13 PM  Result Value Ref Range   Sodium 139 135 - 145 mmol/L   Potassium 4.0 3.5 - 5.1 mmol/L   Chloride 106 101 - 111 mmol/L   CO2 27 22 - 32 mmol/L   Glucose, Bld 90 65 - 99 mg/dL   BUN 14 6 - 20 mg/dL   Creatinine, Ser 0.84 0.61 - 1.24 mg/dL   Calcium 9.3 8.9 - 10.3 mg/dL   Total Protein 7.2 6.5 - 8.1 g/dL   Albumin 4.7 3.5 - 5.0 g/dL   AST 24 15 - 41 U/L   ALT 14 (L) 17 - 63 U/L   Alkaline Phosphatase 61 38 - 126 U/L   Total Bilirubin 0.3 0.3 - 1.2 mg/dL   GFR calc non Af Amer >60 >60 mL/min   GFR calc Af Amer >60 >60 mL/min    Comment: (NOTE) The eGFR has been calculated using the CKD EPI equation. This calculation has not been validated in all clinical situations. eGFR's persistently <60 mL/min signify possible Chronic Kidney Disease.    Anion gap 6 5 - 15  Ethanol     Status: None   Collection Time: 07/11/16  7:13 PM  Result Value Ref Range   Alcohol, Ethyl (B) <5 <5 mg/dL    Comment:        LOWEST DETECTABLE LIMIT FOR SERUM ALCOHOL IS 5 mg/dL FOR MEDICAL  PURPOSES ONLY   Salicylate level     Status: None   Collection Time: 07/11/16  7:13 PM  Result Value Ref Range   Salicylate Lvl <4.0 2.8 - 30.0 mg/dL  Acetaminophen level     Status: Abnormal   Collection Time: 07/11/16  7:13 PM  Result Value Ref Range   Acetaminophen (Tylenol), Serum <10 (L) 10 - 30 ug/mL    Comment:        THERAPEUTIC CONCENTRATIONS VARY SIGNIFICANTLY. A RANGE OF 10-30 ug/mL MAY BE AN EFFECTIVE CONCENTRATION FOR MANY PATIENTS. HOWEVER, SOME ARE BEST TREATED AT CONCENTRATIONS OUTSIDE THIS RANGE. ACETAMINOPHEN CONCENTRATIONS >150 ug/mL AT 4 HOURS AFTER INGESTION AND >50 ug/mL AT 12 HOURS AFTER INGESTION ARE OFTEN ASSOCIATED WITH TOXIC REACTIONS.   cbc     Status: None   Collection Time: 07/11/16  7:13 PM  Result Value Ref Range   WBC  7.3 3.8 - 10.6 K/uL   RBC 4.99 4.40 - 5.90 MIL/uL   Hemoglobin 15.5 13.0 - 18.0 g/dL   HCT 44.5 40.0 - 52.0 %   MCV 89.3 80.0 - 100.0 fL   MCH 31.1 26.0 - 34.0 pg   MCHC 34.8 32.0 - 36.0 g/dL   RDW 12.6 11.5 - 14.5 %   Platelets 259 150 - 440 K/uL    No current facility-administered medications for this encounter.    Current Outpatient Prescriptions  Medication Sig Dispense Refill  . ARIPiprazole (ABILIFY) 5 MG tablet Take 5 mg by mouth daily.    . ARIPiprazole 400 MG SUSR Inject 400 mg into the muscle every 28 (twenty-eight) days. 1 each 1  . hydrOXYzine (VISTARIL) 25 MG capsule Take 1 capsule (25 mg total) by mouth every 8 (eight) hours as needed for anxiety. 20 capsule 0    Musculoskeletal: Strength & Muscle Tone: within normal limits Gait & Station: normal Patient leans: N/A  Psychiatric Specialty Exam: Physical Exam  Nursing note and vitals reviewed. Constitutional: He appears well-developed and well-nourished.  HENT:  Head: Normocephalic and atraumatic.  Eyes: Conjunctivae are normal. Pupils are equal, round, and reactive to light.  Neck: Normal range of motion.  Cardiovascular: Normal heart sounds.    Respiratory: Effort normal.  GI: Soft.  Musculoskeletal: Normal range of motion.  Neurological: He is alert.  Skin: Skin is warm and dry.  Psychiatric: His speech is normal. Judgment normal. His affect is blunt. He is slowed. He is not agitated. Cognition and memory are normal. He expresses no suicidal ideation.    Review of Systems  Constitutional: Negative.   HENT: Negative.   Eyes: Negative.   Respiratory: Negative.   Cardiovascular: Negative.   Gastrointestinal: Negative.   Musculoskeletal: Negative.   Skin: Negative.   Neurological: Negative.   Psychiatric/Behavioral: Negative for depression, hallucinations, memory loss, substance abuse and suicidal ideas. The patient is not nervous/anxious and does not have insomnia.     Blood pressure (!) 106/56, pulse 85, temperature 98 F (36.7 C), temperature source Oral, resp. rate 16, height _0  (1.88 m), weight 94.8 kg (209 lb), SpO2 98 %.Body mass index is 26.83 kg/m.  General Appearance: Disheveled  Eye Contact:  Fair  Speech:  Slow  Volume:  Decreased  Mood:  Euthymic  Affect:  Constricted  Thought Process:  Goal Directed  Orientation:  Full (Time, Place, and Person)  Thought Content:  Logical  Suicidal Thoughts:  No  Homicidal Thoughts:  No  Memory:  Immediate;   Good Recent;   Fair Remote;   Fair  Judgement:  Fair  Insight:  Fair  Psychomotor Activity:  Decreased  Concentration:  Concentration: Fair  Recall:  [employer] of Knowledge:  Fair  Language:  Fair  Akathisia:  No  Handed:  Right  AIMS (if indicated):     Assets:  Desire for Improvement Financial Resources/Insurance Housing Physical Health  ADL's:  Intact  Cognition:  WNL  Sleep:        Treatment Plan Summary: Plan This is a 30 year old man with schizophrenia. He came to the emergency room voluntarily. He said he was having racing thoughts. This is the same complaint he has made in the past. He did not show any obvious behavior problems and can't  identify any particular stress. Patient is currently calm and cooperative. No indication of dangerousness or need for hospitalization. Supportive counseling completed. He is not on IVC. He can be discharged back to  his group home and follow-up with his outpatient provider.  Disposition: Patient does not meet criteria for psychiatric inpatient admission. Supportive therapy provided about ongoing stressors.  Alethia Berthold, MD 07/12/2016 3:22 PM

## 2016-07-12 NOTE — Progress Notes (Signed)

## 2016-07-12 NOTE — ED Notes (Signed)
Awake and alert eating breakfast. Cooperative with nurse.   Maintained on 15 minute checks and observation by security camera for safety.  Patient resting quietly in room. No noted distress or abnormal behaviors noted. Will continue 15 minute checks and observation by security camera for safety.

## 2016-07-12 NOTE — ED Notes (Signed)
Patient is currently in dayroom resting. No signs of distress noted. Maintained on 15 minute checks and observation by security camera for safety.  

## 2016-07-12 NOTE — Discharge Instructions (Signed)
Follow-up with [facility] return as needed

## 2016-07-12 NOTE — ED Notes (Signed)
Patient resting quietly in room. No noted distress or abnormal behaviors noted. Cooperative on interaction with nurse. Will continue 15 minute checks and observation by security camera for safety.

## 2016-09-03 ENCOUNTER — Emergency Department
Admission: EM | Admit: 2016-09-03 | Discharge: 2016-09-04 | Disposition: A | Discharge status: Home / Self Care | Payer: Medicare Other | Attending: Emergency Medicine | Admitting: Emergency Medicine

## 2016-09-03 ENCOUNTER — Encounter: Payer: Self-pay | Admitting: Emergency Medicine

## 2016-09-03 DIAGNOSIS — F419 Anxiety disorder, unspecified: Secondary | ICD-10-CM

## 2016-09-03 DIAGNOSIS — F29 Unspecified psychosis not due to a substance or known physiological condition: Secondary | ICD-10-CM | POA: Insufficient documentation

## 2016-09-03 DIAGNOSIS — F17210 Nicotine dependence, cigarettes, uncomplicated: Secondary | ICD-10-CM | POA: Insufficient documentation

## 2016-09-03 DIAGNOSIS — R440 Auditory hallucinations: Secondary | ICD-10-CM | POA: Diagnosis present

## 2016-09-03 DIAGNOSIS — Z79899 Other long term (current) drug therapy: Secondary | ICD-10-CM | POA: Diagnosis not present

## 2016-09-03 LAB — COMPREHENSIVE METABOLIC PANEL
ALK PHOS: 64 U/L (ref 38–126)
ALT: 18 U/L (ref 17–63)
ANION GAP: 7 (ref 5–15)
AST: 26 U/L (ref 15–41)
Albumin: 4.5 g/dL (ref 3.5–5.0)
BILIRUBIN TOTAL: 1.4 mg/dL — AB (ref 0.3–1.2)
BUN: 19 mg/dL (ref 6–20)
CALCIUM: 9.2 mg/dL (ref 8.9–10.3)
CO2: 27 mmol/L (ref 22–32)
CREATININE: 1.08 mg/dL (ref 0.61–1.24)
Chloride: 104 mmol/L (ref 101–111)
GFR calc non Af Amer: >60 mL/min (ref >60)
Glucose, Bld: 110 mg/dL — ABNORMAL HIGH (ref 65–99)
Potassium: 4.1 mmol/L (ref 3.5–5.1)
SODIUM: 138 mmol/L (ref 135–145)
TOTAL PROTEIN: 7 g/dL (ref 6.5–8.1)

## 2016-09-03 LAB — ACETAMINOPHEN LEVEL: Acetaminophen (Tylenol), Serum: <10 ug/mL — ABNORMAL LOW (ref 10–30)

## 2016-09-03 LAB — CBC
HEMATOCRIT: 45.3 % (ref 40.0–52.0)
HEMOGLOBIN: 15.9 g/dL (ref 13.0–18.0)
MCH: 30.9 pg (ref 26.0–34.0)
MCHC: 35.1 g/dL (ref 32.0–36.0)
MCV: 87.9 fL (ref 80.0–100.0)
Platelets: 255 10*3/uL (ref 150–440)
RBC: 5.16 MIL/uL (ref 4.40–5.90)
RDW: 12.7 % (ref 11.5–14.5)
WBC: 6.9 10*3/uL (ref 3.8–10.6)

## 2016-09-03 LAB — SALICYLATE LEVEL

## 2016-09-03 LAB — ETHANOL: Alcohol, Ethyl (B): <5 mg/dL (ref <5)

## 2016-09-03 MED ORDER — LORAZEPAM 1 MG PO TABS: 1.0000 mg | ORAL_TABLET | Freq: Two times a day (BID) | ORAL | 0 refills | Status: DC | PRN

## 2016-09-03 NOTE — ED Notes (Signed)
Spoke with Amy for B[facility] to transfer pt over, pt is currently speaking with TTS, [facility]

## 2016-09-03 NOTE — ED Notes (Signed)

## 2016-09-03 NOTE — ED Provider Notes (Signed)
[facility] Emergency Department Provider Note  ____________________________________________   First MD Initiated Contact with Patient 09/03/16 1433     (approximate)  I have reviewed the triage vital signs and the nursing notes.   HISTORY  Chief Complaint Psychiatric Evaluation   HPI Edwin Montes is a 30 y.o. male with a history of anxiety and schizophrenia who is presenting to the emergency department today with racing thoughts. He says that he has been sleeping well and is compliant on his Abilify. However, despite this, he says that he needs to be evaluated for his anxiety and depression. He does not note any specific events that triggered him to decompensate. He says that he is sleeping well at home. Says that he has auditory hallucinations but does not say anything specific that voices are telling him to do.Denies any suicidal or homicidal ideation.   Past Medical History:  Diagnosis Date  . Anxiety   . Schizophrenia [facility])     Patient Active Problem List   Diagnosis Date Noted  . Panic attack 06/23/2016  . Noncompliance 02/29/2016  . 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 Tobacco use disorder 01/27/2016    Past Surgical History:  Procedure Laterality Date  . NO PAST SURGERIES      Prior to Admission medications   Medication Sig Start Date End Date Taking? Authorizing Provider  ARIPiprazole (ABILIFY) 5 MG tablet Take 5 mg by mouth daily.    Historical Provider, MD  ARIPiprazole 400 MG SUSR Inject 400 mg into the muscle every 28 (twenty-eight) days. 03/02/16   Shari Prows, MD  hydrOXYzine (VISTARIL) 25 MG capsule Take 1 capsule (25 mg total) by mouth every 8 (eight) hours as needed for anxiety. 06/13/16   Rebecka Apley, MD    Allergies Review of patient's allergies indicates no known allergies.  No family history on file.  Social History Social History  Substance Use Topics  . Smoking status: Current Some Day  Smoker    Types: Cigarettes  . Smokeless tobacco: Never Used  . Alcohol use No    Review of Systems Constitutional: No fever/chills Eyes: No visual changes. ENT: No sore throat. Cardiovascular: Denies chest pain. Respiratory: Denies shortness of breath. Gastrointestinal: No abdominal pain.  No nausea, no vomiting.  No diarrhea.  No constipation. Genitourinary: Negative for dysuria. Musculoskeletal: Negative for back pain. Skin: Negative for rash. Neurological: Negative for headaches, focal weakness or numbness.  10-point ROS otherwise negative.  ____________________________________________   PHYSICAL EXAM:  VITAL SIGNS: ED Triage Vitals  Enc Vitals Group     BP 09/03/16 1419 127/74     Pulse Rate 09/03/16 1419 86     Resp 09/03/16 1419 18     Temp 09/03/16 1419 98.5 F (36.9 C)     Temp Source 09/03/16 1419 Oral     SpO2 09/03/16 1419 98 %     Weight 09/03/16 1419 208 lb (94.3 kg)     Height 09/03/16 1419 6\' 2"  (1.88 m)     Head Circumference --      Peak Flow --      Pain Score 09/03/16 1420 0     Pain Loc --      Pain Edu? --      Excl. in GC? --     Constitutional: Alert and oriented. Well appearing and in no acute distress. Eyes: Conjunctivae are normal. PERRL. EOMI. Head: Atraumatic. Nose: No congestion/rhinnorhea. Mouth/Throat: Mucous membranes are moist.   Neck: No stridor.  Cardiovascular: Normal rate, regular rhythm. Grossly normal heart sounds.   Respiratory: Normal respiratory effort.  No retractions. Lungs CTAB. Gastrointestinal: Soft and nontender. No distention. Musculoskeletal: No lower extremity tenderness nor edema.  No joint effusions. Neurologic:  Normal speech and language. No gross focal neurologic deficits are appreciated.  Skin:  Skin is warm, dry and intact. No rash noted. Psychiatric: Flat affect. Poor eye contact.  ____________________________________________   LABS (all labs ordered are listed, but only abnormal results are  displayed)  Labs Reviewed  COMPREHENSIVE METABOLIC PANEL - Abnormal; Notable for the following:       Result Value   Glucose, Bld 110 (*)    Total Bilirubin 1.4 (*)    All other components within normal limits  ACETAMINOPHEN LEVEL - Abnormal; Notable for the following:    Acetaminophen (Tylenol), Serum <10 (*)    All other components within normal limits  ETHANOL  SALICYLATE LEVEL  CBC  URINE DRUG SCREEN, QUALITATIVE ([facility] ONLY)   ____________________________________________  EKG   ____________________________________________  RADIOLOGY   ____________________________________________   PROCEDURES  Procedure(s) performed:   Procedures  Critical Care performed:   ____________________________________________   INITIAL IMPRESSION / ASSESSMENT AND PLAN / ED COURSE  Pertinent labs & imaging results that were available during my care of the patient were reviewed by me and considered in my medical decision making (see chart for details).  I'm concerned with the patient's affect and his chief complaint. I believe him to be acutely psychotic at this time. I told him that my recommendation is for him to be put under an involuntary commitment and to be seen by the psychiatrist. He is understanding of this plan and willing to comply.  Clinical Course     ____________________________________________   FINAL CLINICAL IMPRESSION(S) / ED DIAGNOSES  Psychosis.    NEW MEDICATIONS STARTED DURING THIS VISIT:  New Prescriptions   No medications on file     Note:  This document was prepared using Dragon voice recognition software and may include unintentional dictation errors.    Myrna Blazeravid Matthew Donnajean Chesnut, MD 09/03/16 [phone]

## 2016-09-03 NOTE — ED Triage Notes (Signed)
Pt arrived via EMS from home with c/o racing thoughts that started yesterday throughout the day. Pt denies any SI/HI but only c/o the racing thoughts. Pt gets a monthly injection for the past 3 months and states he has not missed any doses. Pt has blunted affect.

## 2016-09-03 NOTE — ED Notes (Signed)
Called [facility] for consult at [phone]

## 2016-09-03 NOTE — ED Provider Notes (Signed)
-----------------------------------------   7:25 PM on 09/03/2016 -----------------------------------------   Blood pressure 127/74, pulse 86, temperature 98.5 F (36.9 C), temperature source Oral, resp. rate 18, height 6\' 2"  (1.88 m), weight 208 lb (94.3 kg), SpO2 98 %.  Assuming care from Dr. Marchelle FolksSchaevit.  In short, Garrel RidgelCameron Heinicke is a 30 y.o. male with a chief complaint of Psychiatric Evaluation .  Refer to the original H&P for additional details.  The current plan of care is to follow the results of telemetry psychiatry. The patient is felt to be no longer a concern to be held here in the emergency department. The patient has been medically and psychiatrically cleared per psychiatry and IVC paperwork is been rescinded.  Recommended outpatient follow-up and Ativan when necessary for anxiety.   Jennye MoccasinBrian S Victorya Hillman, MD 09/03/16 [phone]

## 2016-09-03 NOTE — ED Notes (Signed)
Report was received from Amy H., RN; Pt. Verbalizes no complaints or distress; denies S.I./Hi. Continue to monitor with 15 min. Monitoring. 

## 2016-09-03 NOTE — BH Assessment (Signed)
Tele Assessment Note   Edwin Montes is an 30 y.o. male.  Brought into ED by EMS; pt states he has racing thoughts of how he can stay stable on his mental health; no HI/SI present or past; pt states that he hears voices but cannot understand what the voices are saying; no visual hallucinations; no history of alcohol or drug use; pt states he is currently under care with [facility] and is on an ACT Team, but cannot provide a name; pt states he did not think to call the mobile crisis number before calling 911; pt states he has not missed any medication dosage; no history of aggressive behaviors; pt was cooperative during assessment; no eye contact provided; speech was short and pressured; pt again stated he was not in danger or fear of harm to himself;   Diagnosis:  Axis I: Schizophrenia Axis II: Deferred Axis III: none noted Axis IV: access to mental health    Past Medical History:  Past Medical History:  Diagnosis Date  . Anxiety   . Schizophrenia [facility])     Past Surgical History:  Procedure Laterality Date  . NO PAST SURGERIES      Family History: No family history on file.  Social History:  reports that he has been smoking Cigarettes.  He has never used smokeless tobacco. He reports that he does not drink alcohol or use drugs.  Additional Social History:     CIWA: CIWA-Ar BP: 127/74 Pulse Rate: 86 COWS:    PATIENT STRENGTHS: (choose at least two) Communication skills Supportive family/friends  Allergies: No Known Allergies  Home Medications:  (Not in a hospital admission)  OB/GYN Status:  No LMP for male patient.  General Assessment Data Location of Assessment: (P) [facility] ED TTS Assessment: (P) In system Is this a Tele or Face-to-Face Assessment?: (P) Face-to-Face Is this an Initial Assessment or a Re-assessment for this encounter?: (P) Initial Assessment Marital status: (P) Single           Risk to self with the past 6 months Suicidal Ideation: (P) No Has patient  been a risk to self within the past 6 months prior to admission? : (P) No Suicidal Intent: (P) No Has patient had any suicidal intent within the past 6 months prior to admission? : (P) No Is patient at risk for suicide?: (P) No Suicidal Plan?: (P) No Has patient had any suicidal plan within the past 6 months prior to admission? : (P) No Access to Means: (P) Yes Specify Access to Suicidal Means: (P) household items What has been your use of drugs/alcohol within the last 12 months?: (P) reports none Previous Attempts/Gestures: (P) No How many times?: (P) 0 Other Self Harm Risks: (P) none reported Triggers for Past Attempts: (P) None known Intentional Self Injurious Behavior: (P) None Family Suicide History: (P) No Recent stressful life event(s): (P) Other (Comment) (none noted) Persecutory voices/beliefs?: (P) Yes (hear voices but cannot understand them) Depression: (P) Yes Depression Symptoms: (P)  (mentioned sadness) Substance abuse history and/or treatment for substance abuse?: (P) No Suicide prevention information given to non-admitted patients: (P) Not applicable  Risk to Others within the past 6 months Homicidal Ideation: (P) No Does patient have any lifetime risk of violence toward others beyond the six months prior to admission? : (P) No Thoughts of Harm to Others: (P) No Current Homicidal Intent: (P) No Current Homicidal Plan: (P) No Access to Homicidal Means: (P) Yes  Disposition:   SOC to See  Edwin Montes, Edwin Montes 09/03/2016 4:24 PM

## 2016-09-03 NOTE — ED Notes (Signed)
Pt changed into scrubs. Clothing, keys, and wallet (with $2 and cards) placed in belongings bag and then lockers.

## 2016-09-03 NOTE — ED Notes (Signed)
Patient received meal tray 

## 2016-09-03 NOTE — Discharge Instructions (Signed)
Please return immediately if condition worsens. Please contact her primary physician or the physician you were given for referral. If you have any specialist physicians involved in her treatment and plan please also contact them. Thank you for using [facility].  Please follow-up outpatient psychiatry

## 2016-09-03 NOTE — ED Notes (Signed)
Patient having SOC assessment at present.

## 2016-09-03 NOTE — ED Notes (Signed)
ENVIRONMENTAL ASSESSMENT Potentially harmful objects out of patient reach: Yes Personal belongings secured: Yes Patient dressed in hospital provided attire only: Yes Plastic bags out of patient reach: Yes Patient care equipment (cords, cables, call bells, lines, and drains) shortened, removed, or accounted for: Yes Equipment and supplies removed from bottom of stretcher: Yes Potentially toxic materials out of patient reach: Yes Sharps container removed or out of patient reach: Yes  Patient present to ED with complaints of "racing thoughts." Denies SI or HI. No gross psychosis noted. Patient states he has been compliant with monthly Abilify injections.   Patient assigned to appropriate care area. Patient oriented to unit/care area: Informed that, for their safety, care areas are designed for safety and monitored by security cameras at all times; and visiting hours explained to patient. Patient verbalizes understanding, and verbal contract for safety obtained.

## 2016-09-04 NOTE — ED Notes (Signed)
Pt reports that he has the means to pay for a cab to take him home, Edwin Montes called to pick up pt from ED entrance, pt belongings returned and patient escorted out to waiting area.

## 2016-11-01 ENCOUNTER — Emergency Department
Admission: EM | Admit: 2016-11-01 | Discharge: 2016-11-03 | Disposition: A | Discharge status: Other Acute Inpatient Hospital | Payer: Medicare Other | Attending: Emergency Medicine | Admitting: Emergency Medicine

## 2016-11-01 ENCOUNTER — Encounter: Payer: Self-pay | Admitting: Emergency Medicine

## 2016-11-01 DIAGNOSIS — F203 Undifferentiated schizophrenia: Secondary | ICD-10-CM | POA: Diagnosis not present

## 2016-11-01 DIAGNOSIS — F209 Schizophrenia, unspecified: Secondary | ICD-10-CM | POA: Diagnosis not present

## 2016-11-01 DIAGNOSIS — F1210 Cannabis abuse, uncomplicated: Secondary | ICD-10-CM | POA: Insufficient documentation

## 2016-11-01 DIAGNOSIS — F410 Panic disorder [episodic paroxysmal anxiety] without agoraphobia: Secondary | ICD-10-CM | POA: Diagnosis present

## 2016-11-01 DIAGNOSIS — F17210 Nicotine dependence, cigarettes, uncomplicated: Secondary | ICD-10-CM | POA: Insufficient documentation

## 2016-11-01 DIAGNOSIS — Z5181 Encounter for therapeutic drug level monitoring: Secondary | ICD-10-CM | POA: Insufficient documentation

## 2016-11-01 DIAGNOSIS — Z046 Encounter for general psychiatric examination, requested by authority: Secondary | ICD-10-CM | POA: Diagnosis present

## 2016-11-01 DIAGNOSIS — F419 Anxiety disorder, unspecified: Secondary | ICD-10-CM

## 2016-11-01 LAB — COMPREHENSIVE METABOLIC PANEL
ALBUMIN: 4.6 g/dL (ref 3.5–5.0)
ALK PHOS: 66 U/L (ref 38–126)
ALT: 20 U/L (ref 17–63)
ANION GAP: 5 (ref 5–15)
AST: 26 U/L (ref 15–41)
BUN: 15 mg/dL (ref 6–20)
CALCIUM: 9.1 mg/dL (ref 8.9–10.3)
CO2: 30 mmol/L (ref 22–32)
CREATININE: 0.92 mg/dL (ref 0.61–1.24)
Chloride: 106 mmol/L (ref 101–111)
GFR calc Af Amer: >60 mL/min (ref >60)
GFR calc non Af Amer: >60 mL/min (ref >60)
GLUCOSE: 91 mg/dL (ref 65–99)
Potassium: 4.2 mmol/L (ref 3.5–5.1)
SODIUM: 141 mmol/L (ref 135–145)
Total Bilirubin: 0.3 mg/dL (ref 0.3–1.2)
Total Protein: 7.2 g/dL (ref 6.5–8.1)

## 2016-11-01 LAB — URINE DRUG SCREEN, QUALITATIVE (ARMC ONLY)
Amphetamines, Ur Screen: NOT DETECTED
BARBITURATES, UR SCREEN: NOT DETECTED
Benzodiazepine, Ur Scrn: NOT DETECTED
CANNABINOID 50 NG, UR ~~LOC~~: POSITIVE — AB
COCAINE METABOLITE, UR ~~LOC~~: NOT DETECTED
MDMA (Ecstasy)Ur Screen: NOT DETECTED
Methadone Scn, Ur: NOT DETECTED
OPIATE, UR SCREEN: NOT DETECTED
PHENCYCLIDINE (PCP) UR S: NOT DETECTED
TRICYCLIC, UR SCREEN: NOT DETECTED

## 2016-11-01 LAB — CBC WITH DIFFERENTIAL/PLATELET
Basophils Absolute: 0.1 10*3/uL (ref 0–0.1)
Basophils Relative: 1 %
EOS ABS: 0.5 10*3/uL (ref 0–0.7)
Eosinophils Relative: 6 %
HCT: 45.4 % (ref 40.0–52.0)
HEMOGLOBIN: 15.9 g/dL (ref 13.0–18.0)
LYMPHS ABS: 3.2 10*3/uL (ref 1.0–3.6)
LYMPHS PCT: 40 %
MCH: 30.8 pg (ref 26.0–34.0)
MCHC: 35.1 g/dL (ref 32.0–36.0)
MCV: 87.6 fL (ref 80.0–100.0)
MONOS PCT: 8 %
Monocytes Absolute: 0.7 10*3/uL (ref 0.2–1.0)
NEUTROS PCT: 45 %
Neutro Abs: 3.5 10*3/uL (ref 1.4–6.5)
Platelets: 274 10*3/uL (ref 150–440)
RBC: 5.18 MIL/uL (ref 4.40–5.90)
RDW: 12.9 % (ref 11.5–14.5)
WBC: 8 10*3/uL (ref 3.8–10.6)

## 2016-11-01 LAB — ETHANOL: Alcohol, Ethyl (B): <5 mg/dL (ref <5)

## 2016-11-01 NOTE — ED Provider Notes (Signed)
[facility] Emergency Department Provider Note   ____________________________________________   First MD Initiated Contact with Patient 11/01/16 2340     (approximate)  I have reviewed the triage vital signs and the nursing notes.   HISTORY  Chief Complaint Anxiety and Psychiatric Evaluation    HPI Edwin Montes is a 30 y.o. male who presents to the ED via EMS with a chief complaint of racing thoughts and anxiety. History of schizophrenia who takes Abilify injections. Patient denies active SI/HI/AH/VH. Reports the racing thoughts are not threatening but to provoke his anxiety. Voices no medical complaints.   Past Medical History:  Diagnosis Date  . Anxiety   . Schizophrenia [facility](HCC)     Patient Active Problem List   Diagnosis Date Noted  . Panic attack 06/23/2016  . Noncompliance 02/29/2016  . 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 Tobacco use disorder 01/27/2016    Past Surgical History:  Procedure Laterality Date  . NO PAST SURGERIES      Prior to Admission medications   Medication Sig Start Date End Date Taking? Authorizing Provider  ARIPiprazole (ABILIFY) 5 MG tablet Take 5 mg by mouth daily.    Historical Provider, MD  ARIPiprazole 400 MG SUSR Inject 400 mg into the muscle every 28 (twenty-eight) days. 03/02/16   Shari ProwsJolanta B Pucilowska, MD  hydrOXYzine (VISTARIL) 25 MG capsule Take 1 capsule (25 mg total) by mouth every 8 (eight) hours as needed for anxiety. 06/13/16   Rebecka ApleyAllison P Webster, MD  LORazepam (ATIVAN) 1 MG tablet Take 1 tablet (1 mg total) by mouth 2 (two) times daily as needed for anxiety. 09/03/16   Jennye MoccasinBrian S Quigley, MD    Allergies Patient has no known allergies.  No family history on file.  Social History Social History  Substance Use Topics  . Smoking status: Current Some Day Smoker    Types: Cigarettes  . Smokeless tobacco: Never Used  . Alcohol use No    Review of Systems  Constitutional: No  fever/chills. Eyes: No visual changes. ENT: No sore throat. Cardiovascular: Denies chest pain. Respiratory: Denies shortness of breath. Gastrointestinal: No abdominal pain.  No nausea, no vomiting.  No diarrhea.  No constipation. Genitourinary: Negative for dysuria. Musculoskeletal: Negative for back pain. Skin: Negative for rash. Neurological: Negative for headaches, focal weakness or numbness. Psychiatric:Positive for anxiety and racing thoughts.  10-point ROS otherwise negative.  ____________________________________________   PHYSICAL EXAM:  VITAL SIGNS: ED Triage Vitals  Enc Vitals Group     BP 11/01/16 2242 132/78     Pulse Rate 11/01/16 2242 88     Resp 11/01/16 2242 18     Temp 11/01/16 2242 98.4 F (36.9 C)     Temp Source 11/01/16 2242 Oral     SpO2 11/01/16 2242 97 %     Weight 11/01/16 2243 208 lb (94.3 kg)     Height 11/01/16 2243 6\' 2"  (1.88 m)     Head Circumference --      Peak Flow --      Pain Score 11/01/16 2243 0     Pain Loc --      Pain Edu? --      Excl. in GC? --     Constitutional: Alert and oriented. Well appearing and in no acute distress. Eyes: Conjunctivae are normal. PERRL. EOMI. Head: Atraumatic. Nose: No congestion/rhinnorhea. Mouth/Throat: Mucous membranes are moist.  Oropharynx non-erythematous. Neck: No stridor.   Cardiovascular: Normal rate, regular rhythm. Grossly normal heart sounds.  Good  peripheral circulation. Respiratory: Normal respiratory effort.  No retractions. Lungs CTAB. Gastrointestinal: Soft and nontender. No distention. No abdominal bruits. No CVA tenderness. Musculoskeletal: No lower extremity tenderness nor edema.  No joint effusions. Neurologic:  Normal speech and language. No gross focal neurologic deficits are appreciated. No gait instability. Skin:  Skin is warm, dry and intact. No rash noted. Psychiatric: Mood and affect are mildly anxious. Speech and behavior are  normal.  ____________________________________________   LABS (all labs ordered are listed, but only abnormal results are displayed)  Labs Reviewed  URINE DRUG SCREEN, QUALITATIVE ([facility] ONLY) - Abnormal; Notable for the following:       Result Value   Cannabinoid 50 Ng, Ur [place] POSITIVE (*)    All other components within normal limits  COMPREHENSIVE METABOLIC PANEL  ETHANOL  CBC WITH DIFFERENTIAL/PLATELET   ____________________________________________  EKG  None ____________________________________________  RADIOLOGY  None ____________________________________________   PROCEDURES  Procedure(s) performed: None  Procedures  Critical Care performed: No  ____________________________________________   INITIAL IMPRESSION / ASSESSMENT AND PLAN / ED COURSE  Pertinent labs & imaging results that were available during my care of the patient were reviewed by me and considered in my medical decision making (see chart for details).  30 year old male with schizophrenia, anxiety who presents with racing thoughts and escalating anxiety level. Denies SI/HI. Laboratory urinalysis results are unremarkable except for cannabinoids. At this time he is medically cleared and will remain in the emergency department on a voluntary basis any TTS and psychiatry consults.  Clinical Course      ____________________________________________   FINAL CLINICAL IMPRESSION(S) / ED DIAGNOSES  Final diagnoses:  Anxiety  Schizophrenia, unspecified type (HCC)  Marijuana abuse      NEW MEDICATIONS STARTED DURING THIS VISIT:  New Prescriptions   No medications on file     Note:  This document was prepared using Dragon voice recognition software and may include unintentional dictation errors.    Irean HongJade J Sung, MD 11/02/16 [phone]

## 2016-11-01 NOTE — ED Triage Notes (Signed)
Pt presents to ED via [facility] with c/o racing thoughts and anxiety. Hx of the same. Unsure of trigger. Denies SI or HI.

## 2016-11-02 DIAGNOSIS — F203 Undifferentiated schizophrenia: Secondary | ICD-10-CM

## 2016-11-02 DIAGNOSIS — F209 Schizophrenia, unspecified: Secondary | ICD-10-CM | POA: Diagnosis not present

## 2016-11-02 MED ORDER — ARIPIPRAZOLE 5 MG PO TABS
5.0000 mg | ORAL_TABLET | Freq: Every day | ORAL | Status: DC
  Administered 2016-11-02: 5 mg via ORAL
  Filled 2016-11-02: qty 1

## 2016-11-02 MED ORDER — HYDROXYZINE HCL 25 MG PO TABS: 25.0000 mg | ORAL_TABLET | Freq: Three times a day (TID) | ORAL | Status: DC | PRN

## 2016-11-02 MED ORDER — LORAZEPAM 1 MG PO TABS: 1.0000 mg | ORAL_TABLET | Freq: Two times a day (BID) | ORAL | Status: DC | PRN

## 2016-11-02 NOTE — ED Notes (Signed)
Lunch trays given to Patient. Patient awake and alert, patient without signs of distress, denies Si/hi or avh, q 15 minute checks and camera monitoring in progress.

## 2016-11-02 NOTE — ED Notes (Signed)
Patient is alert and oriented, wanted to watch tv now, nurse gave him the remote, patient denies Si/hi or avh, camera monitoring in progress and q 15 minute checks.

## 2016-11-02 NOTE — ED Notes (Addendum)
PT is alert and oriented on admission. Pt mood is sad/anxious, but he is pleasant and cooperative with staff. Pt denies SI/HI and AVH and states that he was administered Abilify SUSR 28 day dose approximately 3 weeks ago by ACT team. Writer oriented pt to the [facility] and 15 minute checks are ongoing for safety.

## 2016-11-02 NOTE — ED Notes (Signed)
Patient's lunch tray served.

## 2016-11-02 NOTE — Consult Note (Signed)
[facility] Consult   Reason for Consult:  Consult for 30 year old man with a history of schizophrenia who comes to the hospital reporting "racing thoughts" Referring Physician:  Quentin Cornwall Patient Identification: Edwin Montes MRN:  [id number] Principal Diagnosis: Undifferentiated schizophrenia [facility]) Diagnosis:   Patient Active Problem List   Diagnosis Date Noted  . Panic attack [F41.0] 06/23/2016  . Noncompliance [Z91.19] 02/29/2016  . Undifferentiated schizophrenia ([place]) [F20.3] 01/27/2016  . Tobacco use disorder [F17.200] 01/27/2016    Total Time spent with patient: 1 hour  Subjective:   Edwin Montes is a 30 y.o. male patient admitted with "I've been having racing thoughts".  HPI:  Patient interviewed. Chart reviewed. 30 year old man with schizophrenia. Came to the emergency room of his own volition last night reporting that he was having racing thoughts. He says that these of been going on for the last couple days. As usual and is rather vague. He has trouble connecting them to any particular stimulus. After sitting with him for a while I was able to get the sense that the Christmas holidays may be part of the stress. Apparently he was scheduled to go and stay with his family again over Christmas and while he insists that he had a good time last time he went and doesn't have any specific worry perhaps that's the issue. He doesn't have any other real complaints. He does tell me that he has had some suicidal thoughts but has no intent or plan and it has been very vaguely in his mind. Mood has been down and nervous. No new medical problems. He is on and Abilify long-acting shot. He thinks he probably got the last one about 3 weeks ago. Not abusing alcohol or drugs.  Social history: He lives independently in an apartment. Has follow-up monitoring from a mental health team. Family lives in [place]. He won't or can't describe any particular new stressor.  Medical history:  Denies any significant or specific medical complaints currently.  Substance abuse history: Patient denies any current or past alcohol or drug abuse  Past Psychiatric History: Patient has had multiple visits to our emergency room and they are almost always exactly the same. He will complain of racing thoughts but he never appears to be manic or even particularly agitated. There is no evidence that he is ever tried to kill himself or been violent in the past. He is maintained on long-acting antipsychotic.  Risk to Self: Suicidal Ideation: No Suicidal Intent: No Is patient at risk for suicide?: No Suicidal Plan?: No Access to Means: No What has been your use of drugs/alcohol within the last 12 months?: Pt denies drug use How many times?: 0 Other Self Harm Risks: None identified Triggers for Past Attempts: None known Intentional Self Injurious Behavior: None Risk to Others: Homicidal Ideation: No Thoughts of Harm to Others: No Current Homicidal Intent: No Current Homicidal Plan: No Access to Homicidal Means: No Describe Access to Homicidal Means: None identified Identified Victim: None identified History of harm to others?: No Assessment of Violence: None Noted Violent Behavior Description: None reported Does patient have access to weapons?: No Criminal Charges Pending?: No Does patient have a court date: No Prior Inpatient Therapy: Prior Inpatient Therapy: Yes Prior Therapy Dates: 2017, 2014, 2012 , 2009 Prior Therapy Facilty/Provider(s): [place], [place], [place], [facility], [facility] Reason for Treatment: Schizophrenia, depression Prior Outpatient Therapy: Prior Outpatient Therapy: Yes Prior Therapy Dates: current Prior Therapy Facilty/Provider(s): [facility] Reason for Treatment: Depression, Schizophrenia Does patient have an ACCT team?:  Yes Does patient have Intensive In-House Services?  : No Does patient have [facility] services? : No Does patient have P4CC services?: No  Past  Medical History:  Past Medical History:  Diagnosis Date  . Anxiety   . Schizophrenia [facility])     Past Surgical History:  Procedure Laterality Date  . NO PAST SURGERIES     Family History: No family history on file. Family Psychiatric  History: Denies any family history Social History:  History  Alcohol Use No     History  Drug Use No    Social History   Social History  . Marital status: Single    Spouse name: N/A  . Number of children: N/A  . Years of education: N/A   Social History Main Topics  . Smoking status: Current Some Day Smoker    Types: Cigarettes  . Smokeless tobacco: Never Used  . Alcohol use No  . Drug use: No  . Sexual activity: No   Other Topics Concern  . None   Social History Narrative  . None   Additional Social History:    Allergies:  No Known Allergies  Labs:  Results for orders placed or performed during the hospital encounter of 11/01/16 (from the past 48 hour(s))  Comprehensive metabolic panel     Status: None   Collection Time: 11/01/16 10:43 PM  Result Value Ref Range   Sodium 141 135 - 145 mmol/L   Potassium 4.2 3.5 - 5.1 mmol/L   Chloride 106 101 - 111 mmol/L   CO2 30 22 - 32 mmol/L   Glucose, Bld 91 65 - 99 mg/dL   BUN 15 6 - 20 mg/dL   Creatinine, Ser 0.92 0.61 - 1.24 mg/dL   Calcium 9.1 8.9 - 10.3 mg/dL   Total Protein 7.2 6.5 - 8.1 g/dL   Albumin 4.6 3.5 - 5.0 g/dL   AST 26 15 - 41 U/L   ALT 20 17 - 63 U/L   Alkaline Phosphatase 66 38 - 126 U/L   Total Bilirubin 0.3 0.3 - 1.2 mg/dL   GFR calc non Af Amer >60 >60 mL/min   GFR calc Af Amer >60 >60 mL/min    Comment: (NOTE) The eGFR has been calculated using the CKD EPI equation. This calculation has not been validated in all clinical situations. eGFR's persistently <60 mL/min signify possible Chronic Kidney Disease.    Anion gap 5 5 - 15  Ethanol     Status: None   Collection Time: 11/01/16 10:43 PM  Result Value Ref Range   Alcohol, Ethyl (B) <5 <5 mg/dL     Comment:        LOWEST DETECTABLE LIMIT FOR SERUM ALCOHOL IS 5 mg/dL FOR MEDICAL PURPOSES ONLY   CBC with Diff     Status: None   Collection Time: 11/01/16 10:43 PM  Result Value Ref Range   WBC 8.0 3.8 - 10.6 K/uL   RBC 5.18 4.40 - 5.90 MIL/uL   Hemoglobin 15.9 13.0 - 18.0 g/dL   HCT 45.4 40.0 - 52.0 %   MCV 87.6 80.0 - 100.0 fL   MCH 30.8 26.0 - 34.0 pg   MCHC 35.1 32.0 - 36.0 g/dL   RDW 12.9 11.5 - 14.5 %   Platelets 274 150 - 440 K/uL   Neutrophils Relative % 45 %   Neutro Abs 3.5 1.4 - 6.5 K/uL   Lymphocytes Relative 40 %   Lymphs Abs 3.2 1.0 - 3.6 K/uL   Monocytes Relative  8 %   Monocytes Absolute 0.7 0.2 - 1.0 K/uL   Eosinophils Relative 6 %   Eosinophils Absolute 0.5 0 - 0.7 K/uL   Basophils Relative 1 %   Basophils Absolute 0.1 0 - 0.1 K/uL  Urine Drug Screen, Qualitative ([facility] only)     Status: Abnormal   Collection Time: 11/01/16 10:44 PM  Result Value Ref Range   Tricyclic, Ur Screen NONE DETECTED NONE DETECTED   Amphetamines, Ur Screen NONE DETECTED NONE DETECTED   MDMA (Ecstasy)Ur Screen NONE DETECTED NONE DETECTED   Cocaine Metabolite,Ur [place] NONE DETECTED NONE DETECTED   Opiate, Ur Screen NONE DETECTED NONE DETECTED   Phencyclidine (PCP) Ur S NONE DETECTED NONE DETECTED   Cannabinoid 50 Ng, Ur [place] POSITIVE (A) NONE DETECTED   Barbiturates, Ur Screen NONE DETECTED NONE DETECTED   Benzodiazepine, Ur Scrn NONE DETECTED NONE DETECTED   Methadone Scn, Ur NONE DETECTED NONE DETECTED    Comment: (NOTE) 330  Tricyclics, urine               Cutoff 1000 ng/mL 200  Amphetamines, urine             Cutoff 1000 ng/mL 300  MDMA (Ecstasy), urine           Cutoff 500 ng/mL 400  Cocaine Metabolite, urine       Cutoff 300 ng/mL 500  Opiate, urine                   Cutoff 300 ng/mL 600  Phencyclidine (PCP), urine      Cutoff 25 ng/mL 700  Cannabinoid, urine              Cutoff 50 ng/mL 800  Barbiturates, urine             Cutoff 200 ng/mL 900  Benzodiazepine, urine            Cutoff 200 ng/mL 1000 Methadone, urine                Cutoff 300 ng/mL 1100 1200 The urine drug screen provides only a preliminary, unconfirmed 1300 analytical test result and should not be used for non-medical 1400 purposes. Clinical consideration and professional judgment should 1500 be applied to any positive drug screen result due to possible 1600 interfering substances. A more specific alternate chemical method 1700 must be used in order to obtain a confirmed analytical result.  1800 Gas chromato graphy / mass spectrometry (GC/MS) is the preferred 1900 confirmatory method.     Current Facility-Administered Medications  Medication Dose Route Frequency Provider Last Rate Last Dose  . ARIPiprazole (ABILIFY) tablet 5 mg  5 mg Oral Daily Paulette Blanch, MD   5 mg at 11/02/16 1001  . hydrOXYzine (ATARAX/VISTARIL) tablet 25 mg  25 mg Oral Q8H PRN Paulette Blanch, MD      . LORazepam (ATIVAN) tablet 1 mg  1 mg Oral BID PRN Paulette Blanch, MD       Current Outpatient Prescriptions  Medication Sig Dispense Refill  . ARIPiprazole (ABILIFY) 5 MG tablet Take 5 mg by mouth daily.    . ARIPiprazole 400 MG SUSR Inject 400 mg into the muscle every 28 (twenty-eight) days. 1 each 1  . hydrOXYzine (VISTARIL) 25 MG capsule Take 1 capsule (25 mg total) by mouth every 8 (eight) hours as needed for anxiety. 20 capsule 0  . LORazepam (ATIVAN) 1 MG tablet Take 1 tablet (1 mg total) by mouth 2 (two) times  daily as needed for anxiety. 10 tablet 0    Musculoskeletal: Strength & Muscle Tone: within normal limits Gait & Station: normal Patient leans: N/A  Psychiatric Specialty Exam: Physical Exam  Nursing note and vitals reviewed. Constitutional: He appears well-developed and well-nourished.  HENT:  Head: Normocephalic and atraumatic.  Eyes: Conjunctivae are normal. Pupils are equal, round, and reactive to light.  Neck: Normal range of motion.  Cardiovascular: Regular rhythm and normal heart sounds.    Respiratory: Effort normal. No respiratory distress.  GI: Soft.  Musculoskeletal: Normal range of motion.  Neurological: He is alert.  Skin: Skin is warm and dry.  Psychiatric: His mood appears anxious. His affect is blunt. His speech is delayed. He is slowed and withdrawn. He expresses impulsivity. He exhibits a depressed mood. He expresses suicidal ideation. He expresses suicidal plans. He exhibits abnormal recent memory.    Review of Systems  Constitutional: Negative.   HENT: Negative.   Eyes: Negative.   Respiratory: Negative.   Cardiovascular: Negative.   Gastrointestinal: Negative.   Musculoskeletal: Negative.   Skin: Negative.   Neurological: Negative.   Psychiatric/Behavioral: Positive for depression and suicidal ideas. Negative for hallucinations, memory loss and substance abuse. The patient is nervous/anxious. The patient does not have insomnia.     Blood pressure (!) 105/54, pulse (!) 59, temperature 97.8 F (36.6 C), temperature source Oral, resp. rate 18, height 6' 2" (1.88 m), weight 94.3 kg (208 lb), SpO2 98 %.Body mass index is 26.71 kg/m.  General Appearance: Disheveled  Eye Contact:  Minimal  Speech:  Slow  Volume:  Decreased  Mood:  Anxious and Dysphoric  Affect:  Blunt  Thought Process:  Disorganized  Orientation:  Full (Time, Place, and Person)  Thought Content:  Illogical, Rumination and Tangential  Suicidal Thoughts:  Yes.  without intent/plan  Homicidal Thoughts:  No  Memory:  Immediate;   Good Recent;   Fair Remote;   Fair  Judgement:  Fair  Insight:  Fair  Psychomotor Activity:  Decreased  Concentration:  Concentration: Fair  Recall:  [employer] of Knowledge:  Fair  Language:  Fair  Akathisia:  No  Handed:  Right  AIMS (if indicated):     Assets:  Desire for Improvement Financial Resources/Insurance Housing Social Support  ADL's:  Intact  Cognition:  Impaired,  Mild  Sleep:        Treatment Plan Summary: Daily contact with patient  to assess and evaluate symptoms and progress in treatment, Medication management and Plan Patient specifically asked me this time if he could come into the hospital. In fact he specifically ask if he could stay here until the weekend and then leave just before the Christmas holiday. I was impressed that he was able to articulate at that clearly even though he doesn't really have the ability to tell me why. It sounds like he is in some distress back home and doesn't feel safe or comfortable going back there before going to his family's home. I agreed to go ahead and admit him to the hospital. No change to medication. When necessary medicine only. Doesn't need to have his Abilify shock yet as far as I can tell. Engage him in groups and activities on the unit and I expect he will probably stabilize pretty quickly.  Disposition: Recommend psychiatric Inpatient admission when medically cleared. Supportive therapy provided about ongoing stressors.  Alethia Berthold, MD 11/02/2016 1:07 PM

## 2016-11-02 NOTE — ED Notes (Signed)
Patient with breakfast served, Patient alert and oriented, states that he slept good, No signs of distress at this time, q 15 minute checks and camera monitoring in progress.

## 2016-11-02 NOTE — ED Notes (Signed)
[facility], He is being transferred to room 307.

## 2016-11-02 NOTE — ED Notes (Signed)
Nurse talked with the Patient and he denies Si/hi or avh, states that he feels anxious, and when he was at home he started to panic and He does not know anything that set it off, except maybe the RacetrackHolidays. Patient states that he goes to a class in [place] from 0900am to 230pm, 5 days a week, he states that he enjoys that, and that He feels safe at his apartment most of the time, and his parents live in [place],and they want him their for Christmas, but he thinks that he needs to stay at home where He feels safe, Patient does have prolonged responses to questions, thought blocking noted, patient talked about drinking a lot of water and that makes him calm. Patient denies any abuse growing up as a child and states that his siblings do not have any emotional issues. q 15 minute checks and camera monitoring in progress.

## 2016-11-02 NOTE — ED Notes (Signed)
Pt is alert and oriented this evening. Pt mood is anxious/sad and he is spending his time in bed watching TV. Pt forwards little but denies SI/HI and AVH. Writer discussed tx plan and pt acknowledges understanding. Food and drink provided and 15 minute checks are ongoing for safety.

## 2016-11-02 NOTE — BH Assessment (Signed)
Assessment Note  Edwin RidgelCameron Fobes is an 30 y.o. male , with a history of schizophrenia, presenting to the ED with concerns of racing thoughts and anxiety. He denied having auditory or visual hallucinations. He denied suicidal or homicidal ideation or intent. He denied the use of alcohol or drugs, although his UDS was positive for marijuana.  He denied facing any additional stressors at this time. He denied a trigger for his racing thoughts. He states that the racing thoughts have been occurring for "the last few years". He has similar presentations to the ED for the same and does not usually require inpatient hospitalizations.    Diagnosis: Schizophrenia  Past Medical History:  Past Medical History:  Diagnosis Date  . Anxiety   . Schizophrenia [facility](HCC)     Past Surgical History:  Procedure Laterality Date  . NO PAST SURGERIES      Family History: No family history on file.  Social History:  reports that he has been smoking Cigarettes.  He has never used smokeless tobacco. He reports that he does not drink alcohol or use drugs.  Additional Social History:  Alcohol / Drug Use History of alcohol / drug use?: No history of alcohol / drug abuse (Pt denies history of drug/alcohol use.)  CIWA: CIWA-Ar BP: 132/78 Pulse Rate: 88 COWS:    Allergies: No Known Allergies  Home Medications:  (Not in a hospital admission)  OB/GYN Status:  No LMP for male patient.  General Assessment Data [facility] Assessment: In system Is this a Tele or Face-to-Face Assessment?: Face-to-Face Is this an Initial Assessment or a Re-assessment for this encounter?: Initial Assessment Marital status: Single Maiden name: n/a Is patient pregnant?: No Pregnancy Status: No Living Arrangements: Alone Can pt return to current living arrangement?: Yes Admission Status: Voluntary Is patient capable of signing voluntary admission?: Yes Referral Source: Self/Family/Friend Insurance type:  Medicare  Medical Screening Exam [facility] Walk-[facility] ONLY) Medical Exam completed: Yes  Crisis Care Plan Living Arrangements: Alone Legal Guardian: Other: (self) Name of Psychiatrist: unknown Name of Therapist: unknown  Education Status Is patient currently in school?: No Current Grade: na Highest grade of school patient has completed: 12 Name of school: na Contact person: na  Risk to self with the past 6 months Suicidal Ideation: No Has patient been a risk to self within the past 6 months prior to admission? : No Suicidal Intent: No Has patient had any suicidal intent within the past 6 months prior to admission? : No Is patient at risk for suicide?: No Suicidal Plan?: No Has patient had any suicidal plan within the past 6 months prior to admission? : No Access to Means: No What has been your use of drugs/alcohol within the last 12 months?: Pt denies drug use Previous Attempts/Gestures: No How many times?: 0 Other Self Harm Risks: None identified Triggers for Past Attempts: None known Intentional Self Injurious Behavior: None Family Suicide History: No Recent stressful life event(s): Other (Comment) Persecutory voices/beliefs?: No Depression: No Substance abuse history and/or treatment for substance abuse?: No Suicide prevention information given to non-admitted patients: Not applicable  Risk to Others within the past 6 months Homicidal Ideation: No Does patient have any lifetime risk of violence toward others beyond the six months prior to admission? : No Thoughts of Harm to Others: No Current Homicidal Intent: No Current Homicidal Plan: No Access to Homicidal Means: No Describe Access to Homicidal Means: None identified Identified Victim: None identified History of harm to others?: No  Assessment of Violence: None Noted Violent Behavior Description: None reported Does patient have access to weapons?: No Criminal Charges Pending?: No Does patient have a court date:  No Is patient on probation?: No  Psychosis Hallucinations: None noted Delusions: None noted  Mental Status Report Appearance/Hygiene: In scrubs Eye Contact: Fair Motor Activity: Freedom of movement, Unremarkable Speech: Logical/coherent, Pressured Level of Consciousness: Drowsy Mood: Anxious Affect: Appropriate to circumstance, Anxious Anxiety Level: Moderate Thought Processes: Relevant, Coherent Judgement: Partial Orientation: Person, Place, Time, Situation Obsessive Compulsive Thoughts/Behaviors: Minimal  Cognitive Functioning Concentration: Normal Memory: Recent Intact, Remote Intact IQ: Average Insight: Fair Impulse Control: Fair Appetite: Good Weight Loss: 0 Weight Gain: 0 Sleep: No Change Total Hours of Sleep: 8 Vegetative Symptoms: None  ADLScreening [facility] Assessment Services) Patient's cognitive ability adequate to safely complete daily activities?: Yes Patient able to express need for assistance with ADLs?: Yes Independently performs ADLs?: Yes (appropriate for developmental age)  Prior Inpatient Therapy Prior Inpatient Therapy: Yes Prior Therapy Dates: 2017, 2014, 2012 , 2009 [facility] [place] [place] Reason for Treatment: Schizophrenia, depression  Prior Outpatient Therapy Prior Outpatient Therapy: Yes Prior Therapy Dates: current Prior Therapy Facilty/Provider(s): [facility] Reason for Treatment: Depression, Schizophrenia Does patient have an ACCT team?: Yes Does patient have Intensive In-House Services?  : No Does patient have [facility] services? : No Does patient have P4CC services?: No  ADL Screening (condition at time of admission) Patient's cognitive ability adequate to safely complete daily activities?: Yes Patient able to express need for assistance with ADLs?: Yes Independently performs ADLs?: Yes (appropriate for developmental age)       Abuse/Neglect Assessment (Assessment to be  complete while patient is alone) Physical Abuse: Denies Verbal Abuse: Denies Sexual Abuse: Denies Exploitation of patient/patient's resources: Denies Self-Neglect: Denies Values / Beliefs Cultural Requests During Hospitalization: None Spiritual Requests During Hospitalization: None Consults Spiritual Care Consult Needed: No Social Work Consult Needed: No Merchant navy officerAdvance Directives (For Healthcare) Does Patient Have a Medical Advance Directive?: No    Additional Information 1:1 In Past 12 Months?: No CIRT Risk: No Elopement Risk: No Does patient have medical clearance?: Yes     Disposition:  Disposition Initial Assessment Completed for this Encounter: Yes Disposition of Patient: Other dispositions Other disposition(s): Other (Comment) (Pending Psych MD consult)  On Site Evaluation by:   Reviewed with Physician:    Rosey Eide C Manju Kulkarni 11/02/2016 1:34 AM

## 2016-11-02 NOTE — BH Assessment (Signed)
[facility] by Dr. Toni Amendlapacs.  Attending Physician will be Dr. Jennet MaduroPucilowska.   Patient [facility] room 307[facility] Victorino DikeJennifer.  Intake Paper Work has been signed and placed on patient chart.  ER staff is aware of the admission Elder Love(E[place], ER Sect.; Dr. Warren LacyVeronse, ER MD; Toniann FailWendy, Patient's Nurse & Byrd HesselbachMaria, Patient Access).

## 2016-11-02 NOTE — ED Notes (Signed)
Patient is resting with eyes closed, no signs of distress, denies Si/hi or avh. Patient with q 15 minute checks and camera monitoring in progress.

## 2016-11-02 NOTE — ED Provider Notes (Signed)
-----------------------------------------   7:38 AM on 11/02/2016 -----------------------------------------   Blood pressure (!) 105/54, pulse (!) 59, temperature 97.8 F (36.6 C), temperature source Oral, resp. rate 18, height 6\' 2"  (1.88 m), weight 208 lb (94.3 kg), SpO2 98 %.  The patient had no acute events since last update.  Calm and cooperative at this time.  Disposition is pending Psychiatry/Behavioral Medicine team recommendations.     Irean HongJade J Melis Trochez, MD 11/02/16 [phone]

## 2016-11-03 ENCOUNTER — Inpatient Hospital Stay
Admission: EM | Admit: 2016-11-03 | Discharge: 2016-11-05 | DRG: 885 | Disposition: A | Discharge status: Home / Self Care | Payer: Medicare Other | Source: Intra-hospital | Attending: Psychiatry | Admitting: Psychiatry

## 2016-11-03 DIAGNOSIS — F17200 Nicotine dependence, unspecified, uncomplicated: Secondary | ICD-10-CM | POA: Diagnosis present

## 2016-11-03 DIAGNOSIS — Z23 Encounter for immunization: Secondary | ICD-10-CM | POA: Diagnosis not present

## 2016-11-03 DIAGNOSIS — Z818 Family history of other mental and behavioral disorders: Secondary | ICD-10-CM

## 2016-11-03 DIAGNOSIS — G4700 Insomnia, unspecified: Secondary | ICD-10-CM | POA: Diagnosis present

## 2016-11-03 DIAGNOSIS — Z91199 Patient's noncompliance with other medical treatment and regimen due to unspecified reason: Secondary | ICD-10-CM

## 2016-11-03 DIAGNOSIS — F203 Undifferentiated schizophrenia: Secondary | ICD-10-CM | POA: Diagnosis present

## 2016-11-03 DIAGNOSIS — F410 Panic disorder [episodic paroxysmal anxiety] without agoraphobia: Secondary | ICD-10-CM | POA: Diagnosis present

## 2016-11-03 DIAGNOSIS — Z79899 Other long term (current) drug therapy: Secondary | ICD-10-CM

## 2016-11-03 DIAGNOSIS — F1220 Cannabis dependence, uncomplicated: Secondary | ICD-10-CM | POA: Diagnosis present

## 2016-11-03 DIAGNOSIS — F17210 Nicotine dependence, cigarettes, uncomplicated: Secondary | ICD-10-CM | POA: Diagnosis present

## 2016-11-03 DIAGNOSIS — Z9119 Patient's noncompliance with other medical treatment and regimen: Secondary | ICD-10-CM

## 2016-11-03 DIAGNOSIS — Z7901 Long term (current) use of anticoagulants: Secondary | ICD-10-CM | POA: Diagnosis not present

## 2016-11-03 LAB — LIPID PANEL
CHOLESTEROL: 214 mg/dL — AB (ref 0–200)
HDL: 58 mg/dL (ref >40)
LDL Cholesterol: 141 mg/dL — ABNORMAL HIGH (ref 0–99)
TRIGLYCERIDES: 76 mg/dL (ref <150)
Total CHOL/HDL Ratio: 3.7 RATIO
VLDL: 15 mg/dL (ref 0–40)

## 2016-11-03 LAB — TSH: TSH: 1.957 u[IU]/mL (ref 0.350–4.500)

## 2016-11-03 MED ORDER — TRAZODONE HCL 100 MG PO TABS
100.0000 mg | ORAL_TABLET | Freq: Every evening | ORAL | Status: DC | PRN
Start: 2016-11-03 — End: 2016-11-04
  Filled 2016-11-03: qty 1

## 2016-11-03 MED ORDER — TEMAZEPAM 15 MG PO CAPS
15.0000 mg | ORAL_CAPSULE | Freq: Every day | ORAL | Status: DC
  Administered 2016-11-03: 15 mg via ORAL
  Filled 2016-11-03: qty 1

## 2016-11-03 MED ORDER — TEMAZEPAM 15 MG PO CAPS: 15.0000 mg | ORAL_CAPSULE | Freq: Every day | ORAL | 0 refills | Status: DC

## 2016-11-03 MED ORDER — ACETAMINOPHEN 325 MG PO TABS: 650.0000 mg | ORAL_TABLET | Freq: Four times a day (QID) | ORAL | Status: DC | PRN

## 2016-11-03 MED ORDER — HYDROXYZINE HCL 25 MG PO TABS: 25.0000 mg | ORAL_TABLET | Freq: Three times a day (TID) | ORAL | Status: DC | PRN

## 2016-11-03 MED ORDER — ALUM & MAG HYDROXIDE-SIMETH 200-200-20 MG/5ML PO SUSP
30.0000 mL | ORAL | Status: DC | PRN
Start: 2016-11-03 — End: 2016-11-05

## 2016-11-03 MED ORDER — NICOTINE 21 MG/24HR TD PT24
21.0000 mg | MEDICATED_PATCH | Freq: Every day | TRANSDERMAL | Status: DC
  Administered 2016-11-03 – 2016-11-05 (×3): 21 mg via TRANSDERMAL
  Filled 2016-11-03 (×3): qty 1

## 2016-11-03 MED ORDER — FLUVOXAMINE MALEATE 50 MG PO TABS
50.0000 mg | ORAL_TABLET | Freq: Every day | ORAL | Status: AC
  Administered 2016-11-03: 50 mg via ORAL
  Filled 2016-11-03: qty 1

## 2016-11-03 MED ORDER — INFLUENZA VAC SPLIT QUAD 0.5 ML IM SUSY
0.5000 mL | PREFILLED_SYRINGE | INTRAMUSCULAR | Status: AC
  Administered 2016-11-04: 0.5 mL via INTRAMUSCULAR
  Filled 2016-11-03: qty 0.5

## 2016-11-03 MED ORDER — FLUVOXAMINE MALEATE 50 MG PO TABS
100.0000 mg | ORAL_TABLET | Freq: Every day | ORAL | Status: DC
  Administered 2016-11-04: 100 mg via ORAL
  Filled 2016-11-03: qty 2

## 2016-11-03 MED ORDER — ARIPIPRAZOLE 10 MG PO TABS
10.0000 mg | ORAL_TABLET | Freq: Every day | ORAL | Status: DC
  Administered 2016-11-03: 10 mg via ORAL
  Filled 2016-11-03: qty 1

## 2016-11-03 MED ORDER — FLUVOXAMINE MALEATE 100 MG PO TABS: 100.0000 mg | ORAL_TABLET | Freq: Every day | ORAL | 1 refills | Status: DC

## 2016-11-03 MED ORDER — ARIPIPRAZOLE 10 MG PO TABS
20.0000 mg | ORAL_TABLET | Freq: Every day | ORAL | Status: DC
  Administered 2016-11-04 – 2016-11-05 (×2): 20 mg via ORAL
  Filled 2016-11-03 (×3): qty 2

## 2016-11-03 MED ORDER — ARIPIPRAZOLE ER 400 MG IM SRER
400.0000 mg | INTRAMUSCULAR | Status: DC
  Administered 2016-11-03: 400 mg via INTRAMUSCULAR
  Filled 2016-11-03: qty 400

## 2016-11-03 MED ORDER — MAGNESIUM HYDROXIDE 400 MG/5ML PO SUSP: 30.0000 mL | Freq: Every day | ORAL | Status: DC | PRN

## 2016-11-03 MED ORDER — ARIPIPRAZOLE ER 400 MG IM SRER: 400.0000 mg | INTRAMUSCULAR | Status: DC

## 2016-11-03 MED ORDER — ARIPIPRAZOLE 20 MG PO TABS: 20.0000 mg | ORAL_TABLET | Freq: Every day | ORAL | 0 refills | Status: DC

## 2016-11-03 NOTE — Progress Notes (Signed)
Recreation Therapy Notes  Date: 12.22.17 Time: 9:30 am Location: Craft Room  Group Topic: Coping Skills  Goal Area(s) Addresses:  Patient will participate in coping skill activity. Patient will verbalize benefit of using art as a coping skill.  Behavioral Response: Attentive  Intervention: Coloring  Activity: Patients were given coloring sheets and were instructed to think about their emotions and what their minds were focused on while they were coloring.  Education: LRT educated patients on healthy coping skills.  Education Outcome: In group clarification offered   Clinical Observations/Feedback: Patient colored coloring sheet. Patient did not contribute to group discussion.  Jacquelynn CreeGreene,Ellasyn Swilling M, LRT/CTRS 11/03/2016 10:23 AM

## 2016-11-03 NOTE — [facility] Suicide Risk Assessment (Signed)
[facility]   Nursing information obtained from:  Patient Demographic factors:  Male, Caucasian, Unemployed Current Mental Status:  NA Loss Factors:  NA Historical Factors:  NA Risk Reduction Factors:  Positive social support  Total Time spent with patient: 1 hour Principal Problem: Schizophrenia, undifferentiated (HCC) Diagnosis:   Patient Active Problem List   Diagnosis Date Noted  . Schizophrenia, undifferentiated (HCC) [F20.3] 11/03/2016  . 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 Panic attack [F41.0] 06/23/2016  . Noncompliance [Z91.19] 02/29/2016  . Tobacco use disorder [F17.200] 01/27/2016   Subjective Data: racing thoughts.  Continued Clinical Symptoms:  Alcohol Use Disorder Identification Test Final Score (AUDIT): 0 The "Alcohol Use Disorders Identification Test", Guidelines for Use in Primary Care, Second Edition.  World Science writerHealth Organization [facility](WHO). Score between 0-7:  no or low risk or alcohol related problems. Score between 8-15:  moderate risk of alcohol related problems. Score between 16-19:  high risk of alcohol related problems. Score 20 or above:  warrants further diagnostic evaluation for alcohol dependence and treatment.   CLINICAL FACTORS:   Severe Anxiety and/or Agitation Schizophrenia:   Less than 28 years old Paranoid or undifferentiated type   Musculoskeletal: Strength & Muscle Tone: within normal limits Gait & Station: normal Patient leans: N/A  Psychiatric Specialty Exam: Physical Exam  Nursing note and vitals reviewed.   Review of Systems  Psychiatric/Behavioral: The patient is nervous/anxious.   All other systems reviewed and are negative.   Blood pressure 116/65, pulse (!) 50, temperature 98 F (36.7 C), resp. rate 18, height 6\' 2"  (1.88 m), weight 94.3 kg (208 lb), SpO2 100 %.Body mass index is 26.71 kg/m.  General Appearance: Fairly Groomed  Eye Contact:  Fair  Speech:  Clear  and Coherent  Volume:  Normal  Mood:  Anxious, Hopeless and Worthless  Affect:  Inappropriate  Thought Process:  Goal Directed and Descriptions of Associations: Intact  Orientation:  Full (Time, Place, and Person)  Thought Content:  WDL  Suicidal Thoughts:  No  Homicidal Thoughts:  No  Memory:  Immediate;   Fair Recent;   Fair Remote;   Fair  Judgement:  Impaired  Insight:  Shallow  Psychomotor Activity:  Normal  Concentration:  Concentration: Fair and Attention Span: Fair  Recall:  [employer]Fair  Fund of Knowledge:  Fair  Language:  Fair  Akathisia:  No  Handed:  Right  AIMS (if indicated):     Assets:  Communication Skills Desire for Improvement Financial Resources/Insurance Housing Physical Health Resilience Social Support  ADL's:  Intact    Sleep:  Number of Hours: 3.5      COGNITIVE FEATURES THAT CONTRIBUTE TO RISK:  None    SUICIDE RISK:   Minimal: No identifiable suicidal ideation.  Patients presenting with no risk factors but with morbid ruminations; may be classified as minimal risk based on the severity of the depressive symptoms   PLAN OF CARE: Hospital admission, medication management, discharge planning.  Edwin Montes is a 30-yar-old male with a history of schizophrenia admitted for heightened anxiety and racing thoughts.  1. Psychosis. The patient was restarted on oral Abilify. He received Abilify maintena injection on 11/21 so will give another injection.   2. Anxiety. He is on Vistaril as needed.   3. Insomnia. He slept 3 hours only with Trazodone. I will give Restoril here.   4. Smoking. Nicotine patch is available.  5. Disposition. He will be discharged back to his apartment. He will follow  up with Dr. Georjean ModeLitz and [facility].    I certify that inpatient services furnished can reasonably be expected to improve the patient's condition.  Kristine LineaJolanta Najmo Pardue, MD 11/03/2016, 9:47 AMlast

## 2016-11-03 NOTE — [facility] Group Notes (Signed)
[facility] LCSW Group Therapy Note  Date/Time: 11/03/16 1300  Type of Therapy and Topic:  Group Therapy:  Feelings around Relapse and Recovery  Participation Level:  Minimal   Mood: cooperative  Description of Group:    Patients in this group will discuss emotions they experience before and after a relapse. They will process how experiencing these feelings, or avoidance of experiencing them, relates to having a relapse. Facilitator will guide patients to explore emotions they have related to recovery. Patients will be encouraged to process which emotions are more powerful. They will be guided to discuss the emotional reaction significant others in their lives may have to patients' relapse or recovery. Patients will be assisted in exploring ways to respond to the emotions of others without this contributing to a relapse.  Therapeutic Goals: 1. Patient will identify two or more emotions that lead to relapse for them:  2. Patient will identify two emotions that result when they relapse:  3. Patient will identify two emotions related to recovery:  4. Patient will demonstrate ability to communicate their needs through discussion and/or role plays.   Summary of Patient Progress: Pt attempted to participate in the group discussion but had difficulty organizing his thoughts when given the opportunity to contribute to the discussion.  Pt remained quite after this attempt until the end of the group when he did speak briefly to CSW as he left the room.   Therapeutic Modalities:   Cognitive Behavioral Therapy Solution-Focused Therapy Assertiveness Training Relapse Prevention Therapy  Daleen SquibbGreg Lori Popowski, LCSW

## 2016-11-03 NOTE — H&P (Signed)
Psychiatric Admission Assessment Adult  Patient Identification: Edwin Montes MRN:  [id number] Date of Evaluation:  11/03/2016 Chief Complaint:  Schizophrenia Principal Diagnosis: Schizophrenia, undifferentiated (HCC) Diagnosis:   Patient Active Problem List   Diagnosis Date Noted  . Schizophrenia, undifferentiated (HCC) [F20.3] 11/03/2016  . 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 Panic attack [F41.0] 06/23/2016  . Noncompliance [Z91.19] 02/29/2016  . Tobacco use disorder [F17.200] 01/27/2016   History of Present Illness:   Identifying data. [name] is a 30 year old male with history of schizophrenia.  Chief complaint. "I have racing thoughts again."  History of present illness. Information was obtained from the patient and the chart. The patient has a long history of schizophrenia diagnosed about 10 years ago. He has been doing exceedingly well on Abilify maintena injections and graduated from the group home into his own supervised apartment almost one year ago. [facility] and works with peer support team. He however missed his Edwin Montes injection that according to my calculation should be on 10/31/2016, last recorded injection was on 11/21. For several days now, the patient has been feeling increasingly anxious, with racing disorganized thoughts and insomnia. He admits that going home for Christmas is stressful as he can not stand comparing his situation to other family members. He has been even thinking about giving up independent living and going back to a group home. Apparently, his neighbors take advantage of him using his phone and borrowing money from him. He denies suicidal ideation, intentions or plans but feels confused and uncertain. He also reports auditory hallucinations but no commands. He has been smirking as if responding to internal stimuli during the interview. He does not report paranoia. His anxiety has increased.  He reports symptoms of generalized anxiety disorder, infrequent panic attacks and some symptoms suggestive of OCD with excessive worries, excessive cleaning and organizing. He denies PTSD symptoms. He believes that he might be depressed as well lately. He denies alcohol or illicit drugs or prescription pill abuse.  Past psychiatric history. The patient is not a good historian and oftentimes gives me conflicting information. Apparently he was diagnosed at the age of 30 and was placed in a group home at that time. He has been a resident of multiple group homes up until recently when he "graduated" and was allowed to live in his own apartment. He reports being hospitalized many times in the past. He denies ever attempting suicide. He cannot explain why he was in the hospital. He believes that he was given diagnosis of bipolar, schizoaffective disorder, schizophrenia, and anxiety. He has been tried on multiple medications including Risperdal, Invega, Zyprexa, Latuda, and Saphris. He does not remember ever being on Seroquel or Geodon. He was tried on Depakote but not tolerated. He has never taken lithium or Tegretol. In the past he was treated with SSRIs for anxiety. He was given Klonopin in the past.  Family psychiatric history. Grandmother with depression.  Social history. [place] He graduated from high school. He denies any history of abuse. He has been a resident of multiple group homes. He is now living in a supervised apartment in [place]. [facility] and Dr. Lacie Montes as his primary doctor. [facility] and works with peer support team.  Total Time spent with patient: 1 hour  Is the patient at risk to self? No.  Has the patient  been a risk to self in the past 6 months? No.  Has the patient been a risk to self within the distant past? No.  Is the patient a risk to others? No.  Has the patient been a  risk to others in the past 6 months? No.  Has the patient been a risk to others within the distant past? No.   Prior Inpatient Therapy:   Prior Outpatient Therapy:    Alcohol Screening: 1. How often do you have a drink containing alcohol?: Never 2. How many drinks containing alcohol do you have on a typical day when you are drinking?: 1 or 2 3. How often do you have six or more drinks on one occasion?: Never Preliminary Score: 0 9. Have you or someone else been injured as a result of your drinking?: No 10. Has a relative or friend or a doctor or another health worker been concerned about your drinking or suggested you cut down?: No Alcohol Use Disorder Identification Test Final Score (AUDIT): 0 Brief Intervention: AUDIT score less than 7 or less-screening does not suggest unhealthy drinking-brief intervention not indicated Substance Abuse History in the last 12 months:  Yes.   Consequences of Substance Abuse: Negative Previous Psychotropic Medications: Yes  Psychological Evaluations: No  Past Medical History:  Past Medical History:  Diagnosis Date  . Anxiety   . Schizophrenia [facility])     Past Surgical History:  Procedure Laterality Date  . NO PAST SURGERIES     Family History: History reviewed. No pertinent family history.  Tobacco Screening: Have you used any form of tobacco in the last 30 days? (Cigarettes, Smokeless Tobacco, Cigars, and/or Pipes): Yes Tobacco use, Select all that apply: 4 or less cigarettes per day Are you interested in Tobacco Cessation Medications?: Yes, will notify MD for an order Counseled patient on smoking cessation including recognizing danger situations, developing coping skills and basic information about quitting provided: Refused/Declined practical counseling Social History:  History  Alcohol Use No     History  Drug Use No    Additional Social History:                           Allergies:  No Known Allergies Lab Results:  Results  for orders placed or performed during the hospital encounter of 11/03/16 (from the past 48 hour(s))  Lipid panel     Status: Abnormal   Collection Time: 11/03/16  7:16 AM  Result Value Ref Range   Cholesterol 214 (H) 0 - 200 mg/dL   Triglycerides 76 <161 mg/dL   HDL 58 >09 mg/dL   Total CHOL/HDL Ratio 3.7 RATIO   VLDL 15 0 - 40 mg/dL   LDL Cholesterol 604 (H) 0 - 99 mg/dL    Comment:        Total Cholesterol/HDL:CHD Risk Coronary Heart Disease Risk Table                     Men   Women  1/2 Average Risk   3.4   3.3  Average Risk       5.0   4.4  2 X Average Risk   9.6   7.1  3 X Average Risk  23.4   11.0        Use the calculated Patient Ratio above and the CHD Risk Table to determine the patient's CHD Risk.        ATP III CLASSIFICATION (LDL):  <100  mg/dL   Optimal  [id number]  mg/dL   Near or Above                    Optimal  130-159  mg/dL   Borderline  [id number]  mg/dL   High  >956>190     mg/dL   Very High   TSH     Status: None   Collection Time: 11/03/16  7:16 AM  Result Value Ref Range   TSH 1.957 0.350 - 4.500 uIU/mL    Comment: Performed by a 3rd Generation assay with a functional sensitivity of <=0.01 uIU/mL.    Blood Alcohol level:  Lab Results  Component Value Date   [facility] <5 11/01/2016   ETH <5 09/03/2016    Metabolic Disorder Labs:  Lab Results  Component Value Date   HGBA1C 4.8 01/27/2016   Lab Results  Component Value Date   PROLACTIN 1.7 (L) 01/27/2016   Lab Results  Component Value Date   CHOL 214 (H) 11/03/2016   TRIG 76 11/03/2016   HDL 58 11/03/2016   CHOLHDL 3.7 11/03/2016   VLDL 15 11/03/2016   LDLCALC 141 (H) 11/03/2016   LDLCALC 100 (H) 01/27/2016    Current Medications: Current Facility-Administered Medications  Medication Dose Route Frequency Provider Last Rate Last Dose  . acetaminophen (TYLENOL) tablet 650 mg  650 mg Oral Q6H PRN Audery AmelJohn T Clapacs, MD      . alum & mag hydroxide-simeth (MAALOX/MYLANTA) 200-200-20 MG/5ML  suspension 30 mL  30 mL Oral Q4H PRN Audery AmelJohn T Clapacs, MD      . ARIPiprazole (ABILIFY) tablet 10 mg  10 mg Oral Daily Jolanta B Pucilowska, MD   10 mg at 11/03/16 0930  . [START ON 11/17/2016] ARIPiprazole ER SRER 400 mg  400 mg Intramuscular Q28 days Jolanta B Pucilowska, MD      . hydrOXYzine (ATARAX/VISTARIL) tablet 25 mg  25 mg Oral TID PRN Audery AmelJohn T Clapacs, MD      . Melene Muller[START ON 11/04/2016] Influenza vac split quadrivalent PF (FLUARIX) injection 0.5 mL  0.5 mL Intramuscular Tomorrow-1000 Jolanta B Pucilowska, MD      . magnesium hydroxide (MILK OF MAGNESIA) suspension 30 mL  30 mL Oral Daily PRN Audery AmelJohn T Clapacs, MD      . nicotine (NICODERM CQ - dosed in mg/24 hours) patch 21 mg  21 mg Transdermal Daily Jolanta B Pucilowska, MD   21 mg at 11/03/16 0930  . traZODone (DESYREL) tablet 100 mg  100 mg Oral QHS PRN Audery AmelJohn T Clapacs, MD       PTA Medications: Prescriptions Prior to Admission  Medication Sig Dispense Refill Last Dose  . ARIPiprazole (ABILIFY) 5 MG tablet Take 5 mg by mouth daily.   unknown at unknown  . ARIPiprazole 400 MG SUSR Inject 400 mg into the muscle every 28 (twenty-eight) days. 1 each 1 unknown at unknown  . hydrOXYzine (VISTARIL) 25 MG capsule Take 1 capsule (25 mg total) by mouth every 8 (eight) hours as needed for anxiety. 20 capsule 0 prn at prn  . LORazepam (ATIVAN) 1 MG tablet Take 1 tablet (1 mg total) by mouth 2 (two) times daily as needed for anxiety. 10 tablet 0     Musculoskeletal: Strength & Muscle Tone: within normal limits Gait & Station: normal Patient leans: N/A  Psychiatric Specialty Exam: I reviewed physical exam performed in the ER and agree with the findings. Physical Exam  Nursing note and vitals reviewed.   Review of Systems  Psychiatric/Behavioral: The patient is nervous/anxious.   All other systems reviewed and are negative.   Blood pressure 116/65, pulse (!) 50, temperature 98 F (36.7 C), resp. rate 18, height 6\' 2"  (1.88 m), weight 94.3 kg (208  lb), SpO2 100 %.Body mass index is 26.71 kg/m.  See SRA.                                                  Sleep:  Number of Hours: 3.5    Treatment Plan Summary: Daily contact with patient to assess and evaluate symptoms and progress in treatment and Medication management   Mr. Goudeau is a 30-yar-old male with a history of schizophrenia admitted for heightened anxiety and racing thoughts.  1. Psychosis. The patient was restarted on oral Abilify. He received Abilify maintena injection on 11/21 so will give another injection today on 12/22.   2. Mood and Anxiety. He is on Vistaril as needed. I wil give Luvox.   3. Insomnia. He slept 3 hours only with Trazodone. I will give Restoril here.   4. Smoking. Nicotine patch is available.  5. Disposition. He wants to be discharge on Sunday 12/24 so his mother could take him home to [facility] for the holidays. He will follow up with Dr. Georjean Mode and peer support team at [facility].     Observation Level/Precautions:  15 minute checks  Laboratory:  CBC Chemistry Profile UDS UA  Psychotherapy:    Medications:    Consultations:    Discharge Concerns:    Estimated LOS:  Other:     Physician Treatment Plan for Primary Diagnosis: Schizophrenia, undifferentiated (HCC) Long Term Goal(s): Improvement in symptoms so as ready for discharge  Short Term Goals: Ability to identify changes in lifestyle to reduce recurrence of condition will improve, Ability to verbalize feelings will improve, Ability to disclose and discuss suicidal ideas, Ability to demonstrate self-control will improve, Ability to identify and develop effective coping behaviors will improve, Ability to maintain clinical measurements within normal limits will improve, Compliance with prescribed medications will improve and Ability to identify triggers associated with substance abuse/mental health issues will improve  Physician Treatment Plan for Secondary Diagnosis:  Principal Problem:   Schizophrenia, undifferentiated (HCC) Active Problems:   Tobacco use disorder   Noncompliance   Panic attack   Cannabis use disorder, moderate, dependence (HCC)  Long Term Goal(s): Improvement in symptoms so as ready for discharge  Short Term Goals: Ability to identify changes in lifestyle to reduce recurrence of condition will improve, Ability to demonstrate self-control will improve and Ability to identify triggers associated with substance abuse/mental health issues will improve  I certify that inpatient services furnished can reasonably be expected to improve the patient's condition.    Kristine Linea, MD 12/22/20179:57 AM

## 2016-11-03 NOTE — Progress Notes (Signed)
CH responded to OR for AD. Pt was alert but had given it more thought and was not interested. CH asked if he would like to be educated. Pt Agreed. After Education, Pt was appreciative of the visit, but was not interested in AD. CH is available for follow up as needed.    11/03/16 1000  Clinical Encounter Type  Visited With Patient  Visit Type Initial;Spiritual support;Behavioral Health  Referral From Nurse  Spiritual Encounters  Spiritual Needs Literature

## 2016-11-03 NOTE — Progress Notes (Addendum)
[facility] MD Progress Note  11/03/2016 2:52 PM Edwin Montes  MRN:  [id number] Subjective:  Identifying data. [name] is a 30 year old male with history of schizophrenia. The patient has a long history of schizophrenia diagnosed about 10 years ago. He has been doing exceedingly well on Abilify maintena injections and graduated from the group home into his own supervised apartment almost one year ago. He sees Dr. Georjean Mode at [facility] regularly and works with peer support team. He however missed his Roderic Palau injection that according to my calculation should be on 10/31/2016, last recorded injection was on 11/21. For several days now, the patient has been feeling increasingly anxious, with racing disorganized thoughts and insomnia.   12/23 patient was found sleeping late in the morning. He was short and vague in his answers. He tells me he is doing okay. He feels that the medication is working well since the dose was increased. He denies having any side effects or physical complaints. He denies problems with mood, appetite, energy, sleep or concentration. He denies auditory or visual hallucinations. He denies suicidality or homicidality.  Per nursing staff the patient has had good oral intake and has been compliant with his medications. Patient only slept 3 hours last night  Per nursing Patient is cooperative. He remains flat on approach. He denies suicidal or homicidal ideations and AV hallucinations.Appropriate with staff & peers.Attended groups.Compliant with medications..Support & encouragement given.  Principal Problem: Schizophrenia, undifferentiated (HCC) Diagnosis:   Patient Active Problem List   Diagnosis Date Noted  . Schizophrenia, undifferentiated (HCC) [F20.3] 11/03/2016  . 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 Panic attack [F41.0] 06/23/2016  . Noncompliance [Z91.19] 02/29/2016  . Tobacco use disorder [F17.200] 01/27/2016   Total Time spent with patient: 30  minutes  Past Psychiatric History: The patient is not a good historian and oftentimes gives me conflicting information. Apparently he was diagnosed at the age of 52 and was placed in a group home at that time. He has been a resident of multiple group homes up until recently when he "graduated" and was allowed to live in his own apartment. He reports being hospitalized many times in the past. He denies ever attempting suicide. He cannot explain why he was in the hospital. He believes that he was given diagnosis of bipolar, schizoaffective disorder, schizophrenia, and anxiety. He has been tried on multiple medications including Risperdal, Invega, Zyprexa, Latuda, and Saphris. He does not remember ever being on Seroquel or Geodon. He was tried on Depakote but not tolerated. He has never taken lithium or Tegretol. In the past he was treated with SSRIs for anxiety. He was given Klonopin in the past.  Past Medical History:  Past Medical History:  Diagnosis Date  . Anxiety   . Schizophrenia [facility])     Past Surgical History:  Procedure Laterality Date  . NO PAST SURGERIES     Family History: History reviewed. No pertinent family history.   Family Psychiatric  History: Grandmother with depression.  Social History: He grew up in [place], [place]. He graduated from high school. He denies any history of abuse. He has been a resident of multiple group homes. He is now living in a supervised apartment in [place]. He sees Dr. Georjean Mode at [facility] for mental health and Dr. Lacie Scotts as his primary doctor. He goes to a day program at the [employer] and works with peer support team. History  Alcohol Use No     History  Drug Use No  Social History   Social History  . Marital status: Single    Spouse name: N/A  . Number of children: N/A  . Years of education: N/A   Social History Main Topics  . Smoking status: Current Some Day Smoker    Types: Cigarettes  . Smokeless tobacco: Never Used  .  Alcohol use No  . Drug use: No  . Sexual activity: No   Other Topics Concern  . None   Social History Narrative  . None    Current Medications: Current Facility-Administered Medications  Medication Dose Route Frequency Provider Last Rate Last Dose  . acetaminophen (TYLENOL) tablet 650 mg  650 mg Oral Q6H PRN Audery AmelJohn T Clapacs, MD      . alum & mag hydroxide-simeth (MAALOX/MYLANTA) 200-200-20 MG/5ML suspension 30 mL  30 mL Oral Q4H PRN Audery AmelJohn T Clapacs, MD      . Melene Muller[START ON 11/04/2016] ARIPiprazole (ABILIFY) tablet 20 mg  20 mg Oral Daily Jolanta B Pucilowska, MD      . ARIPiprazole ER SRER 400 mg  400 mg Intramuscular Q28 days Shari ProwsJolanta B Pucilowska, MD   400 mg at 11/03/16 1338  . [START ON 11/04/2016] fluvoxaMINE (LUVOX) tablet 100 mg  100 mg Oral QHS Jolanta B Pucilowska, MD      . fluvoxaMINE (LUVOX) tablet 50 mg  50 mg Oral QHS Jolanta B Pucilowska, MD      . hydrOXYzine (ATARAX/VISTARIL) tablet 25 mg  25 mg Oral TID PRN Audery AmelJohn T Clapacs, MD      . Melene Muller[START ON 11/04/2016] Influenza vac split quadrivalent PF (FLUARIX) injection 0.5 mL  0.5 mL Intramuscular Tomorrow-1000 Jolanta B Pucilowska, MD      . magnesium hydroxide (MILK OF MAGNESIA) suspension 30 mL  30 mL Oral Daily PRN Audery AmelJohn T Clapacs, MD      . nicotine (NICODERM CQ - dosed in mg/24 hours) patch 21 mg  21 mg Transdermal Daily Jolanta B Pucilowska, MD   21 mg at 11/03/16 0930  . temazepam (RESTORIL) capsule 15 mg  15 mg Oral QHS Jolanta B Pucilowska, MD      . traZODone (DESYREL) tablet 100 mg  100 mg Oral QHS PRN Audery AmelJohn T Clapacs, MD        Lab Results:  Results for orders placed or performed during the hospital encounter of 11/03/16 (from the past 48 hour(s))  Lipid panel     Status: Abnormal   Collection Time: 11/03/16  7:16 AM  Result Value Ref Range   Cholesterol 214 (H) 0 - 200 mg/dL   Triglycerides 76 <132<150 mg/dL   HDL 58 >44>40 mg/dL   Total CHOL/HDL Ratio 3.7 RATIO   VLDL 15 0 - 40 mg/dL   LDL Cholesterol 010141 (H) 0 - 99 mg/dL     Comment:        Total Cholesterol/HDL:CHD Risk Coronary Heart Disease Risk Table                     Men   Women  1/2 Average Risk   3.4   3.3  Average Risk       5.0   4.4  2 X Average Risk   9.6   7.1  3 X Average Risk  23.4   11.0        Use the calculated Patient Ratio above and the CHD Risk Table to determine the patient's CHD Risk.        ATP III CLASSIFICATION (LDL):  <100  mg/dL   Optimal  161-096  mg/dL   Near or Above                    Optimal  130-159  mg/dL   Borderline  045-409  mg/dL   High  >811     mg/dL   Very High   TSH     Status: None   Collection Time: 11/03/16  7:16 AM  Result Value Ref Range   TSH 1.957 0.350 - 4.500 uIU/mL    Comment: Performed by a 3rd Generation assay with a functional sensitivity of <=0.01 uIU/mL.    Blood Alcohol level:  Lab Results  Component Value Date   [facility] <5 11/01/2016   ETH <5 09/03/2016    Metabolic Disorder Labs: Lab Results  Component Value Date   HGBA1C 4.8 01/27/2016   Lab Results  Component Value Date   PROLACTIN 1.7 (L) 01/27/2016   Lab Results  Component Value Date   CHOL 214 (H) 11/03/2016   TRIG 76 11/03/2016   HDL 58 11/03/2016   CHOLHDL 3.7 11/03/2016   VLDL 15 11/03/2016   LDLCALC 141 (H) 11/03/2016   LDLCALC 100 (H) 01/27/2016    Physical Findings: AIMS:  , ,  ,  ,    CIWA:    COWS:     Musculoskeletal: Strength & Muscle Tone: within normal limits Gait & Station: normal Patient leans: N/A  Psychiatric Specialty Exam: Physical Exam  Constitutional: He is oriented to person, place, and time. He appears well-developed and well-nourished.  HENT:  Head: Normocephalic and atraumatic.  Eyes: Conjunctivae and EOM are normal.  Neck: Normal range of motion.  Respiratory: Effort normal.  Musculoskeletal: Normal range of motion.  Neurological: He is alert and oriented to person, place, and time.    Review of Systems  Constitutional: Negative.   HENT: Negative.   Eyes: Negative.    Respiratory: Negative.   Cardiovascular: Negative.   Gastrointestinal: Negative.   Genitourinary: Negative.   Musculoskeletal: Negative.   Skin: Negative.   Neurological: Negative.   Endo/Heme/Allergies: Negative.   Psychiatric/Behavioral: Negative.     Blood pressure 116/65, pulse (!) 50, temperature 98 F (36.7 C), resp. rate 18, height 6\' 2"  (1.88 m), weight 94.3 kg (208 lb), SpO2 100 %.Body mass index is 26.71 kg/m.  General Appearance: Disheveled  Eye Contact:  Minimal  Speech:  Normal Rate  Volume:  Normal  Mood:  Euthymic  Affect:  Constricted  Thought Process:  Linear and Descriptions of Associations: Intact  Orientation:  Full (Time, Place, and Person)  Thought Content:  Hallucinations: None  Suicidal Thoughts:  No  Homicidal Thoughts:  No  Memory:  Immediate;   Fair Recent;   Fair Remote;   Fair  Judgement:  Poor  Insight:  Shallow  Psychomotor Activity:  Decreased  Concentration:  Concentration: Fair and Attention Span: Fair  Recall:  Good  Fund of Knowledge:  Fair  Language:  Good  Akathisia:  No  Handed:    AIMS (if indicated):     Assets:  Communication Skills Physical Health  ADL's:  Intact  Cognition:  WNL  Sleep:  Number of Hours: 3.5     Treatment Plan Summary:  Mr. Wildeman is a 30-yar-old male with a history of schizophrenia admitted for heightened anxiety and racing thoughts.  1. Psychosis. The patient was restarted on oral Abilify 20 mg. He received Abilify maintena injection on 11/21. He received another injection  on 12/22.  2. Mood and Anxiety. He is on Vistaril as needed. Continue luvox 100 mg.   3. Insomnia: Patient only slept 3 hours last night with Restoril, denied before he also slept 3 hours. I will discontinue Restoril and trazodone and start Ativan 2 mg daily at bedtime  4. Smoking. Nicotine patch is available.  5. Disposition. He wants to be discharge on Sunday 12/24 so his [place]. He will follow up with Dr. Georjean ModeLitz and [facility].     Jimmy FootmanHernandez-Gonzalez,  Kairie Vangieson, MD 11/03/2016, 2:52 PM

## 2016-11-03 NOTE — Progress Notes (Signed)
Patient ID: [name], male   DOB: Dec 06, 1985, 30 y.o.   MRN: [id number] Skin assessment and contrabands search completed; gross skin intact, no tattoo, no open wound; no contrabands found on patient and his belongings.

## 2016-11-03 NOTE — [facility] Suicide Risk Assessment (Signed)
[facility] INPATIENT:  Family/Significant Other Suicide Prevention Education  Suicide Prevention Education:  Education Completed;Edwin Montes (mother [phone]), has been identified by the patient as the family member/significant other with whom the patient will be residing, and identified as the person(s) who will aid the patient in the event of a mental health crisis (suicidal ideations/suicide attempt).  With written consent from the patient, the family member/significant other has been provided the following suicide prevention education, prior to the and/or following the discharge of the patient.  The suicide prevention education provided includes the following:  Suicide risk factors  Suicide prevention and interventions  National Suicide Hotline telephone number  [facility] assessment telephone number  [facility] Emergency Assistance 911  [facility] and/or Residential Mobile Crisis Unit telephone number  Request made of family/significant other to:  Remove weapons (e.g., guns, rifles, knives), all items previously/currently identified as safety concern.    Remove drugs/medications (over-the-counter, prescriptions, illicit drugs), all items previously/currently identified as a safety concern.  The family member/significant other verbalizes understanding of the suicide prevention education information provided.  The family member/significant other agrees to remove the items of safety concern listed above.  Brita Jurgensen G. Garnette CzechSampson MSW, LCSWA 11/03/2016, 12:05 PM

## 2016-11-03 NOTE — [facility] Counselor (Signed)
Adult Comprehensive Assessment  Patient ID: Edwin Montes, male   DOB: 03-Jan-1986, 30 y.o.   MRN: [id number]  Information Source: Information source: Patient  Current Stressors:  Educational / Learning stressors: n/a Employment / Job issues: Pt is unemployed but has disability Family Relationships: n/a Surveyor, quantityinancial / Lack of resources (include bankruptcy): n/a Housing / Lack of housing: n/a Physical health (include injuries & life threatening diseases): n/a Social relationships: n/a Substance abuse: n/a Bereavement / Loss: n/a  Living/Environment/Situation:  Living Arrangements: Alone Living conditions (as described by patient or guardian): Pt states that it is fine.  How long has patient lived in current situation?: 12 months What is atmosphere in current home: Comfortable  Family History:  Marital status: Single Are you sexually active?: No What is your sexual orientation?: Heterosexual  Has your sexual activity been affected by drugs, alcohol, medication, or emotional stress?: None reported  Does patient have children?: No  Childhood History:  By whom was/is the patient raised?: Both parents Additional childhood history information: n/a Description of patient's relationship with caregiver when they were a child: Good relationship with parents  Patient's description of current relationship with people who raised him/her: Pt states he close to his parents How were you disciplined when you got in trouble as a child/adolescent?: None reported  Does patient have siblings?: Yes Number of Siblings: 1 Description of patient's current relationship with siblings: 1 sister, close relationship  Did patient suffer any verbal/emotional/physical/sexual abuse as a child?: No Did patient suffer from severe childhood neglect?: No Has patient ever been sexually abused/assaulted/raped as an adolescent or adult?: No Was the patient ever a victim of a crime or a disaster?: No Witnessed domestic  violence?: No Has patient been effected by domestic violence as an adult?: No  Education:  Highest grade of school patient has completed: 12 Currently a student?: No Name of school: na Learning disability?: No  Employment/Work Situation:   Employment situation: On disability Why is patient on disability: Mental health  How long has patient been on disability: about 3 years Patient's job has been impacted by current illness: No What is the longest time patient has a held a job?: 3 years  Where was the patient employed at that time?: [employer]  Has patient ever been in the Eli Lilly and Companymilitary?: No Has patient ever served in combat?: No Did You Receive Any Psychiatric Treatment/Services While in Equities traderthe Military?: No Are There Guns or Education officer, communityther Weapons in Your Home?: No Are These ComptrollerWeapons Safely Secured?:  (n/a)  Financial Resources:   Surveyor, quantityinancial resources: [employer]ce[employer] SSI, Medicaid, [employer], Support from parents / caregiver Does patient have a Lawyerrepresentative payee or guardian?: Yes Name of representative payee or guardian: Mother Edwin Montes is his payee  Alcohol/Substance Abuse:   What has been your use of drugs/alcohol within the last 12 months?: n/a If attempted suicide, did drugs/alcohol play a role in this?: No Alcohol/Substance Abuse Treatment Hx: Denies past history Has alcohol/substance abuse ever caused legal problems?: No  Social Support System:   Patient's Community Support System: Good Describe Community Support System: Pt has support from his family. Type of faith/religion: n/a How does patient's faith help to cope with current illness?: n/a  Leisure/Recreation:   Leisure and Hobbies: Sports, playing music   Strengths/Needs:   What things does the patient do well?: kind, good at communicating In what areas does patient struggle / problems for patient: depression, anxiety  Discharge Plan:   Does patient have access to transportation?: Yes (mother Edwin Montes) Will  patient be  returning to same living situation after discharge?: Yes Currently receiving community mental health services: Yes (From Whom) ([facility]) Does patient have financial barriers related to discharge medications?: No  Summary/Recommendations:   Patient is a 30 year old male admitted voluntarily with a diagnosis of Schizophrenia, undifferentiated. Information was obtained from psychosocial assessment completed with patient and chart review conducted by this evaluator. Patient presented to the hospital with concerns of racing thoughts and anxiety. Patient denied any primary triggers for admission but states he wants to talk to his mother about moving back to a group home. Patient is currently living in supervised independent living arrangement and has outpatient services and community support team from [facility] [place]. Patient has support from his family and his mother is his payee. Patient will benefit from crisis stabilization, medication evaluation, group therapy and psycho education in addition to case management for discharge. At discharge, it is recommended that patient remain compliant with established discharge plan and continued treatment.   Mersadie Kavanaugh G. Garnette CzechSampson MSW, [facility] 11/03/2016 11:54 AM

## 2016-11-03 NOTE — Progress Notes (Signed)
Patient is pleasant & cooperative in the unit.Denies suicidal or homicidal ideations and AV hallucinations.Appropriate with staff & peers.Attended groups.Compliant with medications.Appetite & energy level good.Support & encouragement given.

## 2016-11-03 NOTE — Tx Team (Signed)
Initial Treatment Plan 11/03/2016 1:04 AM Edwin Montes WGN:[id number]    PATIENT STRESSORS: Marital or family conflict   PATIENT STRENGTHS: Capable of independent living General fund of knowledge   PATIENT IDENTIFIED PROBLEMS: "focus on daily activities "  Anxiety                    DISCHARGE CRITERIA:  Improved stabilization in mood, thinking, and/or behavior  PRELIMINARY DISCHARGE PLAN: Outpatient therapy  PATIENT/FAMILY INVOLVEMENT: This treatment plan has been presented to and reviewed with the patient, Edwin RidgelCameron Keltner, and/or family member.  The patient and family have been given the opportunity to ask questions and make suggestions.  Lendell Capriceasey N Lora Chavers, RN 11/03/2016, 1:04 AM

## 2016-11-03 NOTE — Progress Notes (Signed)
Patient ID: Edwin RidgelCameron Montes, male   DOB: 29-Apr-1986, 30 y.o.   MRN: [id number] Patient admitted voluntary after stating he was having some racing thoughts. Denies SI/HI/AVH at this time. States he was suppose to have his last shot of Abilify on the 20th of this month but was here in the hospital. Patient oriented to the unit. Pleasant and cooperative during admission. Safety maintained with 15 min checks.

## 2016-11-04 LAB — HEMOGLOBIN A1C
Hgb A1c MFr Bld: 5.2 % (ref 4.8–5.6)
MEAN PLASMA GLUCOSE: 103 mg/dL

## 2016-11-04 MED ORDER — LORAZEPAM 2 MG PO TABS
2.0000 mg | ORAL_TABLET | Freq: Every day | ORAL | Status: DC
  Administered 2016-11-04: 2 mg via ORAL
  Filled 2016-11-04: qty 1

## 2016-11-04 NOTE — [facility] Group Notes (Signed)
[facility] Group Notes:  (Nursing/MHT/Case Management/Adjunct)  Date:  11/04/2016  Time:  4:42 AM  Type of Therapy:  Psychoeducational Skills  Participation Level:  Did Not Attend  Summary of Progress/Problems:  Chancy MilroyLaquanda Y Dezmen Alcock 11/04/2016, 4:42 AM

## 2016-11-04 NOTE — Discharge Summary (Signed)
Physician Discharge Summary Note  Patient:  Edwin Montes is an 30 y.o., male MRN:  [id number] DOB:  Oct 28, 1986 Patient phone:  [phone] (home)  Patient address:   [address] [place] [address],  Total Time spent with patient: 30 minutes  Date of Admission:  11/03/2016 Date of Discharge: 11/05/16  Reason for Admission:  psychosis  Principal Problem: Schizophrenia, undifferentiated ([place]) Discharge Diagnoses: Patient Active Problem List   Diagnosis Date Noted  . Schizophrenia, undifferentiated ([place]) [F20.3] 11/03/2016  . Cannabis use disorder, moderate, dependence ([place]) [F12.20] 11/03/2016  . Panic attack [F41.0] 06/23/2016  . Noncompliance [Z91.19] 02/29/2016  . Tobacco use disorder [F17.200] 01/27/2016    History of Present Illness:   Identifying data. Edwin Montes is a 30 year old male with history of schizophrenia.  Chief complaint. "I have racing thoughts again."  History of present illness. Information was obtained from the patient and the chart. The patient has a long history of schizophrenia diagnosed about 10 years ago. He has been doing exceedingly well on Abilify maintena injections and graduated from the group home into his own supervised apartment almost one year ago. He sees Dr. Jamse Arn at [facility] regularly and works with peer support team. He however missed his Jodi Geralds injection that according to my calculation should be on 10/31/2016, last recorded injection was on 11/21. For several days now, the patient has been feeling increasingly anxious, with racing disorganized thoughts and insomnia. He admits that going home for Christmas is stressful as he can not stand comparing his situation to other family members. He has been even thinking about giving up independent living and going back to a group home. Apparently, his neighbors take advantage of him using his phone and borrowing money from him. He denies suicidal ideation, intentions or plans but feels confused  and uncertain. He also reports auditory hallucinations but no commands. He has been smirking as if responding to internal stimuli during the interview. He does not report paranoia. His anxiety has increased. He reports symptoms of generalized anxiety disorder, infrequent panic attacks and some symptoms suggestive of OCD with excessive worries, excessive cleaning and organizing. He denies PTSD symptoms. He believes that he might be depressed as well lately. He denies alcohol or illicit drugs or prescription pill abuse.  Past psychiatric history. The patient is not a good historian and oftentimes gives me conflicting information. Apparently he was diagnosed at the age of 30 and was placed in a group home at that time. He has been a resident of multiple group homes up until recently when he "graduated" and was allowed to live in his own apartment. He reports being hospitalized many times in the past. He denies ever attempting suicide. He cannot explain why he was in the hospital. He believes that he was given diagnosis of bipolar, schizoaffective disorder, schizophrenia, and anxiety. He has been tried on multiple medications including Risperdal, Invega, Zyprexa, Latuda, and Saphris. He does not remember ever being on Seroquel or Geodon. He was tried on Depakote but not tolerated. He has never taken lithium or Tegretol. In the past he was treated with SSRIs for anxiety. He was given Klonopin in the past.  Family psychiatric history. Grandmother with depression.  Social history. He grew up in [place], [place]. He graduated from high school. He denies any history of abuse. He has been a resident of multiple group homes. He is now living in a supervised apartment in [place]. He sees Dr. Jamse Arn at [facility] for mental health and Dr.  Brunetta Genera as his primary doctor. He goes to a day program at the [facility] and works with peer support team.  Past Medical History:  Past Medical History:  Diagnosis Date  .  Anxiety   . Schizophrenia [facility])     Past Surgical History:  Procedure Laterality Date  . NO PAST SURGERIES     Family History: History reviewed. No pertinent family history.  Social History:  History  Alcohol Use No     History  Drug Use No    Social History   Social History  . Marital status: Single    Spouse name: N/A  . Number of children: N/A  . Years of education: N/A   Social History Main Topics  . Smoking status: Current Some Day Smoker    Types: Cigarettes  . Smokeless tobacco: Never Used  . Alcohol use No  . Drug use: No  . Sexual activity: No   Other Topics Concern  . None   Social History Narrative  . None    Hospital Course:    Treatment Plan Summary:  [name] is a 48-yar-old male with a history of schizophrenia admitted for heightened anxiety and racing thoughts.  1. Psychosis. The patient was restarted on oral Abilify 20 mg. He received Abilify maintena injection on 11/21. He received another injection  on 12/22.   2. Mood and Anxiety. He is on Vistaril as needed. Continue luvox 100 mg.   3. Insomnia: Patient slept 7 h last night.  Will d/c on ativan 2 mg qhs prn   4. Smoking. Nicotine patch is available.  5. Disposition. He wants to be discharge on Sunday 12/24 so his mother could take him home to [facility] for the holidays. He will follow up with Dr. Jamse Arn and peer support team at [facility].   Staff working with the patient reports he is doing well. He do not have any concerns about his safety or the safety of others upon discharge. Family has been contacted and they want the patient to be discharged. The mother plans to take him back home to [facility] to spend the holidays with her. Patient has denied having any access to gun and also family has deny he has any access to guns.  Today the patient reports doing well. He denies having any side effects or physical complaints. He denies having depressed mood, hopelessness, helplessness,  SI, HI or auditory or visual hallucinations.   Per nursing is that he has been compliant with all his medications. He has not been disruptive, aggressive or display any kind of agitation.  Patient has not required seclusion, restraints or forced medications.   Physical Findings: AIMS:  , ,  ,  ,    CIWA:    COWS:     Musculoskeletal: Strength & Muscle Tone: within normal limits Gait & Station: normal Patient leans: N/A  Psychiatric Specialty Exam: Physical Exam  Constitutional: He is oriented to person, place, and time. He appears well-developed.  HENT:  Head: Normocephalic and atraumatic.  Eyes: EOM are normal.  Neck: Normal range of motion.  Respiratory: Effort normal.  Musculoskeletal: Normal range of motion.  Neurological: He is alert and oriented to person, place, and time.    Review of Systems  Constitutional: Negative.   HENT: Negative.   Eyes: Negative.   Respiratory: Negative.   Cardiovascular: Negative.   Gastrointestinal: Negative.   Genitourinary: Negative.   Musculoskeletal: Negative.   Skin: Negative.   Neurological: Negative.   Endo/Heme/Allergies:  Negative.   Psychiatric/Behavioral: Negative.     Blood pressure 118/88, pulse 71, temperature 97.8 F (36.6 C), temperature source Oral, resp. rate 18, height '6\' 2"'  (1.88 m), weight 94.3 kg (208 lb), SpO2 100 %.Body mass index is 26.71 kg/m.  General Appearance: Fairly Groomed  Eye Contact:  Fair  Speech:  Clear and Coherent  Volume:  Normal  Mood:  Euthymic  Affect:  Appropriate  Thought Process:  Linear and Descriptions of Associations: Intact  Orientation:  Full (Time, Place, and Person)  Thought Content:  Hallucinations: None  Suicidal Thoughts:  No  Homicidal Thoughts:  No  Memory:  Immediate;   Good Recent;   Good Remote;   Good  Judgement:  Fair  Insight:  Fair  Psychomotor Activity:  Normal  Concentration:  Concentration: Good and Attention Span: Good  Recall:  Good  Fund of  Knowledge:  Good  Language:  Good  Akathisia:  No  Handed:    AIMS (if indicated):     Assets:  Communication Skills Physical Health  ADL's:  Intact  Cognition:  WNL  Sleep:  Number of Hours: 7.25     Have you used any form of tobacco in the last 30 days? (Cigarettes, Smokeless Tobacco, Cigars, and/or Pipes): Yes  Has this patient used any form of tobacco in the last 30 days? (Cigarettes, Smokeless Tobacco, Cigars, and/or Pipes) Yes, Yes, A prescription for an FDA-approved tobacco cessation medication was offered at discharge and the patient refused  Blood Alcohol level:  Lab Results  Component Value Date   [facility] <5 11/01/2016   ETH <5 79/01/8332    Metabolic Disorder Labs:  Lab Results  Component Value Date   HGBA1C 5.2 11/03/2016   MPG 103 11/03/2016   Lab Results  Component Value Date   PROLACTIN 1.7 (L) 01/27/2016   Lab Results  Component Value Date   CHOL 214 (H) 11/03/2016   TRIG 76 11/03/2016   HDL 58 11/03/2016   CHOLHDL 3.7 11/03/2016   VLDL 15 11/03/2016   LDLCALC 141 (H) 11/03/2016   LDLCALC 100 (H) 01/27/2016   Results for Edwin Montes, Edwin Montes (MRN [id number]) as of 11/04/2016 14:32  Ref. Range 11/01/2016 22:43 11/01/2016 22:44 11/03/2016 06:29 11/03/2016 06:29 11/03/2016 07:16  Sodium Latest Ref Range: 135 - 145 mmol/L 141      Potassium Latest Ref Range: 3.5 - 5.1 mmol/L 4.2      Chloride Latest Ref Range: 101 - 111 mmol/L 106      CO2 Latest Ref Range: 22 - 32 mmol/L 30      Mean Plasma Glucose Latest Units: mg/dL     103  BUN Latest Ref Range: 6 - 20 mg/dL 15      Creatinine Latest Ref Range: 0.61 - 1.24 mg/dL 0.92      Calcium Latest Ref Range: 8.9 - 10.3 mg/dL 9.1      EGFR (Non-African Amer.) Latest Ref Range: >60 mL/min >60      EGFR (African American) Latest Ref Range: >60 mL/min >60      Glucose Latest Ref Range: 65 - 99 mg/dL 91      Anion gap Latest Ref Range: 5 - 15  5      Alkaline Phosphatase Latest Ref Range: 38 - 126 U/L 66      Albumin  Latest Ref Range: 3.5 - 5.0 g/dL 4.6      AST Latest Ref Range: 15 - 41 U/L 26      ALT Latest Ref Range:  17 - 63 U/L 20      Total Protein Latest Ref Range: 6.5 - 8.1 g/dL 7.2      Total Bilirubin Latest Ref Range: 0.3 - 1.2 mg/dL 0.3      Cholesterol Latest Ref Range: 0 - 200 mg/dL     214 (H)  Triglycerides Latest Ref Range: <150 mg/dL     76  HDL Cholesterol Latest Ref Range: >40 mg/dL     58  LDL (calc) Latest Ref Range: 0 - 99 mg/dL     141 (H)  VLDL Latest Ref Range: 0 - 40 mg/dL     15  Total CHOL/HDL Ratio Latest Units: RATIO     3.7  WBC Latest Ref Range: 3.8 - 10.6 K/uL 8.0      RBC Latest Ref Range: 4.40 - 5.90 MIL/uL 5.18      Hemoglobin Latest Ref Range: 13.0 - 18.0 g/dL 15.9      HCT Latest Ref Range: 40.0 - 52.0 % 45.4      MCV Latest Ref Range: 80.0 - 100.0 fL 87.6      MCH Latest Ref Range: 26.0 - 34.0 pg 30.8      MCHC Latest Ref Range: 32.0 - 36.0 g/dL 35.1      RDW Latest Ref Range: 11.5 - 14.5 % 12.9      Platelets Latest Ref Range: 150 - 440 K/uL 274      Neutrophils Latest Units: % 45      Lymphocytes Latest Units: % 40      Monocytes Relative Latest Units: % 8      Eosinophil Latest Units: % 6      Basophil Latest Units: % 1      NEUT# Latest Ref Range: 1.4 - 6.5 K/uL 3.5      Lymphocyte # Latest Ref Range: 1.0 - 3.6 K/uL 3.2      Monocyte # Latest Ref Range: 0.2 - 1.0 K/uL 0.7      Eosinophils Absolute Latest Ref Range: 0 - 0.7 K/uL 0.5      Basophils Absolute Latest Ref Range: 0 - 0.1 K/uL 0.1      Hemoglobin A1C Latest Ref Range: 4.8 - 5.6 %     5.2  TSH Latest Ref Range: 0.350 - 4.500 uIU/mL     1.957  Alcohol, Ethyl (B) Latest Ref Range: <5 mg/dL <5      Amphetamines, Ur Screen Latest Ref Range: NONE DETECTED   NONE DETECTED     Barbiturates, Ur Screen Latest Ref Range: NONE DETECTED   NONE DETECTED     Benzodiazepine, Ur Scrn Latest Ref Range: NONE DETECTED   NONE DETECTED     Cocaine Metabolite,Ur [place] Latest Ref Range: NONE DETECTED   NONE DETECTED      Methadone Scn, Ur Latest Ref Range: NONE DETECTED   NONE DETECTED     MDMA (Ecstasy)Ur Screen Latest Ref Range: NONE DETECTED   NONE DETECTED     Cannabinoid 50 Ng, Ur [place] Latest Ref Range: NONE DETECTED   POSITIVE (A)     Opiate, Ur Screen Latest Ref Range: NONE DETECTED   NONE DETECTED     Phencyclidine (PCP) Ur S Latest Ref Range: NONE DETECTED   NONE DETECTED     Tricyclic, Ur Screen Latest Ref Range: NONE DETECTED   NONE DETECTED      See Psychiatric Specialty Exam and Suicide Risk Assessment completed by Attending Physician prior to discharge.  Discharge destination:  Home  Is patient on multiple antipsychotic therapies at discharge:  Yes,   Do you recommend tapering to monotherapy for antipsychotics?  Yes    Has Patient had three or more failed trials of antipsychotic monotherapy by history:  No  Recommended Plan for Multiple Antipsychotic Therapies: Taper to monotherapy as described:  gradually decrease abilify oral Discharge Instructions    Diet - low sodium heart healthy    Complete by:  As directed    Increase activity slowly    Complete by:  As directed      Allergies as of 11/05/2016   No Known Allergies     Medication List    STOP taking these medications   hydrOXYzine 25 MG capsule Commonly known as:  VISTARIL     TAKE these medications     Indication  ARIPiprazole 20 MG tablet Commonly known as:  ABILIFY Take 1 tablet (20 mg total) by mouth daily. What changed:  medication strength  how much to take  Indication:  Schizophrenia   ARIPiprazole ER 400 MG Susr Inject 400 mg into the muscle every 28 (twenty-eight) days. Due on January 19 Start taking on:  12/01/2016 What changed:  additional instructions  Indication:  Schizophrenia   fluvoxaMINE 100 MG tablet Commonly known as:  LUVOX Take 1 tablet (100 mg total) by mouth at bedtime.  Indication:  Obsessive Compulsive Disorder, Panic Disorder   LORazepam 2 MG tablet Commonly known as:   ATIVAN Take 1 tablet (2 mg total) by mouth at bedtime as needed for anxiety. What changed:  medication strength  how much to take  when to take this  Indication:  insomnia      Follow-up [place]. Go in 7 day(s).   Why:  Follow-up with [facility] within 7 days of discharge for medication management and outpatient therapy. Please bring discharge summary and current medications. Arrive early to ensure prompt service.  Contact information: [place] [phone]           >30 minutes. >50 % of the time was spent in coordination of care.  Signed: Hildred Priest, MD 11/05/2016, 10:18 AM

## 2016-11-04 NOTE — Progress Notes (Signed)
Patient is cooperative. He remains flat on approach. He denies suicidal or homicidal ideations and AV hallucinations.Appropriate with staff & peers.Attended groups.Compliant with medications..Support & encouragement given.

## 2016-11-04 NOTE — Progress Notes (Signed)
Patient has been isolative to room most of the day. Did attend group. Has minimal interaction with staff unless approached and asked questions. Denies SI/HI/AVH. Is medication compliant. Did received flu vaccine today. Did not voice any concerns. Remains on q15 minute checks for safety. Will continue to monitor.

## 2016-11-04 NOTE — [facility] Group Notes (Signed)
[facility] LCSW Group Therapy  11/04/2016 3:50 PM  Type of Therapy:  Group Therapy  Participation Level:  None  Participation Quality:  Attentive  Affect:  Appropriate and Flat  Cognitive:  Alert  Insight:  None  Engagement in Therapy:  Limited  Modes of Intervention:  Activity, Discussion, Education, Problem-solving, Reality Testing and Support  Summary of Progress/Problems: Communications: Patients identify how individuals communicate with one another appropriately and inappropriately. Patients will be guided to discuss their thoughts, feelings, and behaviors related to barriers when communicating. The group will process together ways to execute positive and appropriate communications. Patient was attentive during group and would engage when prompted by CSW.    Edwin Montes G. Garnette CzechSampson MSW, [facility] 11/04/2016 3:57 PM

## 2016-11-04 NOTE — [facility] Suicide Risk Assessment (Signed)
[facility]   Principal Problem: Schizophrenia, undifferentiated ([facility]) Discharge Diagnoses:  Patient Active Problem List   Diagnosis Date Noted  . Schizophrenia, undifferentiated ([facility]) [F20.3] 11/03/2016  . 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 Panic attack [F41.0] 06/23/2016  . Noncompliance [Z91.19] 02/29/2016  . Tobacco use disorder [F17.200] 01/27/2016    Total Time spent with patient: 30 minutes  Musculoskeletal:  Psychiatric Specialty Exam: ROS  Blood pressure 118/88, pulse 71, temperature 97.8 F (36.6 C), temperature source Oral, resp. rate 18, height 6\' 2"  (1.88 m), weight 94.3 kg (208 lb), SpO2 100 %.Body mass index is 26.71 kg/m.                                                       Mental Status Per Nursing Assessment::   On Admission:  NA  Demographic Factors:  Male and Caucasian  Loss Factors: NA  Historical Factors: Family history of mental illness or substance abuse  Risk Reduction Factors:   Sense of responsibility to family, Living with another person, especially a relative and Positive social support  Continued Clinical Symptoms:  Previous Psychiatric Diagnoses and Treatments  Cognitive Features That Contribute To Risk:  Closed-mindedness    Suicide Risk:  Minimal: No identifiable suicidal ideation.  Patients presenting with no risk factors but with morbid ruminations; may be classified as minimal risk based on the severity of the depressive symptoms  Follow-up Information    [facility]. Go in 7 day(s).   Why:  Follow-up with [facility] within 7 days of discharge for medication management and outpatient therapy. Please bring discharge summary and current medications. Arrive early to ensure prompt service.  Contact information: [address] [place] [address] [phone]            Jimmy FootmanHernandez-Gonzalez,  Salil Raineri, MD 11/05/2016, 10:12  AM

## 2016-11-05 MED ORDER — LORAZEPAM 2 MG PO TABS: 2.0000 mg | ORAL_TABLET | Freq: Every evening | ORAL | 0 refills | Status: DC | PRN

## 2016-11-05 MED ORDER — ARIPIPRAZOLE ER 400 MG IM SUSR: 400.0000 mg | INTRAMUSCULAR | 1 refills | Status: DC

## 2016-11-05 NOTE — Progress Notes (Signed)
Patient left ambulatory to his mom. Left with AVS, Suicide Risk Assessments, Transition Record, and belongings. Discharge instructions explained to patient. Denied and SI/HI/AVH before being discharged.

## 2016-11-05 NOTE — Progress Notes (Signed)
Patient is cooperative on approach but minimal interaction with staff & peers.Denies suicidal or homicidal ideations & AV hallucinations.Compliant with medications.Attended groups.Support & encouragement given.

## 2016-11-05 NOTE — [facility] Group Notes (Signed)
[facility] LCSW Group Therapy  11/05/2016 12:16 PM  Type of Therapy:  Group Therapy  Participation Level:  Patient did not attend group. CSW invited patient to group.   Summary of Progress/Problems: Stress management: Patients defined and discussed the topic of stress and the related symptoms and triggers for stress. Patients identified healthy coping skills they would like to try during hospitalization and after discharge to manage stress in a healthy way. CSW offered insight to varying stress management techniques.   Allen Basista G. Garnette CzechSampson MSW, LCSWA 11/05/2016, 12:16 PM

## 2016-11-05 NOTE — [facility] Group Notes (Signed)
[facility] Group Notes:  (Nursing/MHT/Case Management/Adjunct)  Date:  11/05/2016  Time:  5:46 AM  Type of Therapy:  Psychoeducational Skills  Participation Level:  Did Not Attend  Summary of Progress/Problems:  Breezie Micucci Y Thania Woodlief 11/05/2016, 5:46 AM 

## 2016-11-05 NOTE — Progress Notes (Signed)
  [facility]  Will you be returning to the same living situation after discharge:  Yes,  home with mother At discharge, do you have transportation home?: Yes,  mother Do you have the ability to pay for your medications: Yes,  patient has insurance  Release of information consent forms completed and in the chart;  Patient's signature needed at discharge.  Patient to Follow up at: Follow-up Information    [facility]   Why:  [facility]-up with [facility] within 7 days of discharge for medication management and outpatient therapy. Please bring discharge summary and current medications. Arrive early to ensure prompt service.  Contact information: [address] [place] [address] [phone]           Next level of care provider has [facility] to [facility] Link:no  Safety Planning and Suicide Prevention discussed: Yes,  with mother and patient  Have you used any form of tobacco in the last 30 days? (Cigarettes, Smokeless Tobacco, Cigars, and/or Pipes): Yes  Has patient been referred to the Quitline?: Patient refused referral  Patient has been referred for addiction treatment: Yes  Barbette Mcglaun G. Garnette CzechSampson MSW, LCSWA 11/05/2016, 12:17 PM

## 2016-12-04 ENCOUNTER — Encounter: Payer: Self-pay | Admitting: Emergency Medicine

## 2016-12-04 DIAGNOSIS — F17210 Nicotine dependence, cigarettes, uncomplicated: Secondary | ICD-10-CM | POA: Diagnosis not present

## 2016-12-04 DIAGNOSIS — Z79899 Other long term (current) drug therapy: Secondary | ICD-10-CM | POA: Insufficient documentation

## 2016-12-04 DIAGNOSIS — F209 Schizophrenia, unspecified: Secondary | ICD-10-CM | POA: Insufficient documentation

## 2016-12-04 DIAGNOSIS — F203 Undifferentiated schizophrenia: Secondary | ICD-10-CM | POA: Diagnosis not present

## 2016-12-04 DIAGNOSIS — Z046 Encounter for general psychiatric examination, requested by authority: Secondary | ICD-10-CM | POA: Diagnosis present

## 2016-12-04 LAB — URINE DRUG SCREEN, QUALITATIVE (ARMC ONLY)
AMPHETAMINES, UR SCREEN: NOT DETECTED
Barbiturates, Ur Screen: NOT DETECTED
Benzodiazepine, Ur Scrn: NOT DETECTED
Cannabinoid 50 Ng, Ur ~~LOC~~: NOT DETECTED
Cocaine Metabolite,Ur ~~LOC~~: NOT DETECTED
MDMA (ECSTASY) UR SCREEN: NOT DETECTED
METHADONE SCREEN, URINE: NOT DETECTED
Opiate, Ur Screen: NOT DETECTED
PHENCYCLIDINE (PCP) UR S: NOT DETECTED
TRICYCLIC, UR SCREEN: NOT DETECTED

## 2016-12-04 LAB — COMPREHENSIVE METABOLIC PANEL
ALBUMIN: 4.4 g/dL (ref 3.5–5.0)
ALT: 16 U/L — ABNORMAL LOW (ref 17–63)
AST: 25 U/L (ref 15–41)
Alkaline Phosphatase: 61 U/L (ref 38–126)
Anion gap: 6 (ref 5–15)
BUN: 18 mg/dL (ref 6–20)
CHLORIDE: 104 mmol/L (ref 101–111)
CO2: 29 mmol/L (ref 22–32)
Calcium: 9.1 mg/dL (ref 8.9–10.3)
Creatinine, Ser: 1.06 mg/dL (ref 0.61–1.24)
GFR calc Af Amer: >60 mL/min (ref >60)
Glucose, Bld: 82 mg/dL (ref 65–99)
POTASSIUM: 4.2 mmol/L (ref 3.5–5.1)
Sodium: 139 mmol/L (ref 135–145)
Total Bilirubin: 0.8 mg/dL (ref 0.3–1.2)
Total Protein: 7.1 g/dL (ref 6.5–8.1)

## 2016-12-04 LAB — CBC
HCT: 43.5 % (ref 40.0–52.0)
Hemoglobin: 15.4 g/dL (ref 13.0–18.0)
MCH: 30.5 pg (ref 26.0–34.0)
MCHC: 35.3 g/dL (ref 32.0–36.0)
MCV: 86.3 fL (ref 80.0–100.0)
PLATELETS: 292 10*3/uL (ref 150–440)
RBC: 5.04 MIL/uL (ref 4.40–5.90)
RDW: 12.5 % (ref 11.5–14.5)
WBC: 6.8 10*3/uL (ref 3.8–10.6)

## 2016-12-04 LAB — ETHANOL

## 2016-12-04 LAB — ACETAMINOPHEN LEVEL

## 2016-12-04 LAB — SALICYLATE LEVEL: Salicylate Lvl: <7 mg/dL (ref 2.8–30.0)

## 2016-12-04 NOTE — ED Triage Notes (Signed)
"  I'm having racing thoughts"  Onset of symptoms today.  Patient takes Abilify injection, last given 11/02/2016.  Injections given through [facility] and has appointment scheduled for January injection.  Denies SI/ HI.

## 2016-12-05 ENCOUNTER — Emergency Department
Admission: EM | Admit: 2016-12-05 | Discharge: 2016-12-05 | Disposition: A | Discharge status: Home / Self Care | Payer: Medicare Other | Attending: Emergency Medicine | Admitting: Emergency Medicine

## 2016-12-05 DIAGNOSIS — F203 Undifferentiated schizophrenia: Secondary | ICD-10-CM

## 2016-12-05 DIAGNOSIS — F410 Panic disorder [episodic paroxysmal anxiety] without agoraphobia: Secondary | ICD-10-CM | POA: Diagnosis present

## 2016-12-05 DIAGNOSIS — F209 Schizophrenia, unspecified: Secondary | ICD-10-CM | POA: Diagnosis not present

## 2016-12-05 MED ORDER — FLUVOXAMINE MALEATE 50 MG PO TABS
100.0000 mg | ORAL_TABLET | Freq: Every day | ORAL | Status: DC
  Administered 2016-12-05: 100 mg via ORAL
  Filled 2016-12-05: qty 2

## 2016-12-05 MED ORDER — ARIPIPRAZOLE 5 MG PO TABS
20.0000 mg | ORAL_TABLET | Freq: Every day | ORAL | Status: DC
  Administered 2016-12-05: 20 mg via ORAL
  Filled 2016-12-05: qty 4

## 2016-12-05 MED ORDER — LORAZEPAM 2 MG PO TABS: 2.0000 mg | ORAL_TABLET | Freq: Every evening | ORAL | Status: DC | PRN

## 2016-12-05 NOTE — ED Notes (Signed)
Pt is alert and oriented this evening. Pt mood is sad/anxious and his affect is blunted. Pt denies SI/HI and AVH at this time and [facility]. Writer discussed tx plan and administered medications as prescribed. Pt went to sleep right away, and 15 minute checks are ongoing for safety.

## 2016-12-05 NOTE — ED Provider Notes (Signed)
[facility] Emergency Department Provider Note  ____________________________________________  Time seen: Approximately 12:57 AM  I have reviewed the triage vital signs and the nursing notes.   HISTORY  Chief Complaint Medical Clearance   HPI Edwin Montes is a 31 y.o. male history of anxiety and schizophrenia who presents for evaluation of racing thoughts. Patient reports compliance with his medications. He is supposed to be on Abilify injection once a month with the last one being December 21. He reports that the thoughts started earlier today. He denies SI or HI, visual or auditory hallucinations. He denies any drug use or alcohol. Patient brought himself to the emergency room voluntarily for help. He denies any medical complaints at this time.  Past Medical History:  Diagnosis Date  . Anxiety   . Schizophrenia [facility](HCC)     Patient Active Problem List   Diagnosis Date Noted  . 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 Cannabis use disorder, moderate, dependence (HCC) 11/03/2016  . Panic attack 06/23/2016  . Noncompliance 02/29/2016  . Tobacco use disorder 01/27/2016    Past Surgical History:  Procedure Laterality Date  . NO PAST SURGERIES      Prior to Admission medications   Medication Sig Start Date End Date Taking? Authorizing Provider  ARIPiprazole (ABILIFY) 20 MG tablet Take 1 tablet (20 mg total) by mouth daily. 11/04/16   Shari ProwsJolanta B Pucilowska, MD  ARIPiprazole ER 400 MG SUSR Inject 400 mg into the muscle every 28 (twenty-eight) days. Due on January 19 12/01/16   Jimmy FootmanAndrea Hernandez-Gonzalez, MD  fluvoxaMINE (LUVOX) 100 MG tablet Take 1 tablet (100 mg total) by mouth at bedtime. 11/04/16   Shari ProwsJolanta B Pucilowska, MD  LORazepam (ATIVAN) 2 MG tablet Take 1 tablet (2 mg total) by mouth at bedtime as needed for anxiety. 11/05/16   Jimmy FootmanAndrea Hernandez-Gonzalez, MD    Allergies Patient has no known allergies.  No family history on  file.  Social History Social History  Substance Use Topics  . Smoking status: Current Some Day Smoker    Types: Cigarettes  . Smokeless tobacco: Never Used  . Alcohol use No    Review of Systems  Constitutional: Negative for fever. Eyes: Negative for visual changes. ENT: Negative for sore throat. Neck: No neck pain  Cardiovascular: Negative for chest pain. Respiratory: Negative for shortness of breath. Gastrointestinal: Negative for abdominal pain, vomiting or diarrhea. Genitourinary: Negative for dysuria. Musculoskeletal: Negative for back pain. Skin: Negative for rash. Neurological: Negative for headaches, weakness or numbness. Psych: No SI or HI. + racing thoughts  ____________________________________________   PHYSICAL EXAM:  VITAL SIGNS: Vitals:   12/05/16 0117  BP: 105/65  Pulse: 67  Resp: 16  Temp: 97.7 F (36.5 C)   Constitutional: Alert and oriented. Well appearing and in no apparent distress. HEENT:      Head: Normocephalic and atraumatic.         Eyes: Conjunctivae are normal. Sclera is non-icteric. EOMI. PERRL      Mouth/Throat: Mucous membranes are moist.       Neck: Supple with no signs of meningismus. Cardiovascular: Regular rate and rhythm. No murmurs, gallops, or rubs. 2+ symmetrical distal pulses are present in all extremities. No JVD. Respiratory: Normal respiratory effort. Lungs are clear to auscultation bilaterally. No wheezes, crackles, or rhonchi.  Gastrointestinal: Soft, non tender, and non distended with positive bowel sounds. No rebound or guarding. Genitourinary: No CVA tenderness. Musculoskeletal: Nontender with normal range of motion in all extremities. No edema, cyanosis, or erythema  of extremities. Neurologic: Normal speech and language. Face is symmetric. Moving all extremities. No gross focal neurologic deficits are appreciated. Skin: Skin is warm, dry and intact. No rash noted. Psychiatric: Mood and affect are blunt. Speech and  behavior are normal. No SI or HI  ____________________________________________   LABS (all labs ordered are listed, but only abnormal results are displayed)  Labs Reviewed  COMPREHENSIVE METABOLIC PANEL - Abnormal; Notable for the following:       Result Value   ALT 16 (*)    All other components within normal limits  ACETAMINOPHEN LEVEL - Abnormal; Notable for the following:    Acetaminophen (Tylenol), Serum <10 (*)    All other components within normal limits  ETHANOL  SALICYLATE LEVEL  CBC  URINE DRUG SCREEN, QUALITATIVE ([facility] ONLY)   ____________________________________________  EKG  none ____________________________________________  RADIOLOGY  none  ____________________________________________   PROCEDURES  Procedure(s) performed: None Procedures Critical Care performed:  None ____________________________________________   INITIAL IMPRESSION / ASSESSMENT AND PLAN / ED COURSE  31 y.o. male history of anxiety and schizophrenia who presents for evaluation of racing thoughts x 1 day. Patient is here voluntarily. No SI or HI. Patient agrees stay into the morning to be evaluated by psychiatry. Blood work with no acute findings. Patient is medically cleared.     Pertinent labs & imaging results that were available during my care of the patient were reviewed by me and considered in my medical decision making (see chart for details).    ____________________________________________   FINAL CLINICAL IMPRESSION(S) / ED DIAGNOSES  Final diagnoses:  Schizophrenia, unspecified type (HCC)      NEW MEDICATIONS STARTED DURING THIS VISIT:  New Prescriptions   No medications on file     Note:  This document was prepared using Dragon voice recognition software and may include unintentional dictation errors.    Nita Sickle, MD 12/05/16 [phone]

## 2016-12-05 NOTE — BH Assessment (Signed)
Assessment Note  Edwin Montes is an 31 y.o. male. Edwin Montes reports that he has been having racing thoughts, anxiety and depression today. He reports depressive symptoms of mood and not being aware of his surrounding, light headed and shortness of breath. He reports that he is sleeping less. He reports feeling down. He reports problems with concentrating.  He denied having auditory or visual hallucinations. He denied suicidal or homicidal ideation or intent.  He denied the use of alcohol or drugs. He reports being under stress financial stressors at this time.  He is worried about finding a safe environment to live.  He states that it is crowded where he lives.He reports that he has not received his Abilify injection, which is due. He is currently being seen at [facility]   Diagnosis: Schizophrenia  Past Medical History:  Past Medical History:  Diagnosis Date  . Anxiety   . Schizophrenia [facility])     Past Surgical History:  Procedure Laterality Date  . NO PAST SURGERIES      Family History: No family history on file.  Social History:  reports that he has been smoking Cigarettes.  He has never used smokeless tobacco. He reports that he does not drink alcohol or use drugs.  Additional Social History:  Alcohol / Drug Use History of alcohol / drug use?: No history of alcohol / drug abuse  CIWA: CIWA-Ar BP: 105/65 Pulse Rate: 67 COWS:    Allergies: No Known Allergies  Home Medications:  (Not in a hospital admission)  OB/GYN Status:  No LMP for male patient.  General Assessment Data Location of Assessment: [facility] ED TTS Assessment: In system Is this a Tele or Face-to-Face Assessment?: Face-to-Face Is this an Initial Assessment or a Re-assessment for this encounter?: Initial Assessment Marital status: Single Maiden name: n/a Is patient pregnant?: No Pregnancy Status: No Living Arrangements: Alone Can pt return to current living arrangement?: Yes Is patient capable of signing voluntary  admission?: Yes Referral Source: Self/Family/Friend Insurance type: Designer, industrial/product Exam [facility] Walk-in ONLY) Medical Exam completed: Yes  Crisis Care Plan Living Arrangements: Alone Legal Guardian: Other: (Self) Name of Psychiatrist: [facility] Name of Therapist: [facility]  Education Status Is patient currently in school?: No Current Grade: n/a Highest grade of school patient has completed: 12th Name of school: [employer] person: n/a  Risk to self with the past 6 months Suicidal Ideation: No Has patient been a risk to self within the past 6 months prior to admission? : No Suicidal Intent: No Has patient had any suicidal intent within the past 6 months prior to admission? : No Is patient at risk for suicide?: No Suicidal Plan?: No Has patient had any suicidal plan within the past 6 months prior to admission? : No Access to Means: No What has been your use of drugs/alcohol within the last 12 months?: Denied use Previous Attempts/Gestures: No How many times?: 0 Other Self Harm Risks: denied Triggers for Past Attempts: None known Intentional Self Injurious Behavior: None Family Suicide History: No Recent stressful life event(s): Other (Comment), Financial Problems (housing) Persecutory voices/beliefs?: No Depression: Yes Depression Symptoms: Despondent Substance abuse history and/or treatment for substance abuse?: No Suicide prevention information given to non-admitted patients: Not applicable  Risk to Others within the past 6 months Homicidal Ideation: No Does patient have any lifetime risk of violence toward others beyond the six months prior to admission? : No Thoughts of Harm to Others: No Current Homicidal Intent: No Current Homicidal  Plan: No Access to Homicidal Means: No Identified Victim: None identified History of harm to others?: No Assessment of Violence: None Noted Violent Behavior Description: denied Does patient have access to  weapons?: No Criminal Charges Pending?: No Does patient have a court date: No Is patient on probation?: No  Psychosis Hallucinations: None noted Delusions: None noted  Mental Status Report Appearance/Hygiene: In scrubs Eye Contact: Fair Motor Activity: Unremarkable Speech: Logical/coherent Level of Consciousness: Alert Mood: Depressed Affect: Flat Anxiety Level: None Thought Processes: Coherent Judgement: Unimpaired Orientation: Person, Place, Time, Situation Obsessive Compulsive Thoughts/Behaviors: None  Cognitive Functioning Concentration: Decreased Memory: Recent Intact IQ: Average Insight: Fair Impulse Control: Fair Appetite: Good Sleep: No Change Vegetative Symptoms: None  ADLScreening [facility] Assessment [facility]) Patient's cognitive ability adequate to safely complete daily activities?: Yes Patient able to express need for assistance with ADLs?: Yes Independently performs ADLs?: Yes (appropriate for developmental age)  Prior Inpatient Therapy Prior Inpatient Therapy: Yes Prior Therapy Dates: 2017 and prior [facility] Reason for Treatment: Schizophrenia  Prior Outpatient Therapy Prior Outpatient Therapy: Yes Prior Therapy Dates: Current Prior Therapy Facilty/Provider(s): [facility] Reason for Treatment: Schizophrenia Does patient have an ACCT team?: No Does patient have Intensive In-House Services?  : No Does patient have [facility] services? : No Does patient have P4CC services?: No  ADL Screening (condition at time of admission) Patient's cognitive ability adequate to safely complete daily activities?: Yes Patient able to express need for assistance with ADLs?: Yes Independently performs ADLs?: Yes (appropriate for developmental age)       Abuse/Neglect Assessment (Assessment to be complete while patient is alone) Physical Abuse: Denies Verbal Abuse: Denies Sexual Abuse:  Denies Exploitation of patient/patient's resources: Denies Self-Neglect: Denies     Merchant navy officerAdvance Directives (For Healthcare) Does Patient Have a Medical Advance Directive?: No    Additional Information 1:1 In Past 12 Months?: No CIRT Risk: No Elopement Risk: No Does patient have medical clearance?: Yes     Disposition:  Disposition Initial Assessment Completed for this Encounter: Yes Disposition of Patient: Other dispositions  On Site Evaluation by:   Reviewed with Physician:    Justice DeedsKeisha Kathelene Rumberger 12/05/2016 1:36 AM

## 2016-12-05 NOTE — ED Notes (Signed)
Pt has called for a ride home. His ride will be here around 8 pm. Pt has signed for his discharge paperwork.

## 2016-12-05 NOTE — Discharge Instructions (Signed)
Return to the emergency department if you develop thoughts of hurting yourself or anyone else, hallucinations, or any other symptoms concerning to you. °

## 2016-12-05 NOTE — ED Notes (Signed)
Patient resting quietly in room. No noted distress or abnormal behaviors noted. Will continue 15 minute checks and observation by security camera for safety. 

## 2016-12-05 NOTE — Consult Note (Signed)
Edwin Montes   Reason for Montes:  Montes for 31 year old man with schizophrenia Referring Physician:  Mariea Clonts Patient Identification: Edwin Montes MRN:  [id number] Principal Diagnosis: Schizophrenia, undifferentiated ([place]) Diagnosis:   Patient Active Problem List   Diagnosis Date Noted  . Schizophrenia, undifferentiated ([place]) [F20.3] 11/03/2016  . Cannabis use disorder, moderate, dependence ([place]) [F12.20] 11/03/2016  . Panic attack [F41.0] 06/23/2016  . Noncompliance [Z91.19] 02/29/2016  . Tobacco use disorder [F17.200] 01/27/2016    Total Time spent with patient: 1 hour  Subjective:   Edwin Montes is a 31 y.o. male patient admitted with "my thoughts were racing".  HPI:  Patient interviewed. Chart reviewed. Patient known from previous encounters. 31 year old man with schizophrenia. He says he had his mother dropped him off here because his thoughts were racing. This is been going on for the last 2 days. This is his typical complaint when he comes to the hospital and it is always difficult for him to be more clear describing what this means. He denies having any suicidal or homicidal thoughts. He denies having any hallucinations. He tells me he feels nervous and uncomfortable at the apartment where he lives because it is always crowded and a lot of people come around knocking on his door. Patient says he has been compliant with his medicine. Denies any recent use of drugs.  Social history: Lives in independent housing. Has frequent contact with his mental health providers. Has family that stay in touch with him.  Medical history: No serious medical problems outside of schizophrenia.  Substance abuse history: History of cannabis abuse which always makes his symptoms worse. Currently appears to be laying off of the week.  Past Psychiatric History: Patient has had multiple hospitalizations and was just here in our hospital last month. Very similar circumstances. He  will oh his come in with "racing thoughts" and then well usually returned to his baseline quickly. No history of suicide attempts or violence in the past.  Risk to Self: Suicidal Ideation: No Suicidal Intent: No Is patient at risk for suicide?: No Suicidal Plan?: No Access to Means: No What has been your use of drugs/alcohol within the last 12 months?: Denied use How many times?: 0 Other Self Harm Risks: denied Triggers for Past Attempts: None known Intentional Self Injurious Behavior: None Risk to Others: Homicidal Ideation: No Thoughts of Harm to Others: No Current Homicidal Intent: No Current Homicidal Plan: No Access to Homicidal Means: No Identified Victim: None identified History of harm to others?: No Assessment of Violence: None Noted Violent Behavior Description: denied Does patient have access to weapons?: No Criminal Charges Pending?: No Does patient have a court date: No Prior Inpatient Therapy: Prior Inpatient Therapy: Yes Prior Therapy Dates: 2017 and prior Prior Therapy Facilty/Provider(s): [place], [place], [place], [place] Reason for Treatment: Schizophrenia Prior Outpatient Therapy: Prior Outpatient Therapy: Yes Prior Therapy Dates: Current Prior Therapy Facilty/Provider(s): [facility] Reason for Treatment: Schizophrenia Does patient have an ACCT team?: No Does patient have Intensive In-House Services?  : No Does patient have [facility] services? : No Does patient have P4CC services?: No  Past Medical History:  Past Medical History:  Diagnosis Date  . Anxiety   . Schizophrenia [facility])     Past Surgical History:  Procedure Laterality Date  . NO PAST SURGERIES     Family History: No family history on file. Family Psychiatric  History: None identified Social History:  History  Alcohol Use No     History  Drug Use  No    Social History   Social History  . Marital status: Single    Spouse name: N/A  . Number of children: N/A  . Years of  education: N/A   Social History Main Topics  . Smoking status: Current Some Day Smoker    Types: Cigarettes  . Smokeless tobacco: Never Used  . Alcohol use No  . Drug use: No  . Sexual activity: No   Other Topics Concern  . None   Social History Narrative  . None   Additional Social History:    Allergies:  No Known Allergies  Labs:  Results for orders placed or performed during the hospital encounter of 12/05/16 (from the past 48 hour(s))  Comprehensive metabolic panel     Status: Abnormal   Collection Time: 12/04/16 10:19 PM  Result Value Ref Range   Sodium 139 135 - 145 mmol/L   Potassium 4.2 3.5 - 5.1 mmol/L   Chloride 104 101 - 111 mmol/L   CO2 29 22 - 32 mmol/L   Glucose, Bld 82 65 - 99 mg/dL   BUN 18 6 - 20 mg/dL   Creatinine, Ser 1.06 0.61 - 1.24 mg/dL   Calcium 9.1 8.9 - 10.3 mg/dL   Total Protein 7.1 6.5 - 8.1 g/dL   Albumin 4.4 3.5 - 5.0 g/dL   AST 25 15 - 41 U/L   ALT 16 (L) 17 - 63 U/L   Alkaline Phosphatase 61 38 - 126 U/L   Total Bilirubin 0.8 0.3 - 1.2 mg/dL   GFR calc non Af Amer >60 >60 mL/min   GFR calc Af Amer >60 >60 mL/min    Comment: (NOTE) The eGFR has been calculated using the CKD EPI equation. This calculation has not been validated in all clinical situations. eGFR's persistently <60 mL/min signify possible Chronic Kidney Disease.    Anion gap 6 5 - 15  Ethanol     Status: None   Collection Time: 12/04/16 10:19 PM  Result Value Ref Range   Alcohol, Ethyl (B) <5 <5 mg/dL    Comment:        LOWEST DETECTABLE LIMIT FOR SERUM ALCOHOL IS 5 mg/dL FOR MEDICAL PURPOSES ONLY   Salicylate level     Status: None   Collection Time: 12/04/16 10:19 PM  Result Value Ref Range   Salicylate Lvl <8.8 2.8 - 30.0 mg/dL  Acetaminophen level     Status: Abnormal   Collection Time: 12/04/16 10:19 PM  Result Value Ref Range   Acetaminophen (Tylenol), Serum <10 (L) 10 - 30 ug/mL    Comment:        THERAPEUTIC CONCENTRATIONS VARY SIGNIFICANTLY. A  RANGE OF 10-30 ug/mL MAY BE AN EFFECTIVE CONCENTRATION FOR MANY PATIENTS. HOWEVER, SOME ARE BEST TREATED AT CONCENTRATIONS OUTSIDE THIS RANGE. ACETAMINOPHEN CONCENTRATIONS >150 ug/mL AT 4 HOURS AFTER INGESTION AND >50 ug/mL AT 12 HOURS AFTER INGESTION ARE OFTEN ASSOCIATED WITH TOXIC REACTIONS.   cbc     Status: None   Collection Time: 12/04/16 10:19 PM  Result Value Ref Range   WBC 6.8 3.8 - 10.6 K/uL   RBC 5.04 4.40 - 5.90 MIL/uL   Hemoglobin 15.4 13.0 - 18.0 g/dL   HCT 43.5 40.0 - 52.0 %   MCV 86.3 80.0 - 100.0 fL   MCH 30.5 26.0 - 34.0 pg   MCHC 35.3 32.0 - 36.0 g/dL   RDW 12.5 11.5 - 14.5 %   Platelets 292 150 - 440 K/uL  Urine Drug Screen, Qualitative  Status: None   Collection Time: 12/04/16 10:19 PM  Result Value Ref Range   Tricyclic, Ur Screen NONE DETECTED NONE DETECTED   Amphetamines, Ur Screen NONE DETECTED NONE DETECTED   MDMA (Ecstasy)Ur Screen NONE DETECTED NONE DETECTED   Cocaine Metabolite,Ur [place] NONE DETECTED NONE DETECTED   Opiate, Ur Screen NONE DETECTED NONE DETECTED   Phencyclidine (PCP) Ur S NONE DETECTED NONE DETECTED   Cannabinoid 50 Ng, Ur [place] NONE DETECTED NONE DETECTED   Barbiturates, Ur Screen NONE DETECTED NONE DETECTED   Benzodiazepine, Ur Scrn NONE DETECTED NONE DETECTED   Methadone Scn, Ur NONE DETECTED NONE DETECTED    Comment: (NOTE) 902  Tricyclics, urine               Cutoff 1000 ng/mL 200  Amphetamines, urine             Cutoff 1000 ng/mL 300  MDMA (Ecstasy), urine           Cutoff 500 ng/mL 400  Cocaine Metabolite, urine       Cutoff 300 ng/mL 500  Opiate, urine                   Cutoff 300 ng/mL 600  Phencyclidine (PCP), urine      Cutoff 25 ng/mL 700  Cannabinoid, urine              Cutoff 50 ng/mL 800  Barbiturates, urine             Cutoff 200 ng/mL 900  Benzodiazepine, urine           Cutoff 200 ng/mL 1000 Methadone, urine                Cutoff 300 ng/mL 1100 1200 The urine drug screen provides only a preliminary,  unconfirmed 1300 analytical test result and should not be used for non-medical 1400 purposes. Clinical consideration and professional judgment should 1500 be applied to any positive drug screen result due to possible 1600 interfering substances. A more specific alternate chemical method 1700 must be used in order to obtain a confirmed analytical result.  1800 Gas chromato graphy / mass spectrometry (GC/MS) is the preferred 1900 confirmatory method.     Current Facility-Administered Medications  Medication Dose Route Frequency Provider Last Rate Last Dose  . ARIPiprazole (ABILIFY) tablet 20 mg  20 mg Oral Daily Rudene Re, MD   20 mg at 12/05/16 0919  . fluvoxaMINE (LUVOX) tablet 100 mg  100 mg Oral QHS Rudene Re, MD   100 mg at 12/05/16 0154  . LORazepam (ATIVAN) tablet 2 mg  2 mg Oral QHS PRN Rudene Re, MD       Current Outpatient Prescriptions  Medication Sig Dispense Refill  . ARIPiprazole (ABILIFY) 20 MG tablet Take 1 tablet (20 mg total) by mouth daily. 30 tablet 0  . ARIPiprazole ER 400 MG SUSR Inject 400 mg into the muscle every 28 (twenty-eight) days. Due on January 19 1 each 1  . fluvoxaMINE (LUVOX) 100 MG tablet Take 1 tablet (100 mg total) by mouth at bedtime. 30 tablet 1  . LORazepam (ATIVAN) 2 MG tablet Take 1 tablet (2 mg total) by mouth at bedtime as needed for anxiety. 30 tablet 0    Musculoskeletal: Strength & Muscle Tone: within normal limits Gait & Station: normal Patient leans: N/A  Psychiatric Specialty Exam: Physical Exam  Nursing note and vitals reviewed. Constitutional: He appears well-developed and well-nourished.  HENT:  Head: Normocephalic and atraumatic.  Eyes: Conjunctivae are normal. Pupils are equal, round, and reactive to light.  Neck: Normal range of motion.  Cardiovascular: Regular rhythm and normal heart sounds.   Respiratory: Effort normal. No respiratory distress.  GI: Soft.  Musculoskeletal: Normal range of motion.   Neurological: He is alert.  Skin: Skin is warm and dry.  Psychiatric: His affect is blunt. His speech is delayed. He is slowed. Cognition and memory are normal. He expresses impulsivity. He expresses no homicidal and no suicidal ideation.    Review of Systems  Constitutional: Negative.   HENT: Negative.   Eyes: Negative.   Respiratory: Negative.   Cardiovascular: Negative.   Gastrointestinal: Negative.   Musculoskeletal: Negative.   Skin: Negative.   Neurological: Negative.   Psychiatric/Behavioral: Negative for depression, hallucinations, memory loss, substance abuse and suicidal ideas. The patient is nervous/anxious. The patient does not have insomnia.     Blood pressure 118/73, pulse 92, temperature 98.2 F (36.8 C), temperature source Oral, resp. rate 16, height '6\' 2"'  (1.88 m), weight 88.9 kg (196 lb), SpO2 99 %.Body mass index is 25.16 kg/m.  General Appearance: Casual  Eye Contact:  Minimal  Speech:  Slow  Volume:  Decreased  Mood:  Anxious  Affect:  Congruent  Thought Process:  Goal Directed  Orientation:  Full (Time, Place, and Person)  Thought Content:  Rumination  Suicidal Thoughts:  No  Homicidal Thoughts:  No  Memory:  Immediate;   Good Recent;   Fair Remote;   Fair  Judgement:  Impaired  Insight:  Fair  Psychomotor Activity:  Normal  Concentration:  Concentration: Fair  Recall:  [employer] of Knowledge:  Fair  Language:  Fair  Akathisia:  No  Handed:  Right  AIMS (if indicated):     Assets:  Desire for Improvement Financial Resources/Insurance Housing Physical Health Resilience Social Support  ADL's:  Intact  Cognition:  WNL  Sleep:        Treatment Plan Summary: Plan 31 year old man with schizophrenia. Not suicidal or homicidal. Taking care of himself adequately. Does not meet commitment criteria. Chronic anxiety which always prompted him to come right back to the hospital. Unlikely to benefit further from hospital stay. Supportive counseling  completed. Labs reviewed. Encouraged him to continue staying off of marijuana. He can be discharged back to his outpatient treatment plan.  Disposition: Patient does not meet criteria for psychiatric inpatient admission.  Alethia Berthold, MD 12/05/2016 4:33 PM

## 2017-02-15 ENCOUNTER — Emergency Department
Admission: EM | Admit: 2017-02-15 | Discharge: 2017-02-15 | Disposition: A | Discharge status: Home / Self Care | Payer: Medicare Other | Attending: Emergency Medicine | Admitting: Emergency Medicine

## 2017-02-15 DIAGNOSIS — F17210 Nicotine dependence, cigarettes, uncomplicated: Secondary | ICD-10-CM | POA: Diagnosis not present

## 2017-02-15 DIAGNOSIS — Z79899 Other long term (current) drug therapy: Secondary | ICD-10-CM | POA: Diagnosis not present

## 2017-02-15 DIAGNOSIS — F410 Panic disorder [episodic paroxysmal anxiety] without agoraphobia: Secondary | ICD-10-CM | POA: Diagnosis not present

## 2017-02-15 DIAGNOSIS — R0602 Shortness of breath: Secondary | ICD-10-CM | POA: Diagnosis present

## 2017-02-15 DIAGNOSIS — F1220 Cannabis dependence, uncomplicated: Secondary | ICD-10-CM | POA: Diagnosis present

## 2017-02-15 DIAGNOSIS — F4322 Adjustment disorder with anxiety: Secondary | ICD-10-CM | POA: Diagnosis not present

## 2017-02-15 DIAGNOSIS — F419 Anxiety disorder, unspecified: Secondary | ICD-10-CM

## 2017-02-15 LAB — URINE DRUG SCREEN, QUALITATIVE (ARMC ONLY)
Amphetamines, Ur Screen: NOT DETECTED
BARBITURATES, UR SCREEN: NOT DETECTED
BENZODIAZEPINE, UR SCRN: NOT DETECTED
COCAINE METABOLITE, UR ~~LOC~~: NOT DETECTED
Cannabinoid 50 Ng, Ur ~~LOC~~: NOT DETECTED
MDMA (Ecstasy)Ur Screen: NOT DETECTED
Methadone Scn, Ur: NOT DETECTED
OPIATE, UR SCREEN: NOT DETECTED
PHENCYCLIDINE (PCP) UR S: NOT DETECTED
Tricyclic, Ur Screen: NOT DETECTED

## 2017-02-15 LAB — ACETAMINOPHEN LEVEL: Acetaminophen (Tylenol), Serum: <10 ug/mL — ABNORMAL LOW (ref 10–30)

## 2017-02-15 LAB — COMPREHENSIVE METABOLIC PANEL
ALBUMIN: 4.7 g/dL (ref 3.5–5.0)
ALK PHOS: 64 U/L (ref 38–126)
ALT: 21 U/L (ref 17–63)
AST: 32 U/L (ref 15–41)
Anion gap: 11 (ref 5–15)
BILIRUBIN TOTAL: 0.7 mg/dL (ref 0.3–1.2)
BUN: 18 mg/dL (ref 6–20)
CALCIUM: 9.6 mg/dL (ref 8.9–10.3)
CO2: 27 mmol/L (ref 22–32)
Chloride: 101 mmol/L (ref 101–111)
Creatinine, Ser: 0.97 mg/dL (ref 0.61–1.24)
GFR calc Af Amer: >60 mL/min (ref >60)
GFR calc non Af Amer: >60 mL/min (ref >60)
GLUCOSE: 90 mg/dL (ref 65–99)
Potassium: 3.8 mmol/L (ref 3.5–5.1)
Sodium: 139 mmol/L (ref 135–145)
TOTAL PROTEIN: 7.5 g/dL (ref 6.5–8.1)

## 2017-02-15 LAB — SALICYLATE LEVEL: Salicylate Lvl: <7 mg/dL (ref 2.8–30.0)

## 2017-02-15 LAB — CBC
HEMATOCRIT: 45.5 % (ref 40.0–52.0)
HEMOGLOBIN: 16 g/dL (ref 13.0–18.0)
MCH: 30.3 pg (ref 26.0–34.0)
MCHC: 35.1 g/dL (ref 32.0–36.0)
MCV: 86.4 fL (ref 80.0–100.0)
Platelets: 298 10*3/uL (ref 150–440)
RBC: 5.26 MIL/uL (ref 4.40–5.90)
RDW: 13 % (ref 11.5–14.5)
WBC: 11.9 10*3/uL — ABNORMAL HIGH (ref 3.8–10.6)

## 2017-02-15 LAB — ETHANOL: Alcohol, Ethyl (B): <5 mg/dL (ref <5)

## 2017-02-15 NOTE — BH Assessment (Signed)
Assessment Note  Edwin Montes is an 31 y.o. male, with a history of schizophrenia, presenting to the ED with complaints of worsening symptoms of anxiety.  Pt has a hx of multiple ED visits for the same presentations.  Pt reports having his injection for the monthly but has not experienced a deceased in his anxiety level.  As with previous ED visits, patient reports not experiencing any triggers for his increase in anxiety levels.  Pt denies SI/HI and auditory/visual hallucinations.  He denies drug/alcohol use.  Pt reports that he keeps up with his appointments with his outpatient provider.  Diagnosis: Anxiety  Past Medical History:  Past Medical History:  Diagnosis Date  . Anxiety   . Schizophrenia [facility])     Past Surgical History:  Procedure Laterality Date  . NO PAST SURGERIES      Family History: No family history on file.  Social History:  reports that he has been smoking Cigarettes.  He has never used smokeless tobacco. He reports that he does not drink alcohol or use drugs.  Additional Social History:  Alcohol / Drug Use Pain Medications: See PTA Prescriptions: See PTA Over the Counter: See PTA  CIWA: CIWA-Ar BP: 122/77 Pulse Rate: (!) 106 COWS:    Allergies: No Known Allergies  Home Medications:  (Not in a hospital admission)  OB/GYN Status:  No LMP for male patient.  General Assessment Data Location of Assessment: [facility] ED TTS Assessment: In system Is this a Tele or Face-to-Face Assessment?: Face-to-Face Is this an Initial Assessment or a Re-assessment for this encounter?: Initial Assessment Marital status: Single Maiden name: na Is patient pregnant?: No Pregnancy Status: No Living Arrangements: Alone Can pt return to current living arrangement?: Yes Admission Status: Voluntary Is patient capable of signing voluntary admission?: Yes Referral Source: Self/Family/Friend Insurance type: Medicare     Crisis Care Plan Living Arrangements: Alone Legal  Guardian: Other: (self) Name of Psychiatrist: [facility] Name of Therapist: [facility]  Education Status Is patient currently in school?: No Current Grade: na Highest grade of school patient has completed: 12th Name of school: [employer] Contact person: na  Risk to self with the past 6 months Suicidal Ideation: No Has patient been a risk to self within the past 6 months prior to admission? : No Suicidal Intent: No Has patient had any suicidal intent within the past 6 months prior to admission? : No Is patient at risk for suicide?: No Suicidal Plan?: No Has patient had any suicidal plan within the past 6 months prior to admission? : No Access to Means: No What has been your use of drugs/alcohol within the last 12 months?: None reported Previous Attempts/Gestures: No How many times?: 0 Triggers for Past Attempts: None known Intentional Self Injurious Behavior: None Family Suicide History: No Recent stressful life event(s): Other (Comment) Persecutory voices/beliefs?: No Depression: No Substance abuse history and/or treatment for substance abuse?: No Suicide prevention information given to non-admitted patients: Not applicable  Risk to Others within the past 6 months Homicidal Ideation: No Does patient have any lifetime risk of violence toward others beyond the six months prior to admission? : No Thoughts of Harm to Others: No Current Homicidal Intent: No Current Homicidal Plan: No Access to Homicidal Means: No Identified Victim: None identified History of harm to others?: No Assessment of Violence: None Noted Does patient have access to weapons?: No Criminal Charges Pending?: No Does patient have a court date: No Is patient on probation?: No  Psychosis Hallucinations: None noted  Delusions: None noted  Mental Status Report Appearance/Hygiene: In scrubs Eye Contact: Fair Motor Activity: Freedom of movement Speech: Logical/coherent Level of Consciousness:  Quiet/awake Mood: Anxious Affect: Anxious Anxiety Level: Minimal Thought Processes: Relevant Judgement: Unimpaired Orientation: Person, Place, Time, Situation Obsessive Compulsive Thoughts/Behaviors: None  Cognitive Functioning Concentration: Good Memory: Recent Intact, Remote Intact IQ: Average Insight: Fair Impulse Control: Fair Appetite: Fair Sleep: No Change Vegetative Symptoms: None  ADLScreening [facility] Assessment Services) Patient's cognitive ability adequate to safely complete daily activities?: Yes Patient able to express need for assistance with ADLs?: Yes Independently performs ADLs?: Yes (appropriate for developmental age)  Prior Inpatient Therapy Prior Inpatient Therapy: Yes Prior Therapy Dates: 2017 Prior Therapy Facilty/Provider(s): [facility], [facility] Reason for Treatment: schizophrenia  Prior Outpatient Therapy Prior Outpatient Therapy: Yes Prior Therapy Dates: current Prior Therapy Facilty/Provider(s): [facility] Reason for Treatment: schizophrenia Does patient have an ACCT team?: No Does patient have Intensive In-House Services?  : No Does patient have [facility] services? : No Does patient have P4CC services?: No  ADL Screening (condition at time of admission) Patient's cognitive ability adequate to safely complete daily activities?: Yes Patient able to express need for assistance with ADLs?: Yes Independently performs ADLs?: Yes (appropriate for developmental age)       Abuse/Neglect Assessment (Assessment to be complete while patient is alone) Physical Abuse: Denies Verbal Abuse: Denies Sexual Abuse: Denies Exploitation of patient/patient's resources: Denies Self-Neglect: Denies Values / Beliefs Cultural Requests During Hospitalization: None Spiritual Requests During Hospitalization: None Consults Spiritual Care Consult Needed: No Social Work Consult Needed: No      Additional Information 1:1 In Past 12 Months?: No CIRT Risk: No Elopement  Risk: No Does patient have medical clearance?: Yes     Disposition:  Disposition Initial Assessment Completed for this Encounter: Yes Disposition of Patient: Other dispositions Other disposition(s): Other (Comment) (Pending Psych MD consult)  On Site Evaluation by:   Reviewed with Physician:    [place] 02/15/2017 5:18 AM

## 2017-02-15 NOTE — Consult Note (Signed)
[facility] Consult   Reason for Consult:  Consult for 31 year old man with schizophrenia who presented voluntarily complaining of anxiety Referring Physician:  Rifenbark Patient Identification: Edwin Montes MRN:  [id number] Principal Diagnosis: Adjustment disorder with anxiety Diagnosis:   Patient Active Problem List   Diagnosis Date Noted  . Adjustment disorder with anxiety [F43.22] 02/15/2017  . 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 Cannabis use disorder, moderate, dependence ([place]) [F12.20] 11/03/2016  . Panic attack [F41.0] 06/23/2016  . Noncompliance [Z91.19] 02/29/2016  . Tobacco use disorder [F17.200] 01/27/2016    Total Time spent with patient: 1 hour  Subjective:   Edwin Montes is a 31 y.o. male patient admitted with "I was feeling anxious".  HPI:  Patient interviewed. Chart reviewed. Patient known to me from previous encounters. 31 year old man with schizophrenia came to the emergency room voluntarily yesterday saying he was feeling anxious and panicky. He says that he was short of breath felt jittery and that his thoughts were racing. This all started yesterday morning possibly around the time that he was getting his long-acting injection. Patient denied that he was having any suicidal thoughts or wish to harm himself or harm anyone else. He is vague about his psychotic symptoms but says he is not actively having any hallucinations. Overall he seems to indicate that he's been functioning reasonably well at home. Denies any use of marijuana or other drugs recently. Doesn't report any other specific stressor that he can put his finger on.  Medical history: Really nothing significant outside of his schizophrenia. He does smoke.  Social history: Patient is currently living in an independent supervised apartment but hasn't act team. He goes to a day program regularly and get something out of it and enjoys it.  Substance abuse history: He has a  history of using marijuana to his detriment in the past but does not appear to be currently using any drugs or alcohol  Past Psychiatric History: History of schizophrenia. Positive previous hospitalizations. No previous suicide attempts or violence. Currently stabilized on medication and going to a day program. We have seen this patient several times previously in the emergency room under identical circumstances. He tends to get anxious and talk about having "racing thoughts" but not to appear to be grossly psychotic and never to appear to be acutely dangerous. I tried to talk with him today about some possible stresses but his insight is limited.  Risk to Self: Suicidal Ideation: No Suicidal Intent: No Is patient at risk for suicide?: No Suicidal Plan?: No Access to Means: No What has been your use of drugs/alcohol within the last 12 months?: None reported How many times?: 0 Triggers for Past Attempts: None known Intentional Self Injurious Behavior: None Risk to Others: Homicidal Ideation: No Thoughts of Harm to Others: No Current Homicidal Intent: No Current Homicidal Plan: No Access to Homicidal Means: No Identified Victim: None identified History of harm to others?: No Assessment of Violence: None Noted Does patient have access to weapons?: No Criminal Charges Pending?: No Does patient have a court date: No Prior Inpatient Therapy: Prior Inpatient Therapy: Yes Prior Therapy Dates: 2017 Prior Therapy Facilty/Provider(s): [facility], [facility] Reason for Treatment: schizophrenia Prior Outpatient Therapy: Prior Outpatient Therapy: Yes Prior Therapy Dates: current Prior Therapy Facilty/Provider(s): [facility] Reason for Treatment: schizophrenia Does patient have an ACCT team?: No Does patient have Intensive In-House Services?  : No Does patient have [facility] services? : No Does patient have P4CC services?: No  Past Medical History:  Past  Medical History:  Diagnosis Date  . Anxiety   .  Schizophrenia [facility])     Past Surgical History:  Procedure Laterality Date  . NO PAST SURGERIES     Family History: No family history on file. Family Psychiatric  History: None known Social History:  History  Alcohol Use No     History  Drug Use No    Social History   Social History  . Marital status: Single    Spouse name: N/A  . Number of children: N/A  . Years of education: N/A   Social History Main Topics  . Smoking status: Current Some Day Smoker    Types: Cigarettes  . Smokeless tobacco: Never Used  . Alcohol use No  . Drug use: No  . Sexual activity: No   Other Topics Concern  . None   Social History Narrative  . None   Additional Social History:    Allergies:  No Known Allergies  Labs:  Results for orders placed or performed during the hospital encounter of 02/15/17 (from the past 48 hour(s))  Comprehensive metabolic panel     Status: None   Collection Time: 02/15/17 12:31 AM  Result Value Ref Range   Sodium 139 135 - 145 mmol/L   Potassium 3.8 3.5 - 5.1 mmol/L   Chloride 101 101 - 111 mmol/L   CO2 27 22 - 32 mmol/L   Glucose, Bld 90 65 - 99 mg/dL   BUN 18 6 - 20 mg/dL   Creatinine, Ser 0.97 0.61 - 1.24 mg/dL   Calcium 9.6 8.9 - 10.3 mg/dL   Total Protein 7.5 6.5 - 8.1 g/dL   Albumin 4.7 3.5 - 5.0 g/dL   AST 32 15 - 41 U/L   ALT 21 17 - 63 U/L   Alkaline Phosphatase 64 38 - 126 U/L   Total Bilirubin 0.7 0.3 - 1.2 mg/dL   GFR calc non Af Amer >60 >60 mL/min   GFR calc Af Amer >60 >60 mL/min    Comment: (NOTE) The eGFR has been calculated using the CKD EPI equation. This calculation has not been validated in all clinical situations. eGFR's persistently <60 mL/min signify possible Chronic Kidney Disease.    Anion gap 11 5 - 15  Ethanol     Status: None   Collection Time: 02/15/17 12:31 AM  Result Value Ref Range   Alcohol, Ethyl (B) <5 <5 mg/dL    Comment:        LOWEST DETECTABLE LIMIT FOR SERUM ALCOHOL IS 5 mg/dL FOR MEDICAL PURPOSES  ONLY   Salicylate level     Status: None   Collection Time: 02/15/17 12:31 AM  Result Value Ref Range   Salicylate Lvl <3.0 2.8 - 30.0 mg/dL  Acetaminophen level     Status: Abnormal   Collection Time: 02/15/17 12:31 AM  Result Value Ref Range   Acetaminophen (Tylenol), Serum <10 (L) 10 - 30 ug/mL    Comment:        THERAPEUTIC CONCENTRATIONS VARY SIGNIFICANTLY. A RANGE OF 10-30 ug/mL MAY BE AN EFFECTIVE CONCENTRATION FOR MANY PATIENTS. HOWEVER, SOME ARE BEST TREATED AT CONCENTRATIONS OUTSIDE THIS RANGE. ACETAMINOPHEN CONCENTRATIONS >150 ug/mL AT 4 HOURS AFTER INGESTION AND >50 ug/mL AT 12 HOURS AFTER INGESTION ARE OFTEN ASSOCIATED WITH TOXIC REACTIONS.   cbc     Status: Abnormal   Collection Time: 02/15/17 12:31 AM  Result Value Ref Range   WBC 11.9 (H) 3.8 - 10.6 K/uL   RBC 5.26 4.40 - 5.90  MIL/uL   Hemoglobin 16.0 13.0 - 18.0 g/dL   HCT 45.5 40.0 - 52.0 %   MCV 86.4 80.0 - 100.0 fL   MCH 30.3 26.0 - 34.0 pg   MCHC 35.1 32.0 - 36.0 g/dL   RDW 13.0 11.5 - 14.5 %   Platelets 298 150 - 440 K/uL  Urine Drug Screen, Qualitative     Status: None   Collection Time: 02/15/17 12:31 AM  Result Value Ref Range   Tricyclic, Ur Screen NONE DETECTED NONE DETECTED   Amphetamines, Ur Screen NONE DETECTED NONE DETECTED   MDMA (Ecstasy)Ur Screen NONE DETECTED NONE DETECTED   Cocaine Metabolite,Ur [place] NONE DETECTED NONE DETECTED   Opiate, Ur Screen NONE DETECTED NONE DETECTED   Phencyclidine (PCP) Ur S NONE DETECTED NONE DETECTED   Cannabinoid 50 Ng, Ur [place] NONE DETECTED NONE DETECTED   Barbiturates, Ur Screen NONE DETECTED NONE DETECTED   Benzodiazepine, Ur Scrn NONE DETECTED NONE DETECTED   Methadone Scn, Ur NONE DETECTED NONE DETECTED    Comment: (NOTE) 416  Tricyclics, urine               Cutoff 1000 ng/mL 200  Amphetamines, urine             Cutoff 1000 ng/mL 300  MDMA (Ecstasy), urine           Cutoff 500 ng/mL 400  Cocaine Metabolite, urine       Cutoff 300 ng/mL 500   Opiate, urine                   Cutoff 300 ng/mL 600  Phencyclidine (PCP), urine      Cutoff 25 ng/mL 700  Cannabinoid, urine              Cutoff 50 ng/mL 800  Barbiturates, urine             Cutoff 200 ng/mL 900  Benzodiazepine, urine           Cutoff 200 ng/mL 1000 Methadone, urine                Cutoff 300 ng/mL 1100 1200 The urine drug screen provides only a preliminary, unconfirmed 1300 analytical test result and should not be used for non-medical 1400 purposes. Clinical consideration and professional judgment should 1500 be applied to any positive drug screen result due to possible 1600 interfering substances. A more specific alternate chemical method 1700 must be used in order to obtain a confirmed analytical result.  1800 Gas chromato graphy / mass spectrometry (GC/MS) is the preferred 1900 confirmatory method.     No current facility-administered medications for this encounter.    Current Outpatient Prescriptions  Medication Sig Dispense Refill  . ARIPiprazole (ABILIFY) 20 MG tablet Take 1 tablet (20 mg total) by mouth daily. 30 tablet 0  . ARIPiprazole ER 400 MG SUSR Inject 400 mg into the muscle every 28 (twenty-eight) days. Due on January 19 1 each 1  . fluvoxaMINE (LUVOX) 100 MG tablet Take 1 tablet (100 mg total) by mouth at bedtime. 30 tablet 1  . LORazepam (ATIVAN) 2 MG tablet Take 1 tablet (2 mg total) by mouth at bedtime as needed for anxiety. 30 tablet 0    Musculoskeletal: Strength & Muscle Tone: within normal limits Gait & Station: normal Patient leans: N/A  Psychiatric Specialty Exam: Physical Exam  Nursing note and vitals reviewed. Constitutional: He appears well-developed and well-nourished.  HENT:  Head: Normocephalic and atraumatic.  Eyes: Conjunctivae are  normal. Pupils are equal, round, and reactive to light.  Neck: Normal range of motion.  Cardiovascular: Regular rhythm and normal heart sounds.   Respiratory: Effort normal. No respiratory  distress.  GI: Soft.  Musculoskeletal: Normal range of motion.  Neurological: He is alert.  Skin: Skin is warm and dry.  Psychiatric: His mood appears anxious. His affect is blunt. His speech is delayed. He is slowed. Thought content is not paranoid. Cognition and memory are normal. He expresses impulsivity. He expresses no homicidal and no suicidal ideation.    Review of Systems  Constitutional: Negative.   HENT: Negative.   Eyes: Negative.   Respiratory: Negative.   Cardiovascular: Negative.   Gastrointestinal: Negative.   Musculoskeletal: Negative.   Skin: Negative.   Neurological: Negative.   Psychiatric/Behavioral: Negative for depression, hallucinations, memory loss, substance abuse and suicidal ideas. The patient is nervous/anxious. The patient does not have insomnia.     Blood pressure 111/65, pulse 83, temperature 98.2 F (36.8 C), temperature source Oral, resp. rate 16, height _0  (1.88 m), weight 97.1 kg (214 lb), SpO2 99 %.Body mass index is 27.48 kg/m.  General Appearance: Casual  Eye Contact:  Fair  Speech:  Slow  Volume:  Decreased  Mood:  Euthymic  Affect:  Blunt  Thought Process:  Goal Directed  Orientation:  Full (Time, Place, and Person)  Thought Content:  Rumination  Suicidal Thoughts:  No  Homicidal Thoughts:  No  Memory:  Immediate;   Good Recent;   Fair  Judgement:  Fair  Insight:  Fair  Psychomotor Activity:  Decreased  Concentration:  Concentration: Fair  Recall:  [employer] of Knowledge:  Fair  Language:  Fair  Akathisia:  No  Handed:  Right  AIMS (if indicated):     Assets:  Desire for Improvement Financial Resources/Insurance Housing Physical Health Resilience Social Support  ADL's:  Intact  Cognition:  Impaired,  Mild  Sleep:        Treatment Plan Summary: Plan 31 year old man with schizophrenia. Came into the hospital yesterday with anxiety but not suicidal not violent not dangerous not threatening and not really actively  psychotic. He is asking if he might possibly be admitted to the psychiatric ward but I have told him as before that there is really no need for that. His current level of symptoms don't warrant inpatient treatment. He has very good outpatient treatment with a day program and an act team and has been compliant with medicine. Patient is accepting of the plan and can be released from the emergency room and go back to his apartment with regular follow-up in the community.  Disposition: Patient does not meet criteria for psychiatric inpatient admission. Supportive therapy provided about ongoing stressors.  Alethia Berthold, MD 02/15/2017 12:19 PM

## 2017-02-15 NOTE — ED Provider Notes (Signed)
[name] Emergency Department Provider Note  ____________________________________________   First MD Initiated Contact with Patient 02/15/17 0217     (approximate)  I have reviewed the triage vital signs and the nursing notes.   HISTORY  Chief Complaint Psychiatric Evaluation    HPI Edwin Montes is a 31 y.o. male with a history of anxiety and schizophrenia.  He gets monthly injections and does think that his symptoms started to get a little bit worse around the time of the injection.  He got his injection yesterday but felt like he was having a panic attack with some mild shortness of breath and chest discomfort that feels similar to his prior episodes.  He got better after coming to the emergency department and practicing some calming exercises.  He has no other medical issues and feels fine at this time although he still remains somewhat anxious.  He denies suicidal ideation and homicidal ideation.  He describes his symptoms as moderate to severe.   Past Medical History:  Diagnosis Date  . Anxiety   . Schizophrenia [facility])     Patient Active Problem List   Diagnosis Date Noted  . 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 Cannabis use disorder, moderate, dependence ([facility]) 11/03/2016  . Panic attack 06/23/2016  . Noncompliance 02/29/2016  . Tobacco use disorder 01/27/2016    Past Surgical History:  Procedure Laterality Date  . NO PAST SURGERIES      Prior to Admission medications   Medication Sig Start Date End Date Taking? Authorizing Provider  ARIPiprazole (ABILIFY) 20 MG tablet Take 1 tablet (20 mg total) by mouth daily. 11/04/16   Shari Prows, MD  ARIPiprazole ER 400 MG SUSR Inject 400 mg into the muscle every 28 (twenty-eight) days. Due on January 19 12/01/16   Jimmy Footman, MD  fluvoxaMINE (LUVOX) 100 MG tablet Take 1 tablet (100 mg total) by mouth at bedtime. 11/04/16   Shari Prows, MD  LORazepam  (ATIVAN) 2 MG tablet Take 1 tablet (2 mg total) by mouth at bedtime as needed for anxiety. 11/05/16   Jimmy Footman, MD    Allergies Patient has no known allergies.  No family history on file.  Social History Social History  Substance Use Topics  . Smoking status: Current Some Day Smoker    Types: Cigarettes  . Smokeless tobacco: Never Used  . Alcohol use No    Review of Systems Constitutional: No fever/chills Eyes: No visual changes. ENT: No sore throat. Cardiovascular: Denies chest pain. Some discomfort earlier when he was feeling anxious Respiratory: Mild shortness of breath earlier when feeling anxious and like he was having a panic attack Gastrointestinal: No abdominal pain.  No nausea, no vomiting.  No diarrhea.  No constipation. Genitourinary: Negative for dysuria. Musculoskeletal: Negative for back pain. Skin: Negative for rash. Neurological: Negative for headaches, focal weakness or numbness. Psychiatric:Anxious, denies SI/HI 10-point ROS otherwise negative.  ____________________________________________   PHYSICAL EXAM:  VITAL SIGNS: ED Triage Vitals  Enc Vitals Group     BP 02/15/17 0028 122/77     Pulse Rate 02/15/17 0028 (!) 106     Resp 02/15/17 0028 18     Temp 02/15/17 0028 97.8 F (36.6 C)     Temp Source 02/15/17 0028 Oral     SpO2 02/15/17 0028 96 %     Weight 02/15/17 0029 214 lb (97.1 kg)     Height 02/15/17 0029  (1.88 m)     Head Circumference --  Peak Flow --      Pain Score --      Pain Loc --      Pain Edu? --      Excl. in GC? --     Constitutional: Alert and oriented. Well appearing and in no acute distress. Eyes: Conjunctivae are normal. PERRL. EOMI. Head: Atraumatic. Nose: No congestion/rhinnorhea. Mouth/Throat: Mucous membranes are moist. Neck: No stridor.  No meningeal signs.   Cardiovascular: Normal rate, regular rhythm. Good peripheral circulation. Grossly normal heart sounds. Respiratory: Normal  respiratory effort.  No retractions. Lungs CTAB. Gastrointestinal: Soft and nontender. No distention.  Musculoskeletal: No lower extremity tenderness nor edema. No gross deformities of extremities. Neurologic:  Normal speech and language. No gross focal neurologic deficits are appreciated.  Skin:  Skin is warm, dry and intact. No rash noted. Psychiatric: Mood and affect are normal. Speech and behavior are normal.  Denies SI/HI.  Still feels somewhat anxious but is calm and cooperative  ____________________________________________   LABS (all labs ordered are listed, but only abnormal results are displayed)  Labs Reviewed  ACETAMINOPHEN LEVEL - Abnormal; Notable for the following:       Result Value   Acetaminophen (Tylenol), Serum <10 (*)    All other components within normal limits  CBC - Abnormal; Notable for the following:    WBC 11.9 (*)    All other components within normal limits  COMPREHENSIVE METABOLIC PANEL  ETHANOL  SALICYLATE LEVEL  URINE DRUG SCREEN, QUALITATIVE ([facility] ONLY)   ____________________________________________  EKG  None - EKG not ordered by ED physician ____________________________________________  RADIOLOGY   No results found.  ____________________________________________   PROCEDURES  Critical Care performed: No   Procedure(s) performed:   Procedures   ____________________________________________   INITIAL IMPRESSION / ASSESSMENT AND PLAN / ED COURSE  Pertinent labs & imaging results that were available during my care of the patient were reviewed by me and considered in my medical decision making (see chart for details).  The patient is voluntary and does not meet any criteria for involuntary commitment.  He is feeling better at this time and has been adherent to his medication regimen and just got a shot less than 24 hours ago of his monthly aripiprazole.  However he still feels anxious.  He follows up at [facility] and I gave him the  option of going home and getting a good night's rest and following up with [facility] were staying in speaking with psychiatry.  He feels like it would be safer for him if he stayed and spoke with psychiatry.  He is voluntary at this time and medically cleared to go to the [facility].      ____________________________________________  FINAL CLINICAL IMPRESSION(S) / ED DIAGNOSES  Final diagnoses:  Anxiety  Panic attack     MEDICATIONS GIVEN DURING THIS VISIT:  Medications - No data to display   NEW OUTPATIENT MEDICATIONS STARTED DURING THIS VISIT:  New Prescriptions   No medications on file    Modified Medications   No medications on file    Discontinued Medications   No medications on file     Note:  This document was prepared using Dragon voice recognition software and may include unintentional dictation errors.    Loleta Rose, MD 02/15/17 [phone]

## 2017-02-15 NOTE — ED Triage Notes (Signed)
Per EMS, pt called out for EMS for panic attack/depression.  Pt told EMS that he averages taking his PO meds approx 1xmonth.  Pt also stated that he got a shot today for his schizophrenia, but could not recall. Per EMS vitals: 96% RA, 149/87 HR 112.  Pt denies SI/HI at this time.

## 2017-02-15 NOTE — ED Notes (Signed)
Pt calm and cooperative. Denies SI/HI and AVH. Maintained on 15 minute checks and observation by security camera for safety.

## 2017-02-15 NOTE — ED Notes (Signed)
Introduced self to pt, who denied needs/concerns. Will continue to monitor for needs/safety.

## 2017-02-15 NOTE — Discharge Instructions (Signed)
Please follow up with your PMD and psychiatrist as needed and return to the ED for any concerns.

## 2017-02-15 NOTE — ED Notes (Signed)
Pt discharged in NAD to cab. Voucher given to driver. AVS given/reviewed with pt. Pt given opportunity to ask questions and had none. Pt given belongings.

## 2017-02-22 ENCOUNTER — Emergency Department: Payer: Medicare Other

## 2017-02-22 ENCOUNTER — Emergency Department
Admission: EM | Admit: 2017-02-22 | Discharge: 2017-02-22 | Disposition: A | Discharge status: Home / Self Care | Payer: Medicare Other | Attending: Emergency Medicine | Admitting: Emergency Medicine

## 2017-02-22 DIAGNOSIS — R0600 Dyspnea, unspecified: Secondary | ICD-10-CM

## 2017-02-22 DIAGNOSIS — F17210 Nicotine dependence, cigarettes, uncomplicated: Secondary | ICD-10-CM | POA: Diagnosis not present

## 2017-02-22 DIAGNOSIS — F419 Anxiety disorder, unspecified: Secondary | ICD-10-CM

## 2017-02-22 DIAGNOSIS — R0602 Shortness of breath: Secondary | ICD-10-CM | POA: Diagnosis present

## 2017-02-22 DIAGNOSIS — F410 Panic disorder [episodic paroxysmal anxiety] without agoraphobia: Secondary | ICD-10-CM | POA: Diagnosis not present

## 2017-02-22 LAB — CBC
HEMATOCRIT: 45 % (ref 40.0–52.0)
Hemoglobin: 15.6 g/dL (ref 13.0–18.0)
MCH: 30.7 pg (ref 26.0–34.0)
MCHC: 34.6 g/dL (ref 32.0–36.0)
MCV: 88.7 fL (ref 80.0–100.0)
Platelets: 276 10*3/uL (ref 150–440)
RBC: 5.08 MIL/uL (ref 4.40–5.90)
RDW: 13.5 % (ref 11.5–14.5)
WBC: 7.8 10*3/uL (ref 3.8–10.6)

## 2017-02-22 LAB — BASIC METABOLIC PANEL
Anion gap: 6 (ref 5–15)
BUN: 16 mg/dL (ref 6–20)
CALCIUM: 9.5 mg/dL (ref 8.9–10.3)
CO2: 27 mmol/L (ref 22–32)
Chloride: 105 mmol/L (ref 101–111)
Creatinine, Ser: 0.96 mg/dL (ref 0.61–1.24)
GFR calc Af Amer: >60 mL/min (ref >60)
GLUCOSE: 104 mg/dL — AB (ref 65–99)
POTASSIUM: 3.8 mmol/L (ref 3.5–5.1)
Sodium: 138 mmol/L (ref 135–145)

## 2017-02-22 LAB — TROPONIN I: Troponin I: <0.03 ng/mL (ref <0.03)

## 2017-02-22 NOTE — ED Triage Notes (Addendum)
Pt brought in by ems with sob and anxiety.  Pt reports he feels sob and has had racing thoughts.  Pt denies chest pain.   Denies si or hi.  Pt denies drug or etoh use.   Pt alert.  Speech clear.  Pt calm and cooperative.

## 2017-02-22 NOTE — ED Notes (Signed)
Patient states "I attend group care during the day and I started feeling anxiety and sob when I got home. I do not feel SOB any longer" Denies HI/SI. Since he has been waiting feels that his symptoms have resolved

## 2017-02-22 NOTE — ED Provider Notes (Signed)
[facility] Emergency Department Provider Note  ____________________________________________  Time seen: Approximately 9:55 PM  I have reviewed the triage vital signs and the nursing notes.   HISTORY  Chief Complaint Anxiety and Shortness of Breath    HPI Edwin Montes is a 31 y.o. male her reports a rapid onset of shortness of breath and anxiety and feeling overwhelmed. Denies any triggering event. No recent illness. Compliant with his medications. No drug or alcohol use. He still smokes and is thinking about cutting back but has not changed his tobacco or nicotine intake significantly recently. Last had his monthly aripiprazole injection about 8 days ago. He is actually feeling much better now since arrival in the emergency department. Feels like he is back to normal. Denies chest pain or shortness of breath at present time. No cough or fever.  No visual or auditory hallucinations. No SI or HI.   Past Medical History:  Diagnosis Date  . Anxiety   . Schizophrenia [facility])      Patient Active Problem List   Diagnosis Date Noted  . Adjustment disorder with anxiety 02/15/2017  . 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 Cannabis use disorder, moderate, dependence (HCC) 11/03/2016  . Panic attack 06/23/2016  . Noncompliance 02/29/2016  . Tobacco use disorder 01/27/2016     Past Surgical History:  Procedure Laterality Date  . NO PAST SURGERIES       Prior to Admission medications   Medication Sig Start Date End Date Taking? Authorizing Provider  ARIPiprazole (ABILIFY) 20 MG tablet Take 1 tablet (20 mg total) by mouth daily. 11/04/16   Shari Prows, MD  ARIPiprazole ER 400 MG SUSR Inject 400 mg into the muscle every 28 (twenty-eight) days. Due on January 19 12/01/16   Jimmy Footman, MD  fluvoxaMINE (LUVOX) 100 MG tablet Take 1 tablet (100 mg total) by mouth at bedtime. 11/04/16   Shari Prows, MD  LORazepam  (ATIVAN) 2 MG tablet Take 1 tablet (2 mg total) by mouth at bedtime as needed for anxiety. 11/05/16   Jimmy Footman, MD     Allergies Patient has no known allergies.   No family history on file.  Social History Social History  Substance Use Topics  . Smoking status: Current Some Day Smoker    Types: Cigarettes  . Smokeless tobacco: Never Used  . Alcohol use No    Review of Systems  Constitutional:   No fever or chills.  ENT:   No sore throat. No rhinorrhea. Cardiovascular:   No chest pain. Respiratory:   Shortness of breath earlier today, now resolved. Gastrointestinal:   Negative for abdominal pain, vomiting and diarrhea.  Musculoskeletal:   Negative for focal pain or swelling Neurological:   Negative for headaches 10-point ROS otherwise negative.  ____________________________________________   PHYSICAL EXAM:  VITAL SIGNS: ED Triage Vitals  Enc Vitals Group     BP 02/22/17 1958 126/76     Pulse Rate 02/22/17 1958 97     Resp 02/22/17 1958 20     Temp 02/22/17 1958 98.6 F (37 C)     Temp Source 02/22/17 1958 Oral     SpO2 02/22/17 1958 99 %     Weight 02/22/17 1959 214 lb (97.1 kg)     Height 02/22/17 1959  (1.88 m)     Head Circumference --      Peak Flow --      Pain Score --      Pain Loc --  Pain Edu? --      Excl. in GC? --     Vital signs reviewed, nursing assessments reviewed.   Constitutional:   Alert and oriented. Well appearing and in no distress. Eyes:   No scleral icterus. No conjunctival pallor. PERRL. EOMI.  No nystagmus. ENT   Head:   Normocephalic and atraumatic.   Nose:   No congestion/rhinnorhea. No septal hematoma   Mouth/Throat:   MMM, no pharyngeal erythema. No peritonsillar mass.    Neck:   No stridor. No SubQ emphysema. No meningismus. Hematological/Lymphatic/Immunilogical:   No cervical lymphadenopathy. Cardiovascular:   RRR. Symmetric bilateral radial and DP pulses.  No murmurs.  Respiratory:    Normal respiratory effort without tachypnea nor retractions. Breath sounds are clear and equal bilaterally. No wheezes/rales/rhonchi.No inducible bronchospasm or wheezing with forceful expiration. Gastrointestinal:   Soft and nontender. Non distended. There is no CVA tenderness.  No rebound, rigidity, or guarding. Genitourinary:   deferred Musculoskeletal:   Normal range of motion in all extremities. No joint effusions.  No lower extremity tenderness.  No edema. Neurologic:   Normal speech and language.  Linear thought process CN 2-10 normal. Motor grossly intact. No gross focal neurologic deficits are appreciated.   ____________________________________________    LABS (pertinent positives/negatives) (all labs ordered are listed, but only abnormal results are displayed) Labs Reviewed  BASIC METABOLIC PANEL - Abnormal; Notable for the following:       Result Value   Glucose, Bld 104 (*)    All other components within normal limits  CBC  TROPONIN I   ____________________________________________   EKG  Interpreted by me  Date: 02/22/2017  Rate: 80  Rhythm: normal sinus rhythm  QRS Axis: normal  Intervals: normal  ST/T Wave abnormalities: normal  Conduction Disutrbances: none  Narrative Interpretation: unremarkable      ____________________________________________    RADIOLOGY  Dg Chest 2 View  Result Date: 02/22/2017 CLINICAL DATA:  Shortness of breath and anxiety. EXAM: CHEST  2 VIEW COMPARISON:  None. FINDINGS: Cardiomediastinal silhouette is normal. No pleural effusions or focal consolidations. Trachea projects midline and there is no pneumothorax. Soft tissue planes and included osseous structures are non-suspicious. IMPRESSION: Normal chest. Electronically Signed   By: Awilda Metro M.D.   On: 02/22/2017 20:37    ____________________________________________   PROCEDURES Procedures  ____________________________________________   INITIAL IMPRESSION  / ASSESSMENT AND PLAN / ED COURSE  Pertinent labs & imaging results that were available during my care of the patient were reviewed by me and considered in my medical decision making (see chart for details).  Patient well appearing no acute distress, presents with shortness of breath and anxiety symptoms, now resolved. This does appear to be entirely consistent with his anxiety disorder and panic attacks. Patient is feeling much better and wants to go home. He follows up with [facility] and I encouraged him to continue using his as needed medications and to follow closely with [facility]. Return precautions given. Considering the patient's symptoms, medical history, and physical examination today, I have low suspicion for ACS, PE, TAD, pneumothorax, carditis, mediastinitis, pneumonia, CHF, or sepsis.           ____________________________________________   FINAL CLINICAL IMPRESSION(S) / ED DIAGNOSES  Final diagnoses:  Anxiety  Panic attack  Dyspnea, unspecified type      New Prescriptions   No medications on file     Portions of this note were generated with dragon dictation software. Dictation errors may occur despite best attempts at proofreading.  Sharman Cheek, MD 02/22/17 2159

## 2017-03-23 ENCOUNTER — Emergency Department
Admission: EM | Admit: 2017-03-23 | Discharge: 2017-03-24 | Disposition: A | Discharge status: Other Acute Inpatient Hospital | Payer: Medicare Other | Attending: Emergency Medicine | Admitting: Emergency Medicine

## 2017-03-23 ENCOUNTER — Encounter: Payer: Self-pay | Admitting: Emergency Medicine

## 2017-03-23 DIAGNOSIS — F17210 Nicotine dependence, cigarettes, uncomplicated: Secondary | ICD-10-CM | POA: Diagnosis not present

## 2017-03-23 DIAGNOSIS — F209 Schizophrenia, unspecified: Secondary | ICD-10-CM | POA: Diagnosis not present

## 2017-03-23 DIAGNOSIS — R443 Hallucinations, unspecified: Secondary | ICD-10-CM

## 2017-03-23 LAB — CBC
HCT: 46.6 % (ref 40.0–52.0)
Hemoglobin: 16 g/dL (ref 13.0–18.0)
MCH: 30.3 pg (ref 26.0–34.0)
MCHC: 34.3 g/dL (ref 32.0–36.0)
MCV: 88.2 fL (ref 80.0–100.0)
PLATELETS: 294 10*3/uL (ref 150–440)
RBC: 5.28 MIL/uL (ref 4.40–5.90)
RDW: 13 % (ref 11.5–14.5)
WBC: 8.6 10*3/uL (ref 3.8–10.6)

## 2017-03-23 LAB — COMPREHENSIVE METABOLIC PANEL
ALT: 18 U/L (ref 17–63)
ANION GAP: 9 (ref 5–15)
AST: 29 U/L (ref 15–41)
Albumin: 4.8 g/dL (ref 3.5–5.0)
Alkaline Phosphatase: 63 U/L (ref 38–126)
BILIRUBIN TOTAL: 1 mg/dL (ref 0.3–1.2)
BUN: 18 mg/dL (ref 6–20)
CHLORIDE: 102 mmol/L (ref 101–111)
CO2: 28 mmol/L (ref 22–32)
Calcium: 9.2 mg/dL (ref 8.9–10.3)
Creatinine, Ser: 1.09 mg/dL (ref 0.61–1.24)
Glucose, Bld: 78 mg/dL (ref 65–99)
POTASSIUM: 4.1 mmol/L (ref 3.5–5.1)
Sodium: 139 mmol/L (ref 135–145)
TOTAL PROTEIN: 7.6 g/dL (ref 6.5–8.1)

## 2017-03-23 LAB — ETHANOL

## 2017-03-23 LAB — ACETAMINOPHEN LEVEL

## 2017-03-23 LAB — SALICYLATE LEVEL

## 2017-03-23 NOTE — ED Notes (Signed)
ENVIRONMENTAL ASSESSMENT  Potentially harmful objects out of patient reach: Yes.  Personal belongings secured: Yes.  Patient dressed in hospital provided attire only: Yes.  Plastic bags out of patient reach: Yes.  Patient care equipment (cords, cables, call bells, lines, and drains) shortened, removed, or accounted for: Yes.  Equipment and supplies removed from bottom of stretcher: Yes.  Potentially toxic materials out of patient reach: Yes.  Sharps container removed or out of patient reach: Yes.   BEHAVIORAL HEALTH ROUNDING  Patient sleeping: No.  Patient alert and oriented: yes  Behavior appropriate: Yes. ; If no, describe:  Nutrition and fluids offered: Yes  Toileting and hygiene offered: Yes  Sitter present: not applicable, Q 15 min safety rounds and observation via security camera. Law enforcement present: Yes ODS  Pt brought into ED BHU via sally port and wand with metal detector for safety by ODS officer. Patient oriented to unit/care area: Pt informed of unit policies and procedures.  Informed that, for their safety, care areas are designed for safety and monitored by security cameras at all times; and visiting hours explained to patient. Patient verbalizes understanding, and verbal contract for safety obtained.Pt shown to their room.    

## 2017-03-23 NOTE — BH Assessment (Signed)
Assessment Note  Edwin Montes is an 31 y.o. male. Edwin Montes arrived to the ED by way of EMS.   He reports that it is time to clear his mind so he should come to the hospital.  He reports that he is feeling anxious.  He reports that he has problems focusing. He denied symptoms of depression. He denied having auditory or visual hallucinations.  When questioned about the statements he made on arrival, he stated, "It's not happening as much as it was". He reports that he has been seeing the shadows for about 2.5 weeks. He denied having suicidal ideation or intent.  He denied having homicidal thoughts or intent.  He denied the use of alcohol or drugs.  He denied any additional stressors.  He reports that he did not mention the shadows to his ACT team.  Diagnosis: Schizophrenia  Past Medical History:  Past Medical History:  Diagnosis Date  . Anxiety   . Schizophrenia [facility])     Past Surgical History:  Procedure Laterality Date  . NO PAST SURGERIES      Family History: History reviewed. No pertinent family history.  Social History:  reports that he has been smoking Cigarettes.  He has been smoking about 0.50 packs per day. He has never used smokeless tobacco. He reports that he does not drink alcohol or use drugs.  Additional Social History:  Alcohol / Drug Use History of alcohol / drug use?: No history of alcohol / drug abuse (Patient denied use)  CIWA: CIWA-Ar BP: 118/74 Pulse Rate: 91 COWS:    Allergies: No Known Allergies  Home Medications:  (Not in a hospital admission)  OB/GYN Status:  No LMP for male patient.  General Assessment Data Location of Assessment: [facility] ED TTS Assessment: In system Is this a Tele or Face-to-Face Assessment?: Face-to-Face Is this an Initial Assessment or a Re-assessment for this encounter?: Initial Assessment Marital status: Single Maiden name: n/a Is patient pregnant?: No Pregnancy Status: No Living Arrangements: Alone Can pt return to current  living arrangement?: Yes Admission Status: Voluntary Is patient capable of signing voluntary admission?: Yes Referral Source: Self/Family/Friend Insurance type: Medicaid/Medicare  Medical Screening Exam [facility] Walk-in ONLY) Medical Exam completed: Yes  Crisis Care Plan Living Arrangements: Alone Legal Guardian: Other: (Self) Name of Psychiatrist: [facility] - ACT team Name of Therapist: [facility]- ACT team  Education Status Is patient currently in school?: No Current Grade: n/a Highest grade of school patient has completed: 12th Name of school: [employer] person: n/a  Risk to self with the past 6 months Suicidal Ideation: No Has patient been a risk to self within the past 6 months prior to admission? : No Suicidal Intent: No Has patient had any suicidal intent within the past 6 months prior to admission? : No Is patient at risk for suicide?: No Suicidal Plan?: No Has patient had any suicidal plan within the past 6 months prior to admission? : No Access to Means: No What has been your use of drugs/alcohol within the last 12 months?: Denied use Previous Attempts/Gestures: No How many times?: 0 Other Self Harm Risks: denied Triggers for Past Attempts: None known Intentional Self Injurious Behavior: None Family Suicide History: No Recent stressful life event(s):  (denied) Persecutory voices/beliefs?: No Depression: No Depression Symptoms:  (denied) Substance abuse history and/or treatment for substance abuse?: No Suicide prevention information given to non-admitted patients: Not applicable  Risk to Others within the past 6 months Homicidal Ideation: No Does patient have any lifetime  risk of violence toward others beyond the six months prior to admission? : No Thoughts of Harm to Others: No Current Homicidal Intent: No Current Homicidal Plan: No Access to Homicidal Means: No Identified Victim: None identified History of harm to others?: No Assessment of Violence: None  Noted Does patient have access to weapons?: No Criminal Charges Pending?: No Does patient have a court date: No Is patient on probation?: No  Psychosis Hallucinations: Visual Delusions: None noted  Mental Status Report Appearance/Hygiene: In scrubs Eye Contact: Poor Motor Activity: Unremarkable Speech: Logical/coherent Level of Consciousness: Alert Mood: Suspicious Affect: Irritable Anxiety Level: Minimal Thought Processes: Coherent Judgement: Partial Orientation: Time, Place, Situation Obsessive Compulsive Thoughts/Behaviors: None  Cognitive Functioning Concentration: Fair Memory: Recent Intact IQ: Average Insight: Poor Impulse Control: Fair Appetite: Fair Sleep: No Change Vegetative Symptoms: None  ADLScreening [facility] Assessment Services) Patient's cognitive ability adequate to safely complete daily activities?: Yes Patient able to express need for assistance with ADLs?: Yes Independently performs ADLs?: Yes (appropriate for developmental age)  Prior Inpatient Therapy Prior Inpatient Therapy: Yes Prior Therapy Dates: 2008 Prior Therapy Facilty/Provider(s): [facility],  Reason for Treatment: Unsure  Prior Outpatient Therapy Prior Outpatient Therapy: Yes Prior Therapy Dates: Current Prior Therapy Facilty/Provider(s): [facility] -ACT team Reason for Treatment: Schizophrenia Does patient have an ACCT team?: Yes Does patient have Intensive In-House Services?  : No Does patient have [facility] services? : No Does patient have P4CC services?: No  ADL Screening (condition at time of admission) Patient's cognitive ability adequate to safely complete daily activities?: Yes Patient able to express need for assistance with ADLs?: Yes Independently performs ADLs?: Yes (appropriate for developmental age)       Abuse/Neglect Assessment (Assessment to be complete while patient is alone) Physical Abuse: Denies Verbal Abuse: Denies Sexual Abuse: Denies Exploitation of  patient/patient's resources: Denies Self-Neglect: Denies     Merchant navy officerAdvance Directives (For Healthcare) Does Patient Have a Medical Advance Directive?: No Would patient like information on creating a medical advance directive?: No - Patient declined    Additional Information 1:1 In Past 12 Months?: No CIRT Risk: No Elopement Risk: No Does patient have medical clearance?: Yes     Disposition:  Disposition Initial Assessment Completed for this Encounter: Yes Disposition of Patient: Other dispositions  On Site Evaluation by:   Reviewed with Physician:    Justice DeedsKeisha Solon Alban 03/23/2017 10:50 PM

## 2017-03-23 NOTE — ED Triage Notes (Addendum)
Patient presents to Emergency Department via [facility] with complaints of hallucinations of seeing  shadows in the room for the last two hours.    Pt reports regular shots but is supposed to be taking Abilify daily and is skipping some doses, 2 -3 visits per week by [address] administrator Chales Abrahams(Tyson) for wellness checks.  Pt blunted/restricted affect and delayed response to questions.  Pt denies auditory hallucinations and denies SI/HI.

## 2017-03-23 NOTE — ED Provider Notes (Signed)
[facility] Emergency Department Provider Note       Time seen: ----------------------------------------- 9:20 PM on 03/23/2017 -----------------------------------------     I have reviewed the triage vital signs and the nursing notes.   HISTORY   Chief Complaint Altered Mental Status and Hallucinations    HPI Edwin RidgelCameron Montes is a 31 y.o. male who presents to the ED for hallucinations of seeing shadows in the room for the last 2 hours. Patient typically takes shots and is supposed to be taking Abilify daily but he has skipped some doses. He's had a very flat affect and appears to be hallucinating but he denies this. He denies suicidal or homicidal ideations. Patient states he feels like he needs to talk to psychiatry.   Past Medical History:  Diagnosis Date  . Anxiety   . Schizophrenia [facility](HCC)     Patient Active Problem List   Diagnosis Date Noted  . Adjustment disorder with anxiety 02/15/2017  . 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 Cannabis use disorder, moderate, dependence (HCC) 11/03/2016  . Panic attack 06/23/2016  . Noncompliance 02/29/2016  . Tobacco use disorder 01/27/2016    Past Surgical History:  Procedure Laterality Date  . NO PAST SURGERIES      Allergies Patient has no known allergies.  Social History Social History  Substance Use Topics  . Smoking status: Current Some Day Smoker    Packs/day: 0.50    Types: Cigarettes  . Smokeless tobacco: Never Used  . Alcohol use No    Review of Systems Constitutional: Negative for fever. Eyes: Negative for vision changes ENT:  Negative for congestion, sore throat Cardiovascular: Negative for chest pain. Respiratory: Negative for shortness of breath. Gastrointestinal: Negative for abdominal pain, vomiting and diarrhea. Skin: Negative for rash. Neurological: Negative for headaches, focal weakness or numbness. Psychiatric: Positive for hallucinations, negative for  suicidal or homicidal ideation  All systems negative/normal/unremarkable except as stated in the HPI  ____________________________________________   PHYSICAL EXAM:  VITAL SIGNS: ED Triage Vitals  Enc Vitals Group     BP 03/23/17 2043 118/74     Pulse Rate 03/23/17 2043 91     Resp 03/23/17 2043 18     Temp 03/23/17 2043 98 F (36.7 C)     Temp Source 03/23/17 2043 Oral     SpO2 03/23/17 2043 98 %     Weight 03/23/17 2041 214 lb (97.1 kg)     Height 03/23/17 2041 6\' 2"  (1.88 m)     Head Circumference --      Peak Flow --      Pain Score --      Pain Loc --      Pain Edu? --      Excl. in GC? --     Constitutional: Alert and oriented. No acute distress Eyes: Conjunctivae are normal. Normal extraocular movements. ENT   Head: Normocephalic and atraumatic.   Nose: No congestion/rhinnorhea.   Mouth/Throat: Mucous membranes are moist.   Neck: No stridor. Cardiovascular: Normal rate, regular rhythm. No murmurs, rubs, or gallops. Respiratory: Normal respiratory effort without tachypnea nor retractions. Breath sounds are clear and equal bilaterally. No wheezes/rales/rhonchi. Gastrointestinal: Soft and nontender. Normal bowel sounds Musculoskeletal: Nontender with normal range of motion in extremities. No lower extremity tenderness nor edema. Neurologic:  No gross focal neurologic deficits are appreciated.  Skin:  Skin is warm, dry and intact. No rash noted. Psychiatric: Flat affect. Patient appears to be responding to internal stimuli ____________________________________________  ED COURSE:  Pertinent labs & imaging results that were available during my care of the patient were reviewed by me and considered in my medical decision making (see chart for details). Patient presents for hallucinations, we will assess with labs and consult psychiatry as indicated   Procedures ____________________________________________   LABS (pertinent positives/negatives)  Labs  Reviewed  CBC  COMPREHENSIVE METABOLIC PANEL  ETHANOL  SALICYLATE LEVEL  ACETAMINOPHEN LEVEL  URINE DRUG SCREEN, QUALITATIVE ([facility] ONLY)   ____________________________________________  FINAL ASSESSMENT AND PLAN  Hallucinations, schizophrenia  Plan: Patient's labs were dictated above. Patient had presented for hallucinations and appears to be psychotic. We will keep him here at least overnight and have him talk to psychiatry.   Emily Filbert, MD   Note: This note was generated in part or whole with voice recognition software. Voice recognition is usually quite accurate but there are transcription errors that can and very often do occur. I apologize for any typographical errors that were not detected and corrected.     Emily Filbert, MD 03/23/17 2123

## 2017-03-24 ENCOUNTER — Inpatient Hospital Stay
Admission: AD | Admit: 2017-03-24 | Discharge: 2017-03-27 | DRG: 885 | Disposition: A | Discharge status: Home / Self Care | Payer: Medicare Other | Source: Intra-hospital | Attending: Psychiatry | Admitting: Psychiatry

## 2017-03-24 DIAGNOSIS — F410 Panic disorder [episodic paroxysmal anxiety] without agoraphobia: Secondary | ICD-10-CM | POA: Diagnosis present

## 2017-03-24 DIAGNOSIS — F1220 Cannabis dependence, uncomplicated: Secondary | ICD-10-CM | POA: Diagnosis present

## 2017-03-24 DIAGNOSIS — F17200 Nicotine dependence, unspecified, uncomplicated: Secondary | ICD-10-CM | POA: Diagnosis present

## 2017-03-24 DIAGNOSIS — Z91199 Patient's noncompliance with other medical treatment and regimen due to unspecified reason: Secondary | ICD-10-CM

## 2017-03-24 DIAGNOSIS — G4700 Insomnia, unspecified: Secondary | ICD-10-CM | POA: Diagnosis present

## 2017-03-24 DIAGNOSIS — F209 Schizophrenia, unspecified: Secondary | ICD-10-CM

## 2017-03-24 DIAGNOSIS — Z79899 Other long term (current) drug therapy: Secondary | ICD-10-CM

## 2017-03-24 DIAGNOSIS — F17210 Nicotine dependence, cigarettes, uncomplicated: Secondary | ICD-10-CM | POA: Diagnosis present

## 2017-03-24 DIAGNOSIS — Z9114 Patient's other noncompliance with medication regimen: Secondary | ICD-10-CM | POA: Diagnosis not present

## 2017-03-24 DIAGNOSIS — Z9119 Patient's noncompliance with other medical treatment and regimen: Secondary | ICD-10-CM

## 2017-03-24 DIAGNOSIS — F203 Undifferentiated schizophrenia: Secondary | ICD-10-CM | POA: Diagnosis not present

## 2017-03-24 LAB — URINE DRUG SCREEN, QUALITATIVE (ARMC ONLY)
Amphetamines, Ur Screen: NOT DETECTED
BARBITURATES, UR SCREEN: NOT DETECTED
BENZODIAZEPINE, UR SCRN: NOT DETECTED
CANNABINOID 50 NG, UR ~~LOC~~: POSITIVE — AB
Cocaine Metabolite,Ur ~~LOC~~: NOT DETECTED
MDMA (ECSTASY) UR SCREEN: NOT DETECTED
Methadone Scn, Ur: NOT DETECTED
Opiate, Ur Screen: NOT DETECTED
Phencyclidine (PCP) Ur S: NOT DETECTED
TRICYCLIC, UR SCREEN: NOT DETECTED

## 2017-03-24 MED ORDER — LORAZEPAM 2 MG PO TABS
2.0000 mg | ORAL_TABLET | Freq: Two times a day (BID) | ORAL | Status: DC
  Administered 2017-03-24 – 2017-03-25 (×2): 2 mg via ORAL
  Filled 2017-03-24 (×2): qty 1

## 2017-03-24 MED ORDER — HYDROXYZINE HCL 50 MG PO TABS: 50.0000 mg | ORAL_TABLET | Freq: Three times a day (TID) | ORAL | Status: DC | PRN

## 2017-03-24 MED ORDER — FLUVOXAMINE MALEATE 50 MG PO TABS
100.0000 mg | ORAL_TABLET | Freq: Every day | ORAL | Status: DC
  Administered 2017-03-24 – 2017-03-26 (×3): 100 mg via ORAL
  Filled 2017-03-24 (×3): qty 2

## 2017-03-24 MED ORDER — ARIPIPRAZOLE 10 MG PO TABS
20.0000 mg | ORAL_TABLET | Freq: Every day | ORAL | Status: DC
  Administered 2017-03-24 – 2017-03-27 (×4): 20 mg via ORAL
  Filled 2017-03-24 (×4): qty 2

## 2017-03-24 MED ORDER — MAGNESIUM HYDROXIDE 400 MG/5ML PO SUSP: 30.0000 mL | Freq: Every day | ORAL | Status: DC | PRN

## 2017-03-24 MED ORDER — ACETAMINOPHEN 325 MG PO TABS: 650.0000 mg | ORAL_TABLET | Freq: Four times a day (QID) | ORAL | Status: DC | PRN

## 2017-03-24 MED ORDER — TRAZODONE HCL 50 MG PO TABS
50.0000 mg | ORAL_TABLET | Freq: Every evening | ORAL | Status: DC | PRN
Start: 2017-03-24 — End: 2017-03-27

## 2017-03-24 MED ORDER — ALUM & MAG HYDROXIDE-SIMETH 200-200-20 MG/5ML PO SUSP: 30.0000 mL | ORAL | Status: DC | PRN

## 2017-03-24 NOTE — ED Notes (Signed)
Pt denies SI, HI, AH, and pain. He does endorse seeing some shadows. He ate breakfast and provided a dark amber-colored urine specimen. Pt was given a Sprite and encouraged to drink fluids today. Pt denies needs. Encouraged pt to share concerns, questions, and needs with staff. Pt remains safe with camera monitoring and safety checks in place.

## 2017-03-24 NOTE — ED Notes (Signed)
BEHAVIORAL HEALTH ROUNDING Patient sleeping: Yes.   Patient alert and oriented: not applicable SLEEPING Behavior appropriate: Yes.  ; If no, describe: SLEEPING Nutrition and fluids offered: No SLEEPING Toileting and hygiene offered: NoSLEEPING Sitter present: not applicable, Q 15 min safety rounds and observation via security camera. Law enforcement present: Yes ODS 

## 2017-03-24 NOTE — Plan of Care (Signed)
Problem: Education: Goal: Knowledge of [facility] Education information/materials will improve Outcome: Progressing Patient verbalized understand with explanation of unit programing

## 2017-03-24 NOTE — Progress Notes (Signed)
Admission  D: 31 year old white male  With schizophrenia. Voice of auditory hallucinations and visual . Patient stated he see's shadows .  Thought blocking and poverty of thoughts  Patient smokes pot  Denies ETOH. Stated he just needed to come here to clear his mind   A: Searched  With no contraband found . Chest not to have small  Redden rash in center of chest. .  Pt appeared depressed  With  a flat affect. Pt is redirectable and cooperative with assessmentt   Pt  educated on therapeutic milieu rules. Pt was introduced to milieu by nursing staff.    R: Pt was receptive to education about the milieu .  15 min safety checks started. Clinical research associatewriter offered support

## 2017-03-24 NOTE — [facility] Group Notes (Signed)
[facility] Group Notes:  (Nursing/MHT/Case Management/Adjunct)  Date:  03/24/2017  Time:  11:25 PM  Type of Therapy:  Group Therapy  Participation Level:  Did Not Attend  Participation QualitSummary of Progress/Problems:  Mayra NeerJackie L Evaan Tidwell 03/24/2017, 11:25 PM

## 2017-03-24 NOTE — BH Assessment (Signed)
[facility] Joseph ArtSubedi.  Attending Physician will be Dr. Jennet MaduroPucilowska.   [facility]   Intake Paper Work has been signed and placed on patient chart.  ER staff is aware of the admission Christen Bame(Ronnie, ER Sect.; Dr. Fanny BienQuale, ER MD; Lanora ManisElizabeth, Patient's Nurse & Marylene LandAngela, Patient Access).

## 2017-03-24 NOTE — Tx Team (Signed)
Initial Treatment Plan 03/24/2017 6:58 PM Edwin Montes [id number]    PATIENT STRESSORS: Health problems Medication change or noncompliance Substance abuse   PATIENT STRENGTHS: Ability for insight Communication skills Supportive family/friends   PATIENT IDENTIFIED PROBLEMS: Altered Thought Process 03/24/17  Smokes Mariajuana 03/24/17                   DISCHARGE CRITERIA:  Ability to meet basic life and health needs Improved stabilization in mood, thinking, and/or behavior  PRELIMINARY DISCHARGE PLAN: Outpatient therapy Return to previous living arrangement  PATIENT/FAMILY INVOLVEMENT: This treatment plan has been presented to and reviewed with the patient, Edwin Montes, and/or family member,  .  The patient and family have been given the opportunity to ask questions and make suggestions.  [name], RN 03/24/2017, 6:58 PM

## 2017-03-24 NOTE — Consult Note (Signed)
[facility] Consult   Reason for Consult:  psychosis Referring Physician:  Lenise Herald Patient Identification: Edwin Montes MRN:  [id number] Principal Diagnosis: <principal problem not specified> Diagnosis:   Patient Active Problem List   Diagnosis Date Noted  . Adjustment disorder with anxiety [F43.22] 02/15/2017  . Schizophrenia, undifferentiated ([place]) [F20.3] 11/03/2016  . Cannabis use disorder, moderate, dependence ([place]) [F12.20] 11/03/2016  . Panic attack [F41.0] 06/23/2016  . Noncompliance [Z91.19] 02/29/2016  . Tobacco use disorder [F17.200] 01/27/2016    Total Time spent with patient: 1 hour  Subjective:   Edwin Montes is a 31 y.o. male patient admitted with " I see shadows".  HPI:   Edwin Montes is a 31 y.o. male with h/o schizophrenia who presents to the ED for  anxiety, and hallucinations of seeing shadows in the room for the last few days. Pt was at ED  about 1 month ago for anxiety, was not admitted.  Pt responds very slowly with soft voice, with thought blocking and poverty of thought. Pt admits VH of shadows and also AH. Admits not taking meds as supposed to be taken. Pt appears to be responding to internal stimuli, has poor eye contact. Denies SI/HI. Admits using THC occasionally only.  Past Psychiatric History:  History of schizophrenia. Positive previous hospitalizations. No previous suicide attempts or violence. Multiple ED visits  under identical circumstances, last 1 month ago.  Has ACT team.  Meds- Abilify 20 mg po , Abilify shot 463m q month,  ? Received on 03/15/2017), luvox 1042mdaily.  Risk to Self: Suicidal Ideation: No Suicidal Intent: No Is patient at risk for suicide?: No Suicidal Plan?: No Access to Means: No What has been your use of drugs/alcohol within the last 12 months?: Denied use How many times?: 0 Other Self Harm Risks: denied Triggers for Past Attempts: None known Intentional Self Injurious Behavior: None Risk to Others:  Homicidal Ideation: No Thoughts of Harm to Others: No Current Homicidal Intent: No Current Homicidal Plan: No Access to Homicidal Means: No Identified Victim: None identified History of harm to others?: No Assessment of Violence: None Noted Does patient have access to weapons?: No Criminal Charges Pending?: No Does patient have a court date: No Prior Inpatient Therapy: Prior Inpatient Therapy: Yes Prior Therapy Dates: 2008 Prior Therapy Facilty/Provider(s): [facility],  Reason for Treatment: Unsure Prior Outpatient Therapy: Prior Outpatient Therapy: Yes Prior Therapy Dates: Current Prior Therapy Facilty/Provider(s): [facility] -ACT team Reason for Treatment: Schizophrenia Does patient have an ACCT team?: Yes Does patient have Intensive In-House Services?  : No Does patient have [facility] services? : No Does patient have P4CC services?: No  Past Medical History:  Past Medical History:  Diagnosis Date  . Anxiety   . [facility]    Past Surgical History:  Procedure Laterality Date  . NO PAST SURGERIES     Family History: History reviewed. No pertinent family history. Family Psychiatric  History: unknown Social History:  History  Alcohol Use No     History  Drug Use No    Social History   Social History  . Marital status: Single    Spouse name: N/A  . Number of children: N/A  . Years of education: N/A   Social History Main Topics  . Smoking status: Current Some Day Smoker    Packs/day: 0.50    Types: Cigarettes  . Smokeless tobacco: Never Used  . Alcohol use No  . Drug use: No  . Sexual activity: No   Other  Topics Concern  . None   Social History Narrative  . None   Additional Social History:    Allergies:  No Known Allergies  Labs:  Results for orders placed or performed during the hospital encounter of 03/23/17 (from the past 48 hour(s))  Comprehensive metabolic panel     Status: None   Collection Time: 03/23/17  8:48 PM  Result Value Ref  Range   Sodium 139 135 - 145 mmol/L   Potassium 4.1 3.5 - 5.1 mmol/L   Chloride 102 101 - 111 mmol/L   CO2 28 22 - 32 mmol/L   Glucose, Bld 78 65 - 99 mg/dL   BUN 18 6 - 20 mg/dL   Creatinine, Ser 1.09 0.61 - 1.24 mg/dL   Calcium 9.2 8.9 - 10.3 mg/dL   Total Protein 7.6 6.5 - 8.1 g/dL   Albumin 4.8 3.5 - 5.0 g/dL   AST 29 15 - 41 U/L   ALT 18 17 - 63 U/L   Alkaline Phosphatase 63 38 - 126 U/L   Total Bilirubin 1.0 0.3 - 1.2 mg/dL   GFR calc non Af Amer >60 >60 mL/min   GFR calc Af Amer >60 >60 mL/min    Comment: (NOTE) The eGFR has been calculated using the CKD EPI equation. This calculation has not been validated in all clinical situations. eGFR's persistently <60 mL/min signify possible Chronic Kidney Disease.    Anion gap 9 5 - 15  Ethanol     Status: None   Collection Time: 03/23/17  8:48 PM  Result Value Ref Range   Alcohol, Ethyl (B) <5 <5 mg/dL    Comment:        LOWEST DETECTABLE LIMIT FOR SERUM ALCOHOL IS 5 mg/dL FOR MEDICAL PURPOSES ONLY   Salicylate level     Status: None   Collection Time: 03/23/17  8:48 PM  Result Value Ref Range   Salicylate Lvl <2.4 2.8 - 30.0 mg/dL  Acetaminophen level     Status: Abnormal   Collection Time: 03/23/17  8:48 PM  Result Value Ref Range   Acetaminophen (Tylenol), Serum <10 (L) 10 - 30 ug/mL    Comment:        THERAPEUTIC CONCENTRATIONS VARY SIGNIFICANTLY. A RANGE OF 10-30 ug/mL MAY BE AN EFFECTIVE CONCENTRATION FOR MANY PATIENTS. HOWEVER, SOME ARE BEST TREATED AT CONCENTRATIONS OUTSIDE THIS RANGE. ACETAMINOPHEN CONCENTRATIONS >150 ug/mL AT 4 HOURS AFTER INGESTION AND >50 ug/mL AT 12 HOURS AFTER INGESTION ARE OFTEN ASSOCIATED WITH TOXIC REACTIONS.   cbc     Status: None   Collection Time: 03/23/17  8:48 PM  Result Value Ref Range   WBC 8.6 3.8 - 10.6 K/uL   RBC 5.28 4.40 - 5.90 MIL/uL   Hemoglobin 16.0 13.0 - 18.0 g/dL   HCT 46.6 40.0 - 52.0 %   MCV 88.2 80.0 - 100.0 fL   MCH 30.3 26.0 - 34.0 pg   MCHC 34.3  32.0 - 36.0 g/dL   RDW 13.0 11.5 - 14.5 %   Platelets 294 150 - 440 K/uL  Urine Drug Screen, Qualitative     Status: Abnormal   Collection Time: 03/24/17  8:12 AM  Result Value Ref Range   Tricyclic, Ur Screen NONE DETECTED NONE DETECTED   Amphetamines, Ur Screen NONE DETECTED NONE DETECTED   MDMA (Ecstasy)Ur Screen NONE DETECTED NONE DETECTED   Cocaine Metabolite,Ur [place] NONE DETECTED NONE DETECTED   Opiate, Ur Screen NONE DETECTED NONE DETECTED   Phencyclidine (PCP) Ur S NONE DETECTED  NONE DETECTED   Cannabinoid 50 Ng, Ur [place] POSITIVE (A) NONE DETECTED   Barbiturates, Ur Screen NONE DETECTED NONE DETECTED   Benzodiazepine, Ur Scrn NONE DETECTED NONE DETECTED   Methadone Scn, Ur NONE DETECTED NONE DETECTED    Comment: (NOTE) 224  Tricyclics, urine               Cutoff 1000 ng/mL 200  Amphetamines, urine             Cutoff 1000 ng/mL 300  MDMA (Ecstasy), urine           Cutoff 500 ng/mL 400  Cocaine Metabolite, urine       Cutoff 300 ng/mL 500  Opiate, urine                   Cutoff 300 ng/mL 600  Phencyclidine (PCP), urine      Cutoff 25 ng/mL 700  Cannabinoid, urine              Cutoff 50 ng/mL 800  Barbiturates, urine             Cutoff 200 ng/mL 900  Benzodiazepine, urine           Cutoff 200 ng/mL 1000 Methadone, urine                Cutoff 300 ng/mL 1100 1200 The urine drug screen provides only a preliminary, unconfirmed 1300 analytical test result and should not be used for non-medical 1400 purposes. Clinical consideration and professional judgment should 1500 be applied to any positive drug screen result due to possible 1600 interfering substances. A more specific alternate chemical method 1700 must be used in order to obtain a confirmed analytical result.  1800 Gas chromato graphy / mass spectrometry (GC/MS) is the preferred 1900 confirmatory method.     No current facility-administered medications for this encounter.    Current Outpatient Prescriptions  Medication  Sig Dispense Refill  . ARIPiprazole (ABILIFY) 20 MG tablet Take 1 tablet (20 mg total) by mouth daily. 30 tablet 0  . ARIPiprazole ER 400 MG SUSR Inject 400 mg into the muscle every 28 (twenty-eight) days. Due on January 19 1 each 1  . fluvoxaMINE (LUVOX) 100 MG tablet Take 1 tablet (100 mg total) by mouth at bedtime. 30 tablet 1  . LORazepam (ATIVAN) 2 MG tablet Take 1 tablet (2 mg total) by mouth at bedtime as needed for anxiety. 30 tablet 0    Musculoskeletal: Strength & Muscle Tone: within normal limits Gait & Station: normal Patient leans: N/A  Psychiatric Specialty Exam: Physical Exam  Nursing note and vitals reviewed. Constitutional: He appears well-developed and well-nourished.  HENT:  Head: Normocephalic and atraumatic.  Respiratory: Effort normal.    ROS  Blood pressure 118/74, pulse 91, temperature 98 F (36.7 C), temperature source Oral, resp. rate 18, height _0  (1.88 m), weight 97.1 kg (214 lb), SpO2 98 %.Body mass index is 27.48 kg/m.  General Appearance: Disheveled  Eye Contact:  Poor  Speech:  Slow  Volume:  Decreased  Mood:  Anxious  Affect:  Blunt  Thought Process: block at times   Orientation:  Full (Time, Place, and Person)  Thought Content:  VH of shadows, AH of noise  Suicidal Thoughts:  No  Homicidal Thoughts:  No  Memory:  Grossly intact  Judgement:  Impaired  Insight:  Lacking  Psychomotor Activity:  Decreased  Concentration:  poor  Recall:  [place] of Knowledge:  Good  Language:  Good  Akathisia:  No  Handed:    AIMS (if indicated):     Assets:  Social Support  ADL's:  Impaired  Cognition:  Poor concentration  Sleep:        Treatment Plan Summary: Pt is actively psychotic and anxious and has catatonia like symptoms, unable to take care self.  Daily contact with patient to assess and evaluate symptoms and progress in treatment and Medication management Recommendation- continue current psych meds.  Ativan 2 mg tid for anxiety and  catatonia like symptoms .  Disposition: Recommend psychiatric Inpatient admission when medically cleared.  Lenward Chancellor, MD 03/24/2017 11:32 AM

## 2017-03-24 NOTE — ED Notes (Signed)
Report called to Gwen RN.

## 2017-03-25 ENCOUNTER — Encounter: Payer: Self-pay | Admitting: Psychiatry

## 2017-03-25 DIAGNOSIS — F209 Schizophrenia, unspecified: Secondary | ICD-10-CM

## 2017-03-25 MED ORDER — CLONAZEPAM 0.5 MG PO TABS
0.5000 mg | ORAL_TABLET | Freq: Two times a day (BID) | ORAL | Status: DC
  Administered 2017-03-25 – 2017-03-26 (×2): 0.5 mg via ORAL
  Filled 2017-03-25 (×2): qty 1

## 2017-03-25 NOTE — H&P (Signed)
Psychiatric Admission Assessment Adult  Patient Identification: Edwin Montes MRN:  [id number] Date of Evaluation:  03/25/2017 Chief Complaint:  Schizophrenia Principal Diagnosis: Schizophrenia ([place]) Diagnosis:   Patient Active Problem List   Diagnosis Date Noted  . Schizophrenia ([place]) [F20.9] 03/24/2017  . Cannabis use disorder, moderate, dependence ([place]) [F12.20] 11/03/2016  . Panic attack [F41.0] 06/23/2016  . Noncompliance [Z91.19] 02/29/2016  . Tobacco use disorder [F17.200] 01/27/2016   History of Present Illness:   Patient is a 31 year old single Caucasian male who carries a diagnosis of schizophrenia. The patient presented via ambulance to our emergency department on May 11. The patient reported that he was noncompliant with his oral dose of Abilify had been is keeping some doses.  Patient reported that he was having hallucinations, mainly seeing shadows and therefore he felt he needed to come into the hospital. Per the ER notes patient appeared to be interacting to internal stimuli.  Patient tells me he had a panic attack as well. He says that he has episodes of shortness of breath and chest pain once in a while without having any clear triggers.  He is a limited historian. Says that he has been seeing a doctor at [facility] and has been going to country club day program which he enjoys.  In addition to taking oral Abilify the patient is also receiving Abilify injectable 400 mg every month. Says that he received his last injection at the beginning of May.  Today he denies having suicidality, homicidality or side effects from medications. Says that he continues to see shadows but not as bad as yesterday. He denies having any auditory hallucinations.  He denies the abuse of any substances or alcohol. Urine toxicology was positive for analyst. Also per chart looks like he has a history of cannabis abuse. His alcohol level at arrival was below detection limit.   Associated  Signs/Symptoms: Depression Symptoms:  denies (Hypo) Manic Symptoms:  denies Anxiety Symptoms:  Excessive Worry, Panic Symptoms, Psychotic Symptoms:  Hallucinations: Visual PTSD Symptoms: NA Total Time spent with patient: 1 hour  Past Psychiatric History: Diagnosed with schizophrenia in his early 79s. Currently treated with Abilify injectable every month and oral Abilify. He is also on Luvox 100 mg a day. Patient follows up with [facility] and goes to a day program  Is the patient at risk to self? Yes.    Has the patient been a risk to self in the past 6 months? No.  Has the patient been a risk to self within the distant past? No.  Is the patient a risk to others? No.  Has the patient been a risk to others in the past 6 months? No.  Has the patient been a risk to others within the distant past? No.      Alcohol Screening: 1. How often do you have a drink containing alcohol?: Never 2. How many drinks containing alcohol do you have on a typical day when you are drinking?: 1 or 2 3. How often do you have six or more drinks on one occasion?: Never Preliminary Score: 0 4. How often during the last year have you found that you were not able to stop drinking once you had started?: Never 5. How often during the last year have you failed to do what was normally expected from you becasue of drinking?: Never 6. How often during the last year have you needed a first drink in the morning to get yourself going after a heavy drinking session?: Never 7. How  often during the last year have you had a feeling of guilt of remorse after drinking?: Never 8. How often during the last year have you been unable to remember what happened the night before because you had been drinking?: Never 9. Have you or someone else been injured as a result of your drinking?: No 10. Has a relative or friend or a doctor or another health worker been concerned about your drinking or suggested you cut down?: No Alcohol Use Disorder  Identification Test Final Score (AUDIT): 0 Brief Intervention: AUDIT score less than 7 or less-screening does not suggest unhealthy drinking-brief intervention not indicated  Past Medical History:  Past Medical History:  Diagnosis Date  . Anxiety   . Schizophrenia [facility])     Past Surgical History:  Procedure Laterality Date  . NO PAST SURGERIES     Family History: History reviewed. No pertinent family history.   Family Psychiatric  History: Denies any family history of mental illness, substance abuse or suicide  Tobacco Screening: Have you used any form of tobacco in the last 30 days? (Cigarettes, Smokeless Tobacco, Cigars, and/or Pipes): Yes Tobacco use, Select all that apply: 4 or less cigarettes per day Are you interested in Tobacco Cessation Medications?: Yes, will notify MD for an order Counseled patient on smoking cessation including recognizing danger situations, developing coping skills and basic information about quitting provided: Refused/Declined practical counseling   Social History: Patient lives alone, is single, never married doesn't have any children. He receives disability for mental illness  He graduated from high school. He denies any history of abuse. He has been a resident of multiple group homes. History  Alcohol Use No     History  Drug Use No     Allergies:  No Known Allergies   Lab Results:  Results for orders placed or performed during the hospital encounter of 03/23/17 (from the past 48 hour(s))  Comprehensive metabolic panel     Status: None   Collection Time: 03/23/17  8:48 PM  Result Value Ref Range   Sodium 139 135 - 145 mmol/L   Potassium 4.1 3.5 - 5.1 mmol/L   Chloride 102 101 - 111 mmol/L   CO2 28 22 - 32 mmol/L   Glucose, Bld 78 65 - 99 mg/dL   BUN 18 6 - 20 mg/dL   Creatinine, Ser 1.09 0.61 - 1.24 mg/dL   Calcium 9.2 8.9 - 10.3 mg/dL   Total Protein 7.6 6.5 - 8.1 g/dL   Albumin 4.8 3.5 - 5.0 g/dL   AST 29 15 - 41 U/L   ALT 18 17 -  63 U/L   Alkaline Phosphatase 63 38 - 126 U/L   Total Bilirubin 1.0 0.3 - 1.2 mg/dL   GFR calc non Af Amer >60 >60 mL/min   GFR calc Af Amer >60 >60 mL/min    Comment: (NOTE) The eGFR has been calculated using the CKD EPI equation. This calculation has not been validated in all clinical situations. eGFR's persistently <60 mL/min signify possible Chronic Kidney Disease.    Anion gap 9 5 - 15  Ethanol     Status: None   Collection Time: 03/23/17  8:48 PM  Result Value Ref Range   Alcohol, Ethyl (B) <5 <5 mg/dL    Comment:        LOWEST DETECTABLE LIMIT FOR SERUM ALCOHOL IS 5 mg/dL FOR MEDICAL PURPOSES ONLY   Salicylate level     Status: None   Collection Time: 03/23/17  8:48  PM  Result Value Ref Range   Salicylate Lvl <9.4 2.8 - 30.0 mg/dL  Acetaminophen level     Status: Abnormal   Collection Time: 03/23/17  8:48 PM  Result Value Ref Range   Acetaminophen (Tylenol), Serum <10 (L) 10 - 30 ug/mL    Comment:        THERAPEUTIC CONCENTRATIONS VARY SIGNIFICANTLY. A RANGE OF 10-30 ug/mL MAY BE AN EFFECTIVE CONCENTRATION FOR MANY PATIENTS. HOWEVER, SOME ARE BEST TREATED AT CONCENTRATIONS OUTSIDE THIS RANGE. ACETAMINOPHEN CONCENTRATIONS >150 ug/mL AT 4 HOURS AFTER INGESTION AND >50 ug/mL AT 12 HOURS AFTER INGESTION ARE OFTEN ASSOCIATED WITH TOXIC REACTIONS.   cbc     Status: None   Collection Time: 03/23/17  8:48 PM  Result Value Ref Range   WBC 8.6 3.8 - 10.6 K/uL   RBC 5.28 4.40 - 5.90 MIL/uL   Hemoglobin 16.0 13.0 - 18.0 g/dL   HCT 46.6 40.0 - 52.0 %   MCV 88.2 80.0 - 100.0 fL   MCH 30.3 26.0 - 34.0 pg   MCHC 34.3 32.0 - 36.0 g/dL   RDW 13.0 11.5 - 14.5 %   Platelets 294 150 - 440 K/uL  Urine Drug Screen, Qualitative     Status: Abnormal   Collection Time: 03/24/17  8:12 AM  Result Value Ref Range   Tricyclic, Ur Screen NONE DETECTED NONE DETECTED   Amphetamines, Ur Screen NONE DETECTED NONE DETECTED   MDMA (Ecstasy)Ur Screen NONE DETECTED NONE DETECTED    Cocaine Metabolite,Ur [place] NONE DETECTED NONE DETECTED   Opiate, Ur Screen NONE DETECTED NONE DETECTED   Phencyclidine (PCP) Ur S NONE DETECTED NONE DETECTED   Cannabinoid 50 Ng, Ur [place] POSITIVE (A) NONE DETECTED   Barbiturates, Ur Screen NONE DETECTED NONE DETECTED   Benzodiazepine, Ur Scrn NONE DETECTED NONE DETECTED   Methadone Scn, Ur NONE DETECTED NONE DETECTED    Comment: (NOTE) 801  Tricyclics, urine               Cutoff 1000 ng/mL 200  Amphetamines, urine             Cutoff 1000 ng/mL 300  MDMA (Ecstasy), urine           Cutoff 500 ng/mL 400  Cocaine Metabolite, urine       Cutoff 300 ng/mL 500  Opiate, urine                   Cutoff 300 ng/mL 600  Phencyclidine (PCP), urine      Cutoff 25 ng/mL 700  Cannabinoid, urine              Cutoff 50 ng/mL 800  Barbiturates, urine             Cutoff 200 ng/mL 900  Benzodiazepine, urine           Cutoff 200 ng/mL 1000 Methadone, urine                Cutoff 300 ng/mL 1100 1200 The urine drug screen provides only a preliminary, unconfirmed 1300 analytical test result and should not be used for non-medical 1400 purposes. Clinical consideration and professional judgment should 1500 be applied to any positive drug screen result due to possible 1600 interfering substances. A more specific alternate chemical method 1700 must be used in order to obtain a confirmed analytical result.  1800 Gas chromato graphy / mass spectrometry (GC/MS) is the preferred 1900 confirmatory method.     Blood Alcohol level:  Lab  Results  Component Value Date   ETH <5 03/23/2017   ETH <5 67/11/4101    Metabolic Disorder Labs:  Lab Results  Component Value Date   HGBA1C 5.2 11/03/2016   MPG 103 11/03/2016   Lab Results  Component Value Date   PROLACTIN 1.7 (L) 01/27/2016   Lab Results  Component Value Date   CHOL 214 (H) 11/03/2016   TRIG 76 11/03/2016   HDL 58 11/03/2016   CHOLHDL 3.7 11/03/2016   VLDL 15 11/03/2016   LDLCALC 141 (H) 11/03/2016    LDLCALC 100 (H) 01/27/2016    Current Medications: Current Facility-Administered Medications  Medication Dose Route Frequency Provider Last Rate Last Dose  . acetaminophen (TYLENOL) tablet 650 mg  650 mg Oral Q6H PRN Lenward Chancellor, MD      . alum & mag hydroxide-simeth (MAALOX/MYLANTA) 200-200-20 MG/5ML suspension 30 mL  30 mL Oral Q4H PRN Lenward Chancellor, MD      . ARIPiprazole (ABILIFY) tablet 20 mg  20 mg Oral Daily Lenward Chancellor, MD   20 mg at 03/25/17 0131  . clonazePAM (KLONOPIN) tablet 0.5 mg  0.5 mg Oral BID Hildred Priest, MD      . fluvoxaMINE (LUVOX) tablet 100 mg  100 mg Oral QHS Lenward Chancellor, MD   100 mg at 03/24/17 2024  . magnesium hydroxide (MILK OF MAGNESIA) suspension 30 mL  30 mL Oral Daily PRN Lenward Chancellor, MD      . traZODone (DESYREL) tablet 50 mg  50 mg Oral QHS PRN Lenward Chancellor, MD       PTA Medications: Prescriptions Prior to Admission  Medication Sig Dispense Refill Last Dose  . ARIPiprazole (ABILIFY) 20 MG tablet Take 1 tablet (20 mg total) by mouth daily. 30 tablet 0   . ARIPiprazole ER 400 MG SUSR Inject 400 mg into the muscle every 28 (twenty-eight) days. Due on January 19 1 each 1   . fluvoxaMINE (LUVOX) 100 MG tablet Take 1 tablet (100 mg total) by mouth at bedtime. 30 tablet 1   . LORazepam (ATIVAN) 2 MG tablet Take 1 tablet (2 mg total) by mouth at bedtime as needed for anxiety. 30 tablet 0     Musculoskeletal: Strength & Muscle Tone: within normal limits Gait & Station: normal Patient leans: N/A  Psychiatric Specialty Exam: Physical Exam  Constitutional: He is oriented to person, place, and time. He appears well-developed and well-nourished.  HENT:  Head: Normocephalic and atraumatic.  Neck: Normal range of motion.  Respiratory: Effort normal.  Musculoskeletal: Normal range of motion.  Neurological: He is alert and oriented to person, place, and time.    Review of Systems  Constitutional: Negative.   HENT:  Negative.   Eyes: Negative.   Respiratory: Negative.   Cardiovascular: Negative.   Gastrointestinal: Negative.   Genitourinary: Negative.   Musculoskeletal: Negative.   Skin: Negative.   Neurological: Negative.   Endo/Heme/Allergies: Negative.   Psychiatric/Behavioral: Positive for hallucinations. Negative for depression, memory loss, substance abuse and suicidal ideas. The patient is nervous/anxious. The patient does not have insomnia.     Blood pressure 110/60, pulse 75, temperature 97.9 F (36.6 C), temperature source Oral, resp. rate 18, height '6\' 2"'  (1.88 m), weight 94.8 kg (209 lb), SpO2 97 %.Body mass index is 26.83 kg/m.  General Appearance: Well Groomed  Eye Contact:  Minimal  Speech:  Clear and Coherent  Volume:  Normal  Mood:  Euthymic  Affect:  Constricted  Thought Process:  Linear and Descriptions of Associations: Intact  Orientation:  Full (Time, Place, and Person)  Thought Content:  Hallucinations: Visual  Suicidal Thoughts:  No  Homicidal Thoughts:  No  Memory:  Immediate;   Fair Recent;   Fair Remote;   Fair  Judgement:  Fair  Insight:  Fair  Psychomotor Activity:  Normal  Concentration:  Concentration: Fair and Attention Span: Fair  Recall:  [employer] of Knowledge:  Fair  Language:  Good  Akathisia:  No  Handed:    AIMS (if indicated):     Assets:  Communication Skills Physical Health  ADL's:  Intact  Cognition:  WNL  Sleep:  Number of Hours: 8.15    Treatment Plan Summary:  31 year old single Caucasian male with schizophrenia. History of noncompliance with presents due to having anxiety and hallucinations due to not being compliant with oral Abilify.  Schizophrenia continue Abilify 20 mg a day along with Abilify injectable 400 mg every 30 days. He received his last injection at the beginning of this month.  Anxiety and possibly panic disorder. Patient is on Luvox 100 mg by mouth daily at bedtime. I will restart him on clonazepam 0.5 mg by mouth  twice a day for now  Insomnia patient will be continued and trazodone 50 mg by mouth daily at bedtime when necessary  Cannabis use disorder patient has received education about the negative effects of cannabis in schizophrenia  Tobacco use disorder I will order nicotine patch 14 mg a day  Labs I will order hemoglobin A1c, lipid panel and TSH  Precautions every 15 minute checks  Disposition once a stable he will be discharged back to his home  Follow up continue to follow-up with [facility] and continue to attend day program   Physician Treatment Plan for Primary Diagnosis: Schizophrenia ([place]) Long Term Goal(s): Improvement in symptoms so as ready for discharge  Short Term Goals: Ability to identify changes in lifestyle to reduce recurrence of condition will improve and Compliance with prescribed medications will improve  Physician Treatment Plan for Secondary Diagnosis: Principal Problem:   Schizophrenia ([place]) Active Problems:   Tobacco use disorder   Noncompliance   Panic attack   Cannabis use disorder, moderate, dependence ([place])  Long Term Goal(s): Improvement in symptoms so as ready for discharge  Short Term Goals: Ability to identify and develop effective coping behaviors will improve  I certify that inpatient services furnished can reasonably be expected to improve the patient's condition.    Hildred Priest, MD 5/13/201811:46 AM

## 2017-03-25 NOTE — Plan of Care (Signed)
Problem: Coping: Goal: Ability to verbalize feelings will improve Outcome: Not Progressing Guarded, forwards very little.  Answers yes and no questions.

## 2017-03-25 NOTE — Progress Notes (Signed)
Pt denies SI/HI/AVH. Medication compliant. Denies pain. Guarded. Isolates to room. Did not attend evening group. Voices no additional concerns at this time. Safety maintained. Will continue to monitor

## 2017-03-25 NOTE — [facility] Group Notes (Signed)
[facility] LCSW Group Therapy   03/25/2017 1 PM   Type of Therapy: Group Therapy   Participation Level: Minimal    Participation Quality: Attentive, Sharing and Supportive   Affect: Depressed and Flat   Cognitive: Alert and Oriented   Insight: Developing/Improving and Engaged   Engagement in Therapy: Developing/Improving and Engaged   Modes of Intervention: Clarification, Confrontation, Discussion, Education, Exploration, Limit-setting, Orientation, Problem-solving, Rapport Building, Dance movement psychotherapisteality Testing, Socialization and Support   Summary of Progress/Problems: The topic for group today was support or lack of support and how this affects recovery. This group focused on both positive and negative aspects of support or lack therof and allowed  group members to process ways to cope with negative emotions by discussing their coping mechanisms for a lack of support. Group members were asked to reflect on a time when their reaction to a lack of support led to a negative outcome and explored how using coping mechanisms to regulate their emotions had benefited them. Group members were also asked to discuss a time when emotion regulation was utilized when they felt overwhelmed.      Hampton AbbotKadijah Chania Kochanski, MSW, LCSW-A 03/25/2017, 3:14 PM

## 2017-03-25 NOTE — Progress Notes (Signed)
Pt denies SI/HI/AVH. Some inappropriate grinning and laughing noted. Medication compliant. Denies pain. Guarded. Isolates to room. Forwards little at this time. Pt noted to be blinking rapidly. Staring. Did not attend evening group. Voices no additional concerns at this time. Safety maintained. Will continue to monitor.

## 2017-03-25 NOTE — Progress Notes (Signed)
Denies SI/HI/AVH.  Affect flat and blunted.  No interaction with peers.  Isolates to self.  Paces hall at times.  Answers yes and no questions, no elaboration.  Medication and group compliant.  Support and encouragement offered. Safety maintained.

## 2017-03-25 NOTE — [facility] Suicide Risk Assessment (Signed)
[facility] INPATIENT:  Family/Significant Other Suicide Prevention Education  Suicide Prevention Education:  Education Completed; mother, Andria Rheineresa Carraway ph#: [phone] has been identified by the patient as the family member/significant other with whom the patient will be residing, and identified as the person(s) who will aid the patient in the event of a mental health crisis (suicidal ideations/suicide attempt).  With written consent from the patient, the family member/significant other has been provided the following suicide prevention education, prior to the and/or following the discharge of the patient.  The suicide prevention education provided includes the following:  Suicide risk factors  Suicide prevention and interventions  National Suicide Hotline telephone number  [facility] assessment telephone number  [facility] Assistance 911  [facility] and/or [facility]idential [facility] Crisis [facility] telephone number  Request made of family/significant other to:  Remove weapons (e.g., guns, rifles, knives), all items previously/currently identified as safety concern.    Remove drugs/medications (over-the-counter, prescriptions, illicit drugs), all items previously/currently identified as a safety concern.  The family member/significant other verbalizes understanding of the suicide prevention education information provided.  The family member/significant other agrees to remove the items of safety concern listed above.  Lynden OxfordKadijah R Terrea Montes, MSW, LCSW-A 03/25/2017, 9:47 AM

## 2017-03-25 NOTE — [facility] Suicide Risk Assessment (Signed)
[facility]   Nursing information obtained from:  Patient Demographic factors:  Caucasian, Living alone, Unemployed Current Mental Status:  NA Loss Factors:  Decrease in vocational status Historical Factors:  NA Risk Reduction Factors:  NA  Total Time spent with patient:  Principal Problem: Schizophrenia ([facility]) Diagnosis:   Patient Active Problem List   Diagnosis Date Noted  . Schizophrenia ([facility]) [F20.9] 03/24/2017  . 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 Panic attack [F41.0] 06/23/2016  . Noncompliance [Z91.19] 02/29/2016  . Tobacco use disorder [F17.200] 01/27/2016   Subjective Data:   Continued Clinical Symptoms:  Alcohol Use Disorder Identification Test Final Score (AUDIT): 0 The "Alcohol Use Disorders Identification Test", Guidelines for Use in Primary Care, Second Edition.  World Science writerHealth Organization [facility](WHO). Score between 0-7:  no or low risk or alcohol related problems. Score between 8-15:  moderate risk of alcohol related problems. Score between 16-19:  high risk of alcohol related problems. Score 20 or above:  warrants further diagnostic evaluation for alcohol dependence and treatment.   CLINICAL FACTORS:   Severe Anxiety and/or Agitation Schizophrenia:   Less than 29 years old Previous Psychiatric Diagnoses and Treatments   Musculoskeletal:   Psychiatric Specialty Exam: Physical Exam  ROS  Blood pressure 110/60, pulse 75, temperature 97.9 F (36.6 C), temperature source Oral, resp. rate 18, height 6\' 2"  (1.88 m), weight 94.8 kg (209 lb), SpO2 97 %.Body mass index is 26.83 kg/m.                                                    Sleep:  Number of Hours: 8.15      COGNITIVE FEATURES THAT CONTRIBUTE TO RISK:  Loss of executive function    SUICIDE RISK:   Moderate:  Frequent suicidal ideation with limited intensity, and duration, some specificity in terms of plans, no  associated intent, good self-control, limited dysphoria/symptomatology, some risk factors present, and identifiable protective factors, including available and accessible social support.  PLAN OF CARE: admit  I certify that inpatient services furnished can reasonably be expected to improve the patient's condition.   Jimmy FootmanHernandez-Gonzalez,  Quintell Bonnin, MD 03/25/2017, 11:45 AM

## 2017-03-25 NOTE — BHH Counselor (Signed)
Adult Comprehensive Assessment  Patient ID: Edwin Montes, male   DOB: [birth year], 31 y.o.   MRN: [id number]  Information Source: Information source: Patient  Current Stressors:  Educational / Learning stressors: n/a Employment / Job issues: Pt is unemployed but has disability Family Relationships: n/a Surveyor, quantityinancial / Lack of resources (include bankruptcy): n/a Housing / Lack of housing: n/a Physical health (include injuries & life threatening diseases): n/a Social relationships: n/a Substance abuse: n/a Bereavement / Loss: n/a  Living/Environment/Situation:  Living Arrangements: Alone Living conditions (as described by patient or guardian): Pt states that it is fine.  How long has patient lived in current situation?: 12 months What is atmosphere in current home: Comfortable  Family History:  Marital status: Single Are you sexually active?: No What is your sexual orientation?: Heterosexual  Has your sexual activity been affected by drugs, alcohol, medication, or emotional stress?: None reported  Does patient have children?: No  Childhood History:  By whom was/is the patient raised?: Both parents Additional childhood history information: n/a Description of patient's relationship with caregiver when they were a child: Good relationship with parents  Patient's description of current relationship with people who raised him/her: Pt states he close to his parents How were you disciplined when you got in trouble as a child/adolescent?: None reported  Does patient have siblings?: Yes Number of Siblings: 1 Description of patient's current relationship with siblings: 1 sister, close relationship  Did patient suffer any verbal/emotional/physical/sexual abuse as a child?: No Did patient suffer from severe childhood neglect?: No Has patient ever been sexually abused/assaulted/raped as an adolescent or adult?: No Was the patient ever a victim of a crime or a disaster?: No Witnessed  domestic violence?: No Has patient been effected by domestic violence as an adult?: No  Education:  Highest grade of school patient has completed: 12 Currently a student?: No Name of school: na Learning disability?: No  Employment/Work Situation:   Employment situation: On disability Why is patient on disability: Mental health  How long has patient been on disability: about 3 years Patient's job has been impacted by current illness: No What is the longest time patient has a held a job?: 3 years  Where was the patient employed at that time?: [employer]  Has patient ever been in the [employer] Lilly and Companymilitary?: No Has patient ever served in combat?: No Did You Receive Any Psychiatric Treatment/Services While in Equities traderthe Military?: No Are There Guns or Education officer, communityther Weapons in Your Home?: No Are These ComptrollerWeapons Safely Secured?:  (n/a)  Financial Resources:   Surveyor, quantityinancial resources: [employer]ceives SSI, Medicaid, [employer]icare, Support from parents / caregiver Does patient have a Lawyerrepresentative payee or guardian?: Yes Name of representative payee or guardian: Mother Edwin Rheineresa Montes is his payee  Alcohol/Substance Abuse:   What has been your use of drugs/alcohol within the last 12 months?: n/a If attempted suicide, did drugs/alcohol play a role in this?: No Alcohol/Substance Abuse Treatment Hx: Denies past history Has alcohol/substance abuse ever caused legal problems?: No  Social Support System:   Patient's Community Support System: Good Describe Community Support System: Pt has support from his family. Type of faith/religion: n/a How does patient's faith help to cope with current illness?: n/a  Leisure/Recreation:   Leisure and Hobbies: Sports, playing music   Strengths/Needs:   What things does the patient do well?: kind, good at communicating In what areas does patient struggle / problems for patient: depression, anxiety  Discharge Plan:   Does patient have access to transportation?: Yes (mother Edwin Rheinteresa  Montes)  Will patient be returning to same living situation after discharge?: Yes Currently receiving community mental health services: Yes (From Whom) ([facility]) Does patient have financial barriers related to discharge medications?: No  Summary/Recommendations:   Patient is a 31 year old male admitted voluntarily with a diagnosis of Schizophrenia, undifferentiated. Information was obtained from psychosocial assessment completed with patient and chart review conducted by this evaluator. Patient presented to the hospital with concerns of racing thoughts, visual hallucinations, and anxiety. Patient denied any primary triggers for admission. Patient stated that he sees shadows but cannot make out what it is. Patient is currently living in supervised independent living arrangement and has outpatient services and community support team from [facility] in [place]. Patient has support from his family and his mother is his payee. Patient will benefit from crisis stabilization, medication evaluation, group therapy and psycho education in addition to case management for discharge. At discharge, it is recommended that patient remain compliant with established discharge plan and continued treatment.   Edwin Montes, MSW, LCSW-A 03/25/2017, 9:44AM

## 2017-03-26 DIAGNOSIS — F203 Undifferentiated schizophrenia: Principal | ICD-10-CM

## 2017-03-26 LAB — LIPID PANEL
CHOL/HDL RATIO: 4.3 ratio
Cholesterol: 201 mg/dL — ABNORMAL HIGH (ref 0–200)
HDL: 47 mg/dL (ref >40)
LDL CALC: 128 mg/dL — AB (ref 0–99)
Triglycerides: 129 mg/dL (ref <150)
VLDL: 26 mg/dL (ref 0–40)

## 2017-03-26 LAB — TSH: TSH: 2.105 u[IU]/mL (ref 0.350–4.500)

## 2017-03-26 MED ORDER — ARIPIPRAZOLE ER 400 MG IM SRER: 400.0000 mg | INTRAMUSCULAR | Status: DC

## 2017-03-26 NOTE — BHH Group Notes (Signed)
B[facility] LCSW Group Therapy Note  Date/Time: 03/26/17, 1300  Type of Therapy and Topic:  Group Therapy:  Overcoming Obstacles  Participation Level:  minimal  Description of Group:    In this group patients will be encouraged to explore what they see as obstacles to their own wellness and recovery. They will be guided to discuss their thoughts, feelings, and behaviors related to these obstacles. The group will process together ways to cope with barriers, with attention given to specific choices patients can make. Each patient will be challenged to identify changes they are motivated to make in order to overcome their obstacles. This group will be process-oriented, with patients participating in exploration of their own experiences as well as giving and receiving support and challenge from other group members.  Therapeutic Goals: 1. Patient will identify personal and current obstacles as they relate to admission. 2. Patient will identify barriers that currently interfere with their wellness or overcoming obstacles.  3. Patient will identify feelings, thought process and behaviors related to these barriers. 4. Patient will identify two changes they are willing to make to overcome these obstacles:    Summary of Patient Progress: Pt did not participate until specifically called upon by CSW. He then shared appropriately that paranoia and anger were two obstacles that he faces.  He was very withdrawn for the rest of the group.      Therapeutic Modalities:   Cognitive Behavioral Therapy Solution Focused Therapy Motivational Interviewing Relapse Prevention Therapy  Daleen SquibbGreg Loni Abdon, LCSW

## 2017-03-26 NOTE — Plan of Care (Signed)
Problem: Coping: Goal: Ability to verbalize feelings will improve Outcome: Progressing Pt answering direct questions appropriately.

## 2017-03-26 NOTE — Progress Notes (Signed)
Patient walk with a flat affect with minimal interaction with peers and staff.Isolates in his room most of the time.Denies suicidal or homicidal ideations and AV hallucinations.No somatic complaints verbalized.Attended one group.Compliant with medications.Support & encouragement given.

## 2017-03-26 NOTE — Plan of Care (Signed)
Problem: Health Behavior/Discharge Planning: Goal: Compliance with prescribed medication regimen will improve Outcome: Progressing Patient is taking medications as prescribed and agreeable to the current plan of care.

## 2017-03-26 NOTE — B[facility] Group Notes (Signed)
[facility] Group Notes:  (Nursing/MHT/Case Management/Adjunct)  Date:  03/26/2017  Time:  4:09 PM  Type of Therapy:  Psychoeducational Skills  Participation Level:  Did Not Attend  Twanna Hymanda C Donis Pinder 03/26/2017, 4:09 PM

## 2017-03-26 NOTE — Progress Notes (Signed)
[facility] MD Progress Note  03/26/2017 9:14 AM Edwin Montes  MRN:  [id number]  Subjective:  Edwin Montes is a 31 year old male with history of schizophrenia admitted for worsening of psychosis in the context of treatment noncompliance.  03/26/2017. Edwin Montes met with treatment team today. He reports that he felt confused and shortness of breath coming to the hospital. He admits that he has not been compliant with his oral medication. He did receive Abilify remained 10 injection on May 3. He feels much better now and is asking about discharge. His affect is very flat, his responses delay, thinking disorganized. The patient lives independently [facility] He takes medications as prescribed and reports no side effects. There are no somatic complaints.  Per nursing: Pt denies SI/HI/AVH. Medication compliant. Denies pain. Guarded. Isolates to room. Did not attend evening group. Voices no additional concerns at this time. Safety maintained. Will continue to monitor  Principal Problem: Undifferentiated schizophrenia ([place]) Diagnosis:   Patient Active Problem List   Diagnosis Date Noted  . Undifferentiated schizophrenia ([place]) [F20.3] 03/24/2017  . Cannabis use disorder, moderate, dependence ([place]) [F12.20] 11/03/2016  . Panic attack [F41.0] 06/23/2016  . Noncompliance [Z91.19] 02/29/2016  . Tobacco use disorder [F17.200] 01/27/2016   Total Time spent with patient: 30 minutes  Past Psychiatric History: Schizophrenia.  Past Medical History:  Past Medical History:  Diagnosis Date  . Anxiety   . Schizophrenia [facility])     Past Surgical History:  Procedure Laterality Date  . NO PAST SURGERIES     Family History: History reviewed. No pertinent family history. Family Psychiatric  History: See H&P. Social History:  History  Alcohol Use No     History  Drug Use No    Social History   Social History  . Marital status: Single    Spouse name: N/A  . Number of  children: N/A  . Years of education: N/A   Social History Main Topics  . Smoking status: Current Some Day Smoker    Packs/day: 0.50    Types: Cigarettes  . Smokeless tobacco: Never Used  . Alcohol use No  . Drug use: No  . Sexual activity: No   Other Topics Concern  . None   Social History Narrative  . None   Additional Social History:                         Sleep: Fair  Appetite:  Fair  Current Medications: Current Facility-Administered Medications  Medication Dose Route Frequency Provider Last Rate Last Dose  . acetaminophen (TYLENOL) tablet 650 mg  650 mg Oral Q6H PRN Lenward Chancellor, MD      . alum & mag hydroxide-simeth (MAALOX/MYLANTA) 200-200-20 MG/5ML suspension 30 mL  30 mL Oral Q4H PRN Lenward Chancellor, MD      . ARIPiprazole (ABILIFY) tablet 20 mg  20 mg Oral Daily Lenward Chancellor, MD   20 mg at 03/26/17 0815  . fluvoxaMINE (LUVOX) tablet 100 mg  100 mg Oral QHS Lenward Chancellor, MD   100 mg at 03/25/17 2123  . magnesium hydroxide (MILK OF MAGNESIA) suspension 30 mL  30 mL Oral Daily PRN Lenward Chancellor, MD      . traZODone (DESYREL) tablet 50 mg  50 mg Oral QHS PRN Lenward Chancellor, MD        Lab Results:  Results for orders placed or performed during the hospital encounter of 03/24/17 (from the past 48 hour(s))  TSH     Status: None   Collection Time: 03/26/17  6:36 AM  Result Value Ref Range   TSH 2.105 0.350 - 4.500 uIU/mL    Comment: Performed by a 3rd Generation assay with a functional sensitivity of <=0.01 uIU/mL.  Lipid panel     Status: Abnormal   Collection Time: 03/26/17  6:36 AM  Result Value Ref Range   Cholesterol 201 (H) 0 - 200 mg/dL   Triglycerides 129 <150 mg/dL   HDL 47 >40 mg/dL   Total CHOL/HDL Ratio 4.3 RATIO   VLDL 26 0 - 40 mg/dL   LDL Cholesterol 128 (H) 0 - 99 mg/dL    Comment:        Total Cholesterol/HDL:CHD Risk Coronary Heart Disease Risk Table                     Men   Women  1/2 Average Risk   3.4    3.3  Average Risk       5.0   4.4  2 X Average Risk   9.6   7.1  3 X Average Risk  23.4   11.0        Use the calculated Patient Ratio above and the CHD Risk Table to determine the patient's CHD Risk.        ATP III CLASSIFICATION (LDL):  <100     mg/dL   Optimal  100-129  mg/dL   Near or Above                    Optimal  130-159  mg/dL   Borderline  160-189  mg/dL   High  >190     mg/dL   Very High     Blood Alcohol level:  Lab Results  Component Value Date   ETH <5 03/23/2017   ETH <5 93/79/0240    Metabolic Disorder Labs: Lab Results  Component Value Date   HGBA1C 5.2 11/03/2016   MPG 103 11/03/2016   Lab Results  Component Value Date   PROLACTIN 1.7 (L) 01/27/2016   Lab Results  Component Value Date   CHOL 201 (H) 03/26/2017   TRIG 129 03/26/2017   HDL 47 03/26/2017   CHOLHDL 4.3 03/26/2017   VLDL 26 03/26/2017   LDLCALC 128 (H) 03/26/2017   LDLCALC 141 (H) 11/03/2016    Physical Findings: AIMS:  , ,  ,  ,    CIWA:  CIWA-Ar Total: 0 COWS:     Musculoskeletal: Strength & Muscle Tone: within normal limits Gait & Station: normal Patient leans: N/A  Psychiatric Specialty Exam: Physical Exam  Nursing note and vitals reviewed. Psychiatric: Thought content normal. His affect is blunt. His speech is delayed. He is slowed and withdrawn. Cognition and memory are normal. He expresses impulsivity.    Review of Systems  Psychiatric/Behavioral: Positive for depression and hallucinations.  All other systems reviewed and are negative.   Blood pressure 107/67, pulse 61, temperature 98.4 F (36.9 C), temperature source Oral, resp. rate 18, height '6\' 2"'  (1.88 m), weight 94.8 kg (209 lb), SpO2 97 %.Body mass index is 26.83 kg/m.  General Appearance: Casual  Eye Contact:  Fair  Speech:  Slow  Volume:  Normal  Mood:  Depressed  Affect:  Blunt  Thought Process:  Disorganized and Descriptions of Associations: Tangential  Orientation:  Full (Time, Place, and  Person)  Thought Content:  Hallucinations: Auditory  Suicidal Thoughts:  No  Homicidal Thoughts:  No  Memory:  Immediate;   Fair Recent;   Fair Remote;   Fair  Judgement:  Impaired  Insight:  Shallow  Psychomotor Activity:  Decreased  Concentration:  Concentration: Fair and Attention Span: Fair  Recall:  [employer] of Knowledge:  Fair  Language:  Fair  Akathisia:  No  Handed:  Right  AIMS (if indicated):     Assets:  Communication Skills Desire for Improvement Financial Resources/Insurance Housing Physical Health Resilience Social Support  ADL's:  Intact  Cognition:  WNL  Sleep:  Number of Hours: 8.15     Treatment Plan Summary: Daily contact with patient to assess and evaluate symptoms and progress in treatment and Medication management   Edwin Montes is a 32 year old male with schizophrenia admitted for anxiety and hallucinations due to not being compliant with oral Abilify.  1. Schizophrenia. Continue Abilify 20 mg a day along with Abilify injectable 400 mg every 30 days. Last injection on 03/15/2017.   2. Anxiety and possibly panic disorder. Patient is on Luvox 100 mg by mouth daily at bedtime.   3. Insomnia. The patient will be continued and trazodone 50 mg by mouth daily at bedtime when necessary  4. Cannabis use disorder. Patient has received education about the negative effects of cannabis in schizophrenia  5. Tobacco use disorder. Nicotine patch is available.   6. Metabolic syndrome monitoring. Hemoglobin A1c, lipid panel and TSH are pending.  7. EKG. Pending.  8. Disposition. He will be discharged back to his apartment. He will follow up with [facility] and continue in day program.   Orson Slick, MD 03/26/2017, 9:14 AM

## 2017-03-26 NOTE — Tx Team (Signed)
Interdisciplinary Treatment and Diagnostic Plan Update  03/26/2017 Time of Session: 10:30 AM Edwin Montes MRN: [id number]  Principal Diagnosis: Undifferentiated schizophrenia [facility])  Secondary Diagnoses: Principal Problem:   Undifferentiated schizophrenia (HCC) Active Problems:   Tobacco use disorder   Noncompliance   Cannabis use disorder, moderate, dependence (HCC)   Current Medications:  Current Facility-Administered Medications  Medication Dose Route Frequency Provider Last Rate Last Dose  . acetaminophen (TYLENOL) tablet 650 mg  650 mg Oral Q6H PRN Beverly Sessions, MD      . alum & mag hydroxide-simeth (MAALOX/MYLANTA) 200-200-20 MG/5ML suspension 30 mL  30 mL Oral Q4H PRN Beverly Sessions, MD      . ARIPiprazole (ABILIFY) tablet 20 mg  20 mg Oral Daily Beverly Sessions, MD   20 mg at 03/26/17 0815  . [START ON 04/12/2017] ARIPiprazole ER SRER 400 mg  400 mg Intramuscular Q28 days Pucilowska, Jolanta B, MD      . fluvoxaMINE (LUVOX) tablet 100 mg  100 mg Oral QHS Beverly Sessions, MD   100 mg at 03/25/17 2123  . magnesium hydroxide (MILK OF MAGNESIA) suspension 30 mL  30 mL Oral Daily PRN Beverly Sessions, MD      . traZODone (DESYREL) tablet 50 mg  50 mg Oral QHS PRN Beverly Sessions, MD       PTA Medications: Prescriptions Prior to Admission  Medication Sig Dispense Refill Last Dose  . ARIPiprazole (ABILIFY) 20 MG tablet Take 1 tablet (20 mg total) by mouth daily. 30 tablet 0   . ARIPiprazole ER 400 MG SUSR Inject 400 mg into the muscle every 28 (twenty-eight) days. Due on January 19 1 each 1   . fluvoxaMINE (LUVOX) 100 MG tablet Take 1 tablet (100 mg total) by mouth at bedtime. 30 tablet 1   . LORazepam (ATIVAN) 2 MG tablet Take 1 tablet (2 mg total) by mouth at bedtime as needed for anxiety. 30 tablet 0     Patient Stressors: Health problems Medication change or noncompliance Substance abuse  Patient Strengths: Ability for insight Communication  skills Supportive family/friends  Treatment Modalities: Medication Management, Group therapy, Case management,  1 to 1 session with clinician, Psychoeducation, Recreational therapy.   Physician Treatment Plan for Primary Diagnosis: Undifferentiated schizophrenia (HCC) Long Term Goal(s): Improvement in symptoms so as ready for discharge Improvement in symptoms so as ready for discharge   Short Term Goals: Ability to identify changes in lifestyle to reduce recurrence of condition will improve Compliance with prescribed medications will improve Ability to identify and develop effective coping behaviors will improve  Medication Management: Evaluate patient's response, side effects, and tolerance of medication regimen.  Therapeutic Interventions: 1 to 1 sessions, Unit Group sessions and Medication administration.  Evaluation of Outcomes: Progressing  Physician Treatment Plan for Secondary Diagnosis: Principal Problem:   Undifferentiated schizophrenia (HCC) Active Problems:   Tobacco use disorder   Noncompliance   Cannabis use disorder, moderate, dependence (HCC)  Long Term Goal(s): Improvement in symptoms so as ready for discharge Improvement in symptoms so as ready for discharge   Short Term Goals: Ability to identify changes in lifestyle to reduce recurrence of condition will improve Compliance with prescribed medications will improve Ability to identify and develop effective coping behaviors will improve     Medication Management: Evaluate patient's response, side effects, and tolerance of medication regimen.  Therapeutic Interventions: 1 to 1 sessions, Unit Group sessions and Medication administration.  Evaluation of Outcomes: Progressing   RN Treatment Plan for Primary Diagnosis: Undifferentiated schizophrenia (  HCC) Long Term Goal(s): Knowledge of disease and therapeutic regimen to maintain health will improve  Short Term Goals: Ability to verbalize feelings will improve  and Compliance with prescribed medications will improve  Medication Management: RN will administer medications as ordered by provider, will assess and evaluate patient's response and provide education to patient for prescribed medication. RN will report any adverse and/or side effects to prescribing provider.  Therapeutic Interventions: 1 on 1 counseling sessions, Psychoeducation, Medication administration, Evaluate responses to treatment, Monitor vital signs and CBGs as ordered, Perform/monitor CIWA, COWS, AIMS and Fall Risk screenings as ordered, Perform wound care treatments as ordered.  Evaluation of Outcomes: Progressing   LCSW Treatment Plan for Primary Diagnosis: Undifferentiated schizophrenia (HCC) Long Term Goal(s): Safe transition to appropriate next level of care at discharge, Engage patient in therapeutic group addressing interpersonal concerns.  Short Term Goals: Engage patient in aftercare planning with referrals and resources, Increase social support and Increase skills for wellness and recovery  Therapeutic Interventions: Assess for all discharge needs, 1 to 1 time with Social worker, Explore available resources and support systems, Assess for adequacy in community support network, Educate family and significant other(s) on suicide prevention, Complete Psychosocial Assessment, Interpersonal group therapy.  Evaluation of Outcomes: Progressing   Progress in Treatment: Attending groups: Yes. Participating in groups: Yes. Taking medication as prescribed: Yes. Toleration medication: Yes. Family/Significant other contact made: Yes, individual(s) contacted:  CSW spoke with patient's mother. Patient understands diagnosis: Yes. Discussing patient identified problems/goals with staff: Yes. Medical problems stabilized or resolved: Yes. Denies suicidal/homicidal ideation: Yes. Issues/concerns per patient self-inventory: No.  New problem(s) identified: No, Describe:  None  identfied.  New Short Term/Long Term Goal(s): Patient stated goal was to start Abilify and to feel better.  Discharge Plan or Barriers: Patient will return to his home and follow-up with [facility].   Reason for Continuation of Hospitalization: Delusions  Hallucinations Medication stabilization  Estimated Length of Stay: 1 day   Attendees: Patient: Edwin Montes  03/26/2017 10:47 AM  Physician: Dr. Kristine LineaJolanta Pucilowska, MD  03/26/2017 10:47 AM  Nursing: Leonia ReaderPhyllis Cobb, BSN, RN  03/26/2017 10:47 AM  RN Care Manager: 03/26/2017 10:47 AM  Social Worker: Hampton AbbotKadijah Roseann Kees, MSW, LCSW-A 03/26/2017 10:47 AM  Recreational Therapist:  03/26/2017 10:47 AM  Other:  03/26/2017 10:47 AM  Other:  03/26/2017 10:47 AM  Other: 03/26/2017 10:47 AM    Scribe for Treatment Team: Lynden OxfordKadijah R Debar Plate, LCSWA 03/26/2017 10:47 AM

## 2017-03-26 NOTE — Plan of Care (Signed)
Problem: Activity: Goal: Will verbalize the importance of balancing activity with adequate rest periods Outcome: Not Progressing Encouraged patient to attended groups.

## 2017-03-26 NOTE — Progress Notes (Signed)
Nursing Progress Note [id number]  D) Patient presents with flat affect and is guarded/minimal with Clinical research associatewriter. Patient is isolative to room. Patient states "nothing has changed". Patient denies SI/HI or pain but endorses AVH. Patient contracts for safety on the unit. Patient reports sleeping well with current regimen.  A)  Emotional support given. 1:1 interaction and active listening provided. Patient medicated as prescribed. Medications and plan of care reviewed with patient. Patient verbalized understanding without further questions.  Snacks and fluids provided. Opportunities for questions or concerns presented to patient. Patient encouraged to continue to work on treatment goals. Labs, vital signs and patient behavior monitored throughout shift. Patient safety maintained with q15 min safety checks. Low fall risk precautions in place and reviewed with patient; patient verbalized understanding.  R) Patient receptive to interaction with nurse. Patient remains safe on the unit at this time. Patient denies any adverse medication reactions at this time. Patient is resting in bed without complaints. Will continue to monitor.

## 2017-03-27 LAB — HEMOGLOBIN A1C
Hgb A1c MFr Bld: 5.2 % (ref 4.8–5.6)
Mean Plasma Glucose: 103 mg/dL

## 2017-03-27 MED ORDER — FLUVOXAMINE MALEATE 100 MG PO TABS: 100.0000 mg | ORAL_TABLET | Freq: Every day | ORAL | 1 refills | Status: DC

## 2017-03-27 MED ORDER — ARIPIPRAZOLE 20 MG PO TABS: 20.0000 mg | ORAL_TABLET | Freq: Every day | ORAL | 1 refills | Status: DC

## 2017-03-27 NOTE — Progress Notes (Signed)
  [facility] :  Will you be returning to the same living situation after discharge:  Yes,  own apartment At discharge, do you have transportation home?: Yes,  community support worker Do you have the ability to pay for your medications: Yes,  medicare  Release of information consent forms completed and in the chart;  Patient's signature needed at discharge.  Patient to Follow up at: Follow-up Information    [facility] Go on 03/29/2017.   Why:  Please attend your hospital discharge appointment on Thursday, 03/29/17 at 12:30pm.  Please bring a copy of your hospital discharge paperwork. Contact information: [address] [place] [id number] [phone]           Next level of care provider [facility] Link:no  Safety Planning and Suicide Prevention discussed: Yes,  mother  Have you used any form of tobacco in the last 30 days? (Cigarettes, Smokeless Tobacco, Cigars, and/or Pipes): Yes  Has patient been referred to the Quitline?: Yes, faxed on 03/27/17  Patient has been referred for addiction treatment: Yes  Lorri FrederickWierda, Faun Mcqueen Jon, LCSW 03/27/2017, 12:59 PM

## 2017-03-27 NOTE — Progress Notes (Signed)
Affect flat.  Denies SI/HI/AVH.   Discharge instructions given, verbalized understanding.  Prescriptions given and personal belongings returned.  Escorted off unit by this Clinical research associatewriter to meet community support person to travel home.

## 2017-03-27 NOTE — Progress Notes (Signed)
Recreation Therapy Notes  Date: 05.15.18 Time: 9:30 am Location: Craft Room  Group Topic: Self-expression  Goal Area(s) Addresses:  Patient will effectively use art as a means of self-expression. Patient will recognize positive benefit of self-expression. Patient will be able to identify one emotion experienced during group session. Patient will identify use of art as a coping skill.  Behavioral Response: Inattentive  Intervention: Two Faces of Me  Activity: Patients were given blank face worksheets and were instructed to draw a line down the middle. On one side of the worksheet, patients were instructed to draw or write how they felt when they were admitted to the hospital. On the other side, patients were instructed to draw or write how they want to feel when they are d/c.  Education: LRT educated patients on other forms of self-expression.  Education Outcome: In group clarification offered   Clinical Observations/Feedback: Patient did not participate in activity. Patient did not contribute to group discussion. Patient looked like he was praying at one point in group.  Jacquelynn CreeGreene,Tyjay Galindo M, LRT/CTRS 03/27/2017 10:20 AM

## 2017-03-27 NOTE — Discharge Summary (Signed)
Physician Discharge Summary Note  Patient:  Edwin Montes is an 31 y.o., male MRN:  [id number] DOB:  [birth year] Patient phone:  [phone] (home)  Patient address:   [address] [place] [address],  Total Time spent with patient: 30 minutes  Date of Admission:  03/24/2017 Date of Discharge: 03/27/2017.  Reason for Admission:  Psychotic break.  Patient is a 31 year old single Caucasian male who carries a diagnosis of schizophrenia. The patient presented via ambulance to our emergency department on May 11. The patient reported that he was noncompliant with his oral dose of Abilify had been is keeping some doses.  Patient reported that he was having hallucinations, mainly seeing shadows and therefore he felt he needed to come into the hospital. Per the ER notes patient appeared to be interacting to internal stimuli.  Patient tells me he had a panic attack as well. He says that he has episodes of shortness of breath and chest pain once in a while without having any clear triggers.  He is a limited historian. Says that [facility] and has been going to country club day program which he enjoys.  In addition to taking oral Abilify the patient is also receiving Abilify injectable 400 mg every month. Says that he received his last injection at the beginning of May.  Today he denies having suicidality, homicidality or side effects from medications. Says that he continues to see shadows but not as bad as yesterday. He denies having any auditory hallucinations.  He denies the abuse of any substances or alcohol. Urine toxicology was positive for analyst. Also per chart looks like he has a history of cannabis abuse. His alcohol level at arrival was below detection limit.  Associated Signs/Symptoms: Depression Symptoms:  denies (Hypo) Manic Symptoms:  denies Anxiety Symptoms:  Excessive Worry, Panic Symptoms, Psychotic Symptoms:  Hallucinations:  Visual PTSD Symptoms: NA  Past Psychiatric History: Diagnosed with schizophrenia in his early 7320s. Currently treated with Abilify injectable every month and oral Abilify. He is also on Luvox 100 mg a day. Patient follows up with [facility] and goes to a day program  Family Psychiatric  History: Denies any family history of mental illness, substance abuse or suicide  Social History: Patient lives alone, is single, never married doesn't have any children. He receives disability for mental illness   Principal Problem: Undifferentiated schizophrenia [facility](HCC) Discharge Diagnoses: Patient Active Problem List   Diagnosis Date Noted  . Undifferentiated schizophrenia (HCC) [F20.3] 03/24/2017  . 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 Panic attack [F41.0] 06/23/2016  . Noncompliance [Z91.19] 02/29/2016  . Tobacco use disorder [F17.200] 01/27/2016   Past Medical History:  Past Medical History:  Diagnosis Date  . Anxiety   . Schizophrenia [facility](HCC)     Past Surgical History:  Procedure Laterality Date  . NO PAST SURGERIES     Family History: History reviewed. No pertinent family history.  Social History:  History  Alcohol Use No     History  Drug Use No    Social History   Social History  . Marital status: Single    Spouse name: N/A  . Number of children: N/A  . Years of education: N/A   Social History Main Topics  . Smoking status: Current Some Day Smoker    Packs/day: 0.50    Types: Cigarettes  . Smokeless tobacco: Never Used  . Alcohol use No  . Drug use: No  .  Sexual activity: No   Other Topics Concern  . None   Social History Narrative  . None    Hospital Course:    [name] is a 31 year old male with schizophrenia admitted for anxiety and hallucinations due to medication noncompliance.   1. Schizophrenia. Continue Abilify 20 mg a day along with Abilify injectable 400 mg every 30 days. Last injection on 03/15/2017. Hallucinations have  decreased but not resolved completely.  2. Anxiety. We restarted Luvox.    3. Insomnia. Trazodone was available.   4. Cannabis use disorder. Patient has received education about the negative effects of cannabis in schizophrenia  5. Tobacco use disorder. Nicotine patch was available.   6. Metabolic syndrome monitoring. Hemoglobin A1c, lipid panel and TSH are normal.   7. EKG. Normal sinus rhythm. QTc 394.  8. Disposition. He was discharged back to his apartment. He will follow up with [facility] psychiatrist, peer support and day program.  Physical Findings: AIMS: Facial and Oral Movements Muscles of Facial Expression: None, normal Lips and Perioral Area: None, normal Jaw: None, normal Tongue: None, normal,Extremity Movements Upper (arms, wrists, hands, fingers): None, normal Lower (legs, knees, ankles, toes): None, normal, Trunk Movements Neck, shoulders, hips: None, normal, Overall Severity Severity of abnormal movements (highest score from questions above): None, normal Incapacitation due to abnormal movements: None, normal Patient's awareness of abnormal movements (rate only patient's report): No Awareness, Dental Status Current problems with teeth and/or dentures?: No Does patient usually wear dentures?: No  CIWA:  CIWA-Ar Total: 0 COWS:     Musculoskeletal: Strength & Muscle Tone: within normal limits Gait & Station: normal Patient leans: N/A  Psychiatric Specialty Exam: Physical Exam  Nursing note and vitals reviewed. Psychiatric: He has a normal mood and affect. His speech is normal. Judgment and thought content normal. He is actively hallucinating. Cognition and memory are normal.    Review of Systems  Psychiatric/Behavioral: Positive for hallucinations.  All other systems reviewed and are negative.   Blood pressure 107/71, pulse (!) 57, temperature 98 F (36.7 C), temperature source Oral, resp. rate 17, height 6\' 2"  (1.88 m), weight 94.8 kg (209 lb), SpO2 97  %.Body mass index is 26.83 kg/m.  General Appearance: Casual  Eye Contact:  Good  Speech:  Clear and Coherent  Volume:  Normal  Mood:  Euthymic  Affect:  Blunt  Thought Process:  Goal Directed and Descriptions of Associations: Intact  Orientation:  Full (Time, Place, and Person)  Thought Content:  Hallucinations: Auditory  Suicidal Thoughts:  No  Homicidal Thoughts:  No  Memory:  Immediate;   Fair Recent;   Fair Remote;   Fair  Judgement:  Impaired  Insight:  Lacking  Psychomotor Activity:  Normal  Concentration:  Concentration: Fair and Attention Span: Fair  Recall:  [employer] of Knowledge:  Fair  Language:  Fair  Akathisia:  No  Handed:  Right  AIMS (if indicated):     Assets:  Communication Skills Desire for Improvement Financial Resources/Insurance Housing Physical Health Resilience Social Support  ADL's:  Intact  Cognition:  WNL  Sleep:  Number of Hours: 6.75     Have you used any form of tobacco in the last 30 days? (Cigarettes, Smokeless Tobacco, Cigars, and/or Pipes): Yes  Has this patient used any form of tobacco in the last 30 days? (Cigarettes, Smokeless Tobacco, Cigars, and/or Pipes) Yes, Yes, A prescription for an FDA-approved tobacco cessation medication was offered at discharge and the patient refused  Blood Alcohol level:  Lab Results  Component Value Date   ETH <5 03/23/2017   ETH <5 02/15/2017    Metabolic Disorder Labs:  Lab Results  Component Value Date   HGBA1C 5.2 03/26/2017   MPG 103 03/26/2017   MPG 103 11/03/2016   Lab Results  Component Value Date   PROLACTIN 1.7 (L) 01/27/2016   Lab Results  Component Value Date   CHOL 201 (H) 03/26/2017   TRIG 129 03/26/2017   HDL 47 03/26/2017   CHOLHDL 4.3 03/26/2017   VLDL 26 03/26/2017   LDLCALC 128 (H) 03/26/2017   LDLCALC 141 (H) 11/03/2016    See Psychiatric Specialty Exam and Suicide Risk Assessment completed by Attending Physician prior to discharge.  Discharge destination:   Home  Is patient on multiple antipsychotic therapies at discharge:  No   Has Patient had three or more failed trials of antipsychotic monotherapy by history:  No  Recommended Plan for Multiple Antipsychotic Therapies: NA  Discharge Instructions    Diet - low sodium heart healthy    Complete by:  As directed    Increase activity slowly    Complete by:  As directed      Allergies as of 03/27/2017   No Known Allergies     Medication List    STOP taking these medications   hydrOXYzine 25 MG tablet Commonly known as:  ATARAX/VISTARIL   LORazepam 2 MG tablet Commonly known as:  ATIVAN     TAKE these medications     Indication  ARIPiprazole ER 400 MG Susr Inject 400 mg into the muscle every 28 (twenty-eight) days. Due on January 19  Indication:  Schizophrenia   ARIPiprazole 20 MG tablet Commonly known as:  ABILIFY Take 1 tablet (20 mg total) by mouth daily.  Indication:  Schizophrenia   fluvoxaMINE 100 MG tablet Commonly known as:  LUVOX Take 1 tablet (100 mg total) by mouth at bedtime.  Indication:  Obsessive Compulsive Disorder, Panic Disorder        Follow-up recommendations:  Activity:  as tolerated. Diet:  low sodium heart healthy. Other:  keep follow up appointment.  Comments:    Signed: Kristine Linea, MD 03/27/2017, 11:33 AM

## 2017-03-27 NOTE — [facility] Suicide Risk Assessment (Signed)
[facility]   Principal Problem: Undifferentiated schizophrenia [facility](HCC) Discharge Diagnoses:  Patient Active Problem List   Diagnosis Date Noted  . Undifferentiated schizophrenia (HCC) [F20.3] 03/24/2017  . 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 Panic attack [F41.0] 06/23/2016  . Noncompliance [Z91.19] 02/29/2016  . Tobacco use disorder [F17.200] 01/27/2016    Total Time spent with patient: 30 minutes  Musculoskeletal: Strength & Muscle Tone: within normal limits Gait & Station: normal Patient leans: N/A  Psychiatric Specialty Exam: Review of Systems  Psychiatric/Behavioral: Positive for hallucinations.  All other systems reviewed and are negative.   Blood pressure 107/71, pulse (!) 57, temperature 98 F (36.7 C), temperature source Oral, resp. rate 17, height 6\' 2"  (1.88 m), weight 94.8 kg (209 lb), SpO2 97 %.Body mass index is 26.83 kg/m.  General Appearance: Casual  Eye Contact::  Good  Speech:  Clear and Coherent409  Volume:  Normal  Mood:  Euthymic  Affect:  Blunt  Thought Process:  Goal Directed and Descriptions of Associations: Intact  Orientation:  Full (Time, Place, and Person)  Thought Content:  Hallucinations: Auditory  Suicidal Thoughts:  No  Homicidal Thoughts:  No  Memory:  Immediate;   Fair Recent;   Fair Remote;   Fair  Judgement:  Impaired  Insight:  Lacking  Psychomotor Activity:  Normal  Concentration:  Fair  Recall:  [employer]  Fund of Knowledge:Fair  Language: Fair  Akathisia:  No  Handed:  Right  AIMS (if indicated):     Assets:  Communication Skills Desire for Improvement Financial Resources/Insurance Housing Physical Health Resilience Social Support  Sleep:  Number of Hours: 6.75  Cognition: WNL  ADL's:  Intact   Mental Status Per Nursing Assessment::   On Admission:  NA  Demographic Factors:  Male, Adolescent or young adult, Caucasian and Living alone  Loss  Factors: NA  Historical Factors: Prior suicide attempts, Family history of mental illness or substance abuse and Impulsivity  Risk Reduction Factors:   Sense of responsibility to family, Positive social support and Positive therapeutic relationship  Continued Clinical Symptoms:  Schizophrenia:   Depressive state Less than 29 years old Paranoid or undifferentiated type  Cognitive Features That Contribute To Risk:  None    Suicide Risk:  Minimal: No identifiable suicidal ideation.  Patients presenting with no risk factors but with morbid ruminations; may be classified as minimal risk based on the severity of the depressive symptoms    Plan Of Care/Follow-up recommendations:  Activity:  as tolerated. Diet:  low sodium heart healthy. Other:  keep follow up appointments.  Kristine LineaJolanta Maks Cavallero, MD 03/27/2017, 11:29 AM

## 2017-03-27 NOTE — [facility] Group Notes (Signed)
[facility] Group Notes:  (Nursing/MHT/Case Management/Adjunct)  Date:  03/27/2017  Time:  2:55 PM  Type of Therapy:  Psychoeducational Skills  Participation Level:  Did Not Attend  Edwin Montes 03/27/2017, 2:55 PM

## 2017-06-25 ENCOUNTER — Emergency Department
Admission: EM | Admit: 2017-06-25 | Discharge: 2017-06-26 | Disposition: A | Discharge status: Other Acute Inpatient Hospital | Payer: Medicare Other | Attending: Emergency Medicine | Admitting: Emergency Medicine

## 2017-06-25 DIAGNOSIS — Z79899 Other long term (current) drug therapy: Secondary | ICD-10-CM | POA: Diagnosis not present

## 2017-06-25 DIAGNOSIS — F22 Delusional disorders: Secondary | ICD-10-CM | POA: Diagnosis present

## 2017-06-25 DIAGNOSIS — F17210 Nicotine dependence, cigarettes, uncomplicated: Secondary | ICD-10-CM | POA: Diagnosis not present

## 2017-06-25 DIAGNOSIS — F209 Schizophrenia, unspecified: Secondary | ICD-10-CM | POA: Diagnosis not present

## 2017-06-25 DIAGNOSIS — R441 Visual hallucinations: Secondary | ICD-10-CM

## 2017-06-25 DIAGNOSIS — F23 Brief psychotic disorder: Secondary | ICD-10-CM

## 2017-06-25 DIAGNOSIS — F29 Unspecified psychosis not due to a substance or known physiological condition: Secondary | ICD-10-CM | POA: Insufficient documentation

## 2017-06-25 LAB — COMPREHENSIVE METABOLIC PANEL
ALT: 17 U/L (ref 17–63)
AST: 27 U/L (ref 15–41)
Albumin: 4.3 g/dL (ref 3.5–5.0)
Alkaline Phosphatase: 54 U/L (ref 38–126)
Anion gap: 7 (ref 5–15)
BUN: 14 mg/dL (ref 6–20)
CHLORIDE: 107 mmol/L (ref 101–111)
CO2: 26 mmol/L (ref 22–32)
Calcium: 8.8 mg/dL — ABNORMAL LOW (ref 8.9–10.3)
Creatinine, Ser: 0.96 mg/dL (ref 0.61–1.24)
GFR calc Af Amer: >60 mL/min (ref >60)
GFR calc non Af Amer: >60 mL/min (ref >60)
Glucose, Bld: 102 mg/dL — ABNORMAL HIGH (ref 65–99)
Potassium: 4 mmol/L (ref 3.5–5.1)
SODIUM: 140 mmol/L (ref 135–145)
Total Bilirubin: 0.7 mg/dL (ref 0.3–1.2)
Total Protein: 6.6 g/dL (ref 6.5–8.1)

## 2017-06-25 LAB — ETHANOL: Alcohol, Ethyl (B): <5 mg/dL (ref <5)

## 2017-06-25 LAB — CBC
HCT: 42 % (ref 40.0–52.0)
HEMOGLOBIN: 14.7 g/dL (ref 13.0–18.0)
MCH: 31 pg (ref 26.0–34.0)
MCHC: 35 g/dL (ref 32.0–36.0)
MCV: 88.4 fL (ref 80.0–100.0)
Platelets: 267 10*3/uL (ref 150–440)
RBC: 4.75 MIL/uL (ref 4.40–5.90)
RDW: 13 % (ref 11.5–14.5)
WBC: 7 10*3/uL (ref 3.8–10.6)

## 2017-06-25 LAB — SALICYLATE LEVEL: Salicylate Lvl: <7 mg/dL (ref 2.8–30.0)

## 2017-06-25 LAB — ACETAMINOPHEN LEVEL: Acetaminophen (Tylenol), Serum: <10 ug/mL — ABNORMAL LOW (ref 10–30)

## 2017-06-25 NOTE — ED Triage Notes (Addendum)
Pt here via [facility] with c/o shortness of breath today. Feels like he's anxious, paranoia, racing thoughts. Pt with rapid blinking in triage. Pt unwilling to answer questions about paranoia; pt report he hasn't taken his abilify or luvox in 9 days after multiple instances of asking. Pt denies SI or HI.

## 2017-06-25 NOTE — ED Notes (Signed)
Pt was "dressed out" while in triage. Items removed and bagged include one pair of shoes, one pair of socks, one pair of pants, one pair of underwear, one shirt, one set of keys on key ring, one black wallet.

## 2017-06-25 NOTE — ED Provider Notes (Signed)
[facility] Emergency Department Provider Note  ____________________________________________  Time seen: Approximately 9:04 PM  I have reviewed the triage vital signs and the nursing notes.   HISTORY  Chief Complaint Paranoid    HPI Jules Baty is a 31 y.o. male who complains of anxiousness paranoia and racing thoughts that started yesterday. He reports this is happened in the past. He had been taking Luvox 15 and Abilify, but he discontinued them about 9 days ago because they made him feel drowsy and he didn't like that. Via EMS he had reported shortness of breath earlier, but he denies that now. No exertional symptoms.Symptoms a been constant since onset. No aggravating or alleviating factors. Moderate intensity.     Past Medical History:  Diagnosis Date  . Anxiety   . Schizophrenia [facility])      Patient Active Problem List   Diagnosis Date Noted  . 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 Cannabis use disorder, moderate, dependence (HCC) 11/03/2016  . Panic attack 06/23/2016  . Noncompliance 02/29/2016  . Tobacco use disorder 01/27/2016     Past Surgical History:  Procedure Laterality Date  . NO PAST SURGERIES       Prior to Admission medications   Medication Sig Start Date End Date Taking? Authorizing Provider  ARIPiprazole (ABILIFY) 20 MG tablet Take 1 tablet (20 mg total) by mouth daily. 03/27/17   Pucilowska, Jolanta B, MD  ARIPiprazole ER 400 MG SUSR Inject 400 mg into the muscle every 28 (twenty-eight) days. Due on January 19 12/01/16   Jimmy Footman, MD  fluvoxaMINE (LUVOX) 100 MG tablet Take 1 tablet (100 mg total) by mouth at bedtime. 03/27/17   Pucilowska, Ellin Goodie, MD     Allergies Patient has no known allergies.   No family history on file.  Social History Social History  Substance Use Topics  . Smoking status: Current Some Day Smoker    Packs/day: 0.50    Types: Cigarettes  . Smokeless  tobacco: Never Used  . Alcohol use No    Review of Systems  Constitutional:   No fever or chills.  ENT:   No sore throat. No rhinorrhea. Cardiovascular:   No chest pain or syncope. Respiratory:   No dyspnea or cough. Gastrointestinal:   Negative for abdominal pain, vomiting and diarrhea.  Musculoskeletal:   Negative for focal pain or swelling All other systems reviewed and are negative except as documented above in ROS and HPI.  ____________________________________________   PHYSICAL EXAM:  VITAL SIGNS: ED Triage Vitals [06/25/17 1825]  Enc Vitals Group     BP 137/84     Pulse Rate 80     Resp 19     Temp 98.7 F (37.1 C)     Temp Source Oral     SpO2 98 %     Weight 200 lb (90.7 kg)     Height      Head Circumference      Peak Flow      Pain Score 0     Pain Loc      Pain Edu?      Excl. in GC?     Vital signs reviewed, nursing assessments reviewed.   Constitutional:   Alert and oriented. Well appearing and in no distress. Eyes:   No scleral icterus.  EOMI. No nystagmus. No conjunctival pallor. PERRL. ENT   Head:   Normocephalic and atraumatic.   Nose:   No congestion/rhinnorhea.    Mouth/Throat:   MMM, no  pharyngeal erythema. No peritonsillar mass.    Neck:   No meningismus. Full ROM Hematological/Lymphatic/Immunilogical:   No cervical lymphadenopathy. Cardiovascular:   RRR. Symmetric bilateral radial and DP pulses.  No murmurs.  Respiratory:   Normal respiratory effort without tachypnea/retractions. Breath sounds are clear and equal bilaterally. No wheezes/rales/rhonchi. Gastrointestinal:   Soft and nontender. Non distended. There is no CVA tenderness.  No rebound, rigidity, or guarding. Genitourinary:   deferred Musculoskeletal:   Normal range of motion in all extremities. No joint effusions.  No lower extremity tenderness.  No edema. Neurologic:   Normal speech and language.  Motor grossly intact. No gross focal neurologic deficits are  appreciated.  Psychiatric: Flat affect, reserved Visual hallucinations Appears to be having difficulty organizing his thoughts, possibly expressing internal stimuli. Skin:    Skin is warm, dry and intact. No rash noted.  No petechiae, purpura, or bullae.  ____________________________________________    LABS (pertinent positives/negatives) (all labs ordered are listed, but only abnormal results are displayed) Labs Reviewed  COMPREHENSIVE METABOLIC PANEL - Abnormal; Notable for the following:       Result Value   Glucose, Bld 102 (*)    Calcium 8.8 (*)    All other components within normal limits  ACETAMINOPHEN LEVEL - Abnormal; Notable for the following:    Acetaminophen (Tylenol), Serum <10 (*)    All other components within normal limits  ETHANOL  SALICYLATE LEVEL  CBC  URINE DRUG SCREEN, QUALITATIVE ([facility] ONLY)   ____________________________________________   EKG    ____________________________________________    RADIOLOGY  No results found.  ____________________________________________   PROCEDURES Procedures  ____________________________________________   INITIAL IMPRESSION / ASSESSMENT AND PLAN / ED COURSE  Pertinent labs & imaging results that were available during my care of the patient were reviewed by me and considered in my medical decision making (see chart for details).  Patient presents with visual hallucinations paranoia and negative symptoms of schizophrenia consistent with his history of schizophrenia after stopping his medications 9 days ago due to not liking how they made him feel drowsy. No acute medical complaints, normal vital signs. Medically stable. We'll get a psych consult. Start involuntary commitment due to psychosis. We'll follow-up psychiatry recommendations.      ____________________________________________   FINAL CLINICAL IMPRESSION(S) / ED DIAGNOSES  Final diagnoses:  Schizophrenia, unspecified type (HCC)  Visual  hallucination  Acute psychosis      New Prescriptions   No medications on file     Portions of this note were generated with dragon dictation software. Dictation errors may occur despite best attempts at proofreading.    Sharman CheekStafford, Deniss Wormley, MD 06/25/17 2106

## 2017-06-25 NOTE — ED Notes (Signed)

## 2017-06-25 NOTE — BH Assessment (Signed)
Assessment Note  Edwin Montes is an 31 y.o. male. Edwin Montes arrived to the ED by way of EMS.  He reports that he feeling anxious.  He shared that he was having shortness of breath, racing thoughts, and not seeing things clear.  He states that he had some confusion as well.  He denied symptoms of depression. He reports that he is seeing shadows that are just there. He denied having auditory hallucinations.  He reports some paranoid ideation. He denied suicidal or homicidal ideation or intent.  He denied the use of alcohol or drugs.  He states that he is currently on medications that he has not taken in the past 8-9 days.  Patient denied hearing voices, but appeared to be reacting to internal stimuli.   Diagnosis: Schizophrenia, Paranoid ideation  Past Medical History:  Past Medical History:  Diagnosis Date  . Anxiety   . Schizophrenia [facility])     Past Surgical History:  Procedure Laterality Date  . NO PAST SURGERIES      Family History: No family history on file.  Social History:  reports that he has been smoking Cigarettes.  He has been smoking about 0.50 packs per day. He has never used smokeless tobacco. He reports that he does not drink alcohol or use drugs.  Additional Social History:  Alcohol / Drug Use History of alcohol / drug use?: No history of alcohol / drug abuse  CIWA: CIWA-Ar BP: 107/70 Pulse Rate: 67 COWS:    Allergies: No Known Allergies  Home Medications:  (Not in a hospital admission)  OB/GYN Status:  No LMP for male patient.  General Assessment Data Location of Assessment: [facility] ED TTS Assessment: In system Is this a Tele or Face-to-Face Assessment?: Face-to-Face Is this an Initial Assessment or a Re-assessment for this encounter?: Initial Assessment Marital status: Single Maiden name: n/a Is patient pregnant?: No Pregnancy Status: No Living Arrangements: Alone Can pt return to current living arrangement?: Yes Admission Status: Voluntary Is  patient capable of signing voluntary admission?: Yes Referral Source: Self/Family/Friend Insurance type: Designer, industrial/product Exam [facility] Walk-in ONLY) Medical Exam completed: Yes  Crisis Care Plan Living Arrangements: Alone Legal Guardian: Other: (Self) Name of Psychiatrist: None Name of Therapist: None  Education Status Is patient currently in school?: No Current Grade: n/a Highest grade of school patient has completed: 12th Name of school: [employer] person: n/a  Risk to self with the past 6 months Suicidal Ideation: No Has patient been a risk to self within the past 6 months prior to admission? : No Suicidal Intent: No Has patient had any suicidal intent within the past 6 months prior to admission? : No Is patient at risk for suicide?: No Suicidal Plan?: No Has patient had any suicidal plan within the past 6 months prior to admission? : No Access to Means: No What has been your use of drugs/alcohol within the last 12 months?: Denied Previous Attempts/Gestures: No How many times?: 0 Other Self Harm Risks: denied Triggers for Past Attempts: None known Intentional Self Injurious Behavior: None Family Suicide History: No Recent stressful life event(s):  (denied) Persecutory voices/beliefs?: No Depression: Yes Depression Symptoms: Despondent Substance abuse history and/or treatment for substance abuse?: No Suicide prevention information given to non-admitted patients: Not applicable  Risk to Others within the past 6 months Homicidal Ideation: No Does patient have any lifetime risk of violence toward others beyond the six months prior to admission? : No Thoughts of Harm to Others: No  Current Homicidal Intent: No Current Homicidal Plan: No Access to Homicidal Means: No Identified Victim: None identified History of harm to others?: No Assessment of Violence: None Noted Violent Behavior Description: denied Does patient have access to weapons?:  No Criminal Charges Pending?: No Does patient have a court date: No Is patient on probation?: No  Psychosis Hallucinations: Visual Delusions:  (Paranoid)  Mental Status Report Appearance/Hygiene: In scrubs Eye Contact: Poor (would not look up) Motor Activity: Unremarkable Speech: Slow (delay in answering questions) Level of Consciousness: Alert Mood: Depressed Affect: Flat Anxiety Level: Minimal Thought Processes: Coherent Judgement: Partial Orientation: Person, Place, Situation Obsessive Compulsive Thoughts/Behaviors: None  Cognitive Functioning Concentration: Poor Memory: Recent Intact IQ: Average Insight: Fair Impulse Control: Fair Appetite: Fair Sleep: Decreased Vegetative Symptoms: None (denied)  ADLScreening [facility] Assessment Services) Patient's cognitive ability adequate to safely complete daily activities?: Yes Patient able to express need for assistance with ADLs?: Yes Independently performs ADLs?: Yes (appropriate for developmental age)  Prior Inpatient Therapy Prior Inpatient Therapy: Yes Prior Therapy Dates: 2018 and prior Prior Therapy Facilty/Provider([facility],  Reason for Treatment: Personality Disorder  Prior Outpatient Therapy Prior Outpatient Therapy: Yes Prior Therapy Dates: 2013 Prior Therapy Facilty/Provider(s): [facility]  - unsure what agency Reason for Treatment: Schizophrenia Does patient have an ACCT team?: No Does patient have Intensive In-House Services?  : No Does patient have [facility] services? : No Does patient have P4CC services?: No  ADL Screening (condition at time of admission) Patient's cognitive ability adequate to safely complete daily activities?: Yes Is the patient deaf or have difficulty hearing?: No Does the patient have difficulty seeing, even when wearing glasses/contacts?: No Does the patient have difficulty concentrating, remembering, or making decisions?: No Patient able to express need for  assistance with ADLs?: Yes Does the patient have difficulty dressing or bathing?: No Independently performs ADLs?: Yes (appropriate for developmental age) Does the patient have difficulty walking or climbing stairs?: No Weakness of Legs: None Weakness of Arms/Hands: None       Abuse/Neglect Assessment (Assessment to be complete while patient is alone) Physical Abuse: Denies Verbal Abuse: Denies Sexual Abuse: Denies Exploitation of patient/patient's resources: Denies Self-Neglect: Denies     Merchant navy officerAdvance Directives (For Healthcare) Does Patient Have a Medical Advance Directive?: No Would patient like information on creating a medical advance directive?: No - Patient declined    Additional Information 1:1 In Past 12 Months?: No CIRT Risk: No Elopement Risk: No Does patient have medical clearance?: Yes     Disposition:  Disposition Initial Assessment Completed for this Encounter: Yes Disposition of Patient: Other dispositions  On Site Evaluation by:   Reviewed with Physician:    Justice DeedsKeisha Malakhai Beitler 06/25/2017 11:53 PM

## 2017-06-25 NOTE — ED Notes (Signed)
Pt. To BHU from ED ambulatory without difficulty, to room  BHU1. Report from [employer] RN. Pt. Is alert and oriented, warm and dry in no distress. Pt. Denies SI, HI, and AVH. Pt. Calm and cooperative. Pt. Made aware of security cameras and Q15 minute rounds. Pt. Encouraged to let Nursing staff know of any concerns or needs.

## 2017-06-26 ENCOUNTER — Inpatient Hospital Stay
Admission: AD | Admit: 2017-06-26 | Discharge: 2017-07-03 | DRG: 885 | Disposition: A | Discharge status: Home / Self Care | Payer: Medicare Other | Attending: Psychiatry | Admitting: Psychiatry

## 2017-06-26 DIAGNOSIS — G4700 Insomnia, unspecified: Secondary | ICD-10-CM | POA: Diagnosis present

## 2017-06-26 DIAGNOSIS — F29 Unspecified psychosis not due to a substance or known physiological condition: Secondary | ICD-10-CM | POA: Diagnosis present

## 2017-06-26 DIAGNOSIS — F17210 Nicotine dependence, cigarettes, uncomplicated: Secondary | ICD-10-CM | POA: Diagnosis present

## 2017-06-26 DIAGNOSIS — Z79899 Other long term (current) drug therapy: Secondary | ICD-10-CM

## 2017-06-26 DIAGNOSIS — Z9119 Patient's noncompliance with other medical treatment and regimen: Secondary | ICD-10-CM

## 2017-06-26 DIAGNOSIS — F1220 Cannabis dependence, uncomplicated: Secondary | ICD-10-CM | POA: Diagnosis present

## 2017-06-26 DIAGNOSIS — Z818 Family history of other mental and behavioral disorders: Secondary | ICD-10-CM

## 2017-06-26 DIAGNOSIS — F419 Anxiety disorder, unspecified: Secondary | ICD-10-CM | POA: Diagnosis present

## 2017-06-26 DIAGNOSIS — F17200 Nicotine dependence, unspecified, uncomplicated: Secondary | ICD-10-CM | POA: Diagnosis present

## 2017-06-26 DIAGNOSIS — F410 Panic disorder [episodic paroxysmal anxiety] without agoraphobia: Secondary | ICD-10-CM | POA: Diagnosis present

## 2017-06-26 DIAGNOSIS — F203 Undifferentiated schizophrenia: Secondary | ICD-10-CM | POA: Diagnosis present

## 2017-06-26 DIAGNOSIS — Z9114 Patient's other noncompliance with medication regimen: Secondary | ICD-10-CM

## 2017-06-26 DIAGNOSIS — Z91199 Patient's noncompliance with other medical treatment and regimen due to unspecified reason: Secondary | ICD-10-CM

## 2017-06-26 LAB — URINE DRUG SCREEN, QUALITATIVE (ARMC ONLY)
Amphetamines, Ur Screen: NOT DETECTED
Barbiturates, Ur Screen: NOT DETECTED
Benzodiazepine, Ur Scrn: NOT DETECTED
CANNABINOID 50 NG, UR ~~LOC~~: POSITIVE — AB
COCAINE METABOLITE, UR ~~LOC~~: NOT DETECTED
MDMA (ECSTASY) UR SCREEN: NOT DETECTED
METHADONE SCREEN, URINE: NOT DETECTED
Opiate, Ur Screen: NOT DETECTED
Phencyclidine (PCP) Ur S: NOT DETECTED
TRICYCLIC, UR SCREEN: NOT DETECTED

## 2017-06-26 NOTE — ED Notes (Signed)
Patient is resting comfortably. 

## 2017-06-26 NOTE — BH Assessment (Addendum)
[facility] by Dr. Ardyth HarpsHernandez.  Attending Physician will be Dr. Jennet MaduroPucilowska.   Patient [facility] 320, [facility] Gwen.   ER staff is aware of the admission ( LisaER Sect.; Dr. Silverio LayYao, ER MD; Amy Patient's Nurse & Mertie ClauseJeanelle, Patient Access).  Pt may transfer after 1900.

## 2017-06-26 NOTE — ED Notes (Signed)
Patient in bed eating breakfast in no distress. Denies SI, HI, AH. Endorses VH, seeing shadows. Pt with delayed response and does stare off during assessment at times. Denies pain and in no distress

## 2017-06-26 NOTE — BH Assessment (Signed)
Pt chart under review by Dr.Hernandez for possible [facility] BMU admission. 

## 2017-06-26 NOTE — ED Notes (Signed)
PT IVC  GOING  TO  BEH  MED  TONIGHT  PER  INTAKE  NURSE

## 2017-06-26 NOTE — ED Notes (Signed)
Report given to SOC. Camera set up in room. 

## 2017-06-26 NOTE — ED Notes (Signed)
SOC complete.  

## 2017-06-26 NOTE — ED Notes (Signed)
Patient asleep in room. No noted distress or abnormal behavior. Will continue 15 minute checks and observation by security cameras for safety. 

## 2017-06-26 NOTE — ED Notes (Signed)
Pt. Alert and oriented, warm and dry, in no distress. Pt. Denies SI, HI, and AVH. Pt states he is feeling much better. Pt. Encouraged to let nursing staff know of any concerns or needs.

## 2017-06-26 NOTE — ED Provider Notes (Signed)
  Physical Exam  BP 109/74 (BP Location: Right Arm)   Pulse 60   Temp 97.7 F (36.5 C) (Oral)   Resp 16   Wt 90.7 kg (200 lb)   SpO2 99%   BMI 25.68 kg/m   Physical Exam  ED Course  Procedures  MDM Patient here with hallucinations. Psych recommend admission. Medically cleared       Charlynne PanderYao, Vila Dory Hsienta, MD 06/26/17 [phone]

## 2017-06-26 NOTE — ED Notes (Signed)

## 2017-06-26 NOTE — ED Notes (Signed)
Soc complete /pending placement /moved to bhu

## 2017-06-26 NOTE — ED Notes (Signed)
[facility] Patient aware of transfer.

## 2017-06-26 NOTE — ED Notes (Signed)
Pt given dinner tray. Watching TV in room. No needs or concerns at this time. Maintained on 15 minute checks and observation by security camera for safety.

## 2017-06-26 NOTE — Tx Team (Signed)
Initial Treatment Plan 06/26/2017 11:45 PM Edwin Montes [id number]    PATIENT STRESSORS: Medication change or noncompliance   PATIENT STRENGTHS: Average or above average intelligence Capable of independent living Physical Health Special hobby/interest Supportive family/friends   PATIENT IDENTIFIED PROBLEMS: Anxiety   Med non-compliance                   DISCHARGE CRITERIA:  Improved stabilization in mood, thinking, and/or behavior Motivation to continue treatment in a less acute level of care Verbal commitment to aftercare and medication compliance  PRELIMINARY DISCHARGE PLAN: Outpatient therapy Return to previous living arrangement  PATIENT/FAMILY INVOLVEMENT: This treatment plan has been presented to and reviewed with the patient, Edwin NyCameron Gray Hanko.  The patient have been given the opportunity to ask questions and make suggestions.  Chancy HurterNichole  Giannis Corpuz, RN 06/26/2017, 11:45 PM

## 2017-06-27 DIAGNOSIS — F203 Undifferentiated schizophrenia: Principal | ICD-10-CM

## 2017-06-27 LAB — LIPID PANEL
Cholesterol: 183 mg/dL (ref 0–200)
HDL: 47 mg/dL (ref >40)
LDL CALC: 116 mg/dL — AB (ref 0–99)
TRIGLYCERIDES: 99 mg/dL (ref <150)
Total CHOL/HDL Ratio: 3.9 RATIO
VLDL: 20 mg/dL (ref 0–40)

## 2017-06-27 LAB — TSH: TSH: 2.495 u[IU]/mL (ref 0.350–4.500)

## 2017-06-27 MED ORDER — TRAZODONE HCL 50 MG PO TABS: 50.0000 mg | ORAL_TABLET | Freq: Every evening | ORAL | Status: DC | PRN

## 2017-06-27 MED ORDER — ARIPIPRAZOLE 10 MG PO TABS
20.0000 mg | ORAL_TABLET | Freq: Every day | ORAL | Status: DC
  Administered 2017-06-27 – 2017-07-03 (×7): 20 mg via ORAL
  Filled 2017-06-27 (×7): qty 2

## 2017-06-27 MED ORDER — FLUVOXAMINE MALEATE 50 MG PO TABS
50.0000 mg | ORAL_TABLET | Freq: Every day | ORAL | Status: DC
  Administered 2017-06-27 – 2017-07-01 (×5): 50 mg via ORAL
  Filled 2017-06-27 (×5): qty 1

## 2017-06-27 MED ORDER — MAGNESIUM HYDROXIDE 400 MG/5ML PO SUSP: 30.0000 mL | Freq: Every day | ORAL | Status: DC | PRN

## 2017-06-27 MED ORDER — ALUM & MAG HYDROXIDE-SIMETH 200-200-20 MG/5ML PO SUSP: 30.0000 mL | ORAL | Status: DC | PRN

## 2017-06-27 MED ORDER — OLANZAPINE 5 MG PO TBDP
5.0000 mg | ORAL_TABLET | Freq: Every day | ORAL | Status: DC
  Administered 2017-06-27: 5 mg via ORAL
  Filled 2017-06-27: qty 1

## 2017-06-27 MED ORDER — TRAZODONE HCL 100 MG PO TABS
100.0000 mg | ORAL_TABLET | Freq: Every day | ORAL | Status: DC
  Administered 2017-06-27 – 2017-07-02 (×6): 100 mg via ORAL
  Filled 2017-06-27 (×6): qty 1

## 2017-06-27 MED ORDER — ACETAMINOPHEN 325 MG PO TABS: 650.0000 mg | ORAL_TABLET | Freq: Four times a day (QID) | ORAL | Status: DC | PRN

## 2017-06-27 MED ORDER — HYDROXYZINE HCL 25 MG PO TABS
25.0000 mg | ORAL_TABLET | Freq: Three times a day (TID) | ORAL | Status: DC | PRN
  Administered 2017-06-28 – 2017-06-29 (×2): 25 mg via ORAL
  Filled 2017-06-27 (×2): qty 1

## 2017-06-27 MED ORDER — OLANZAPINE 5 MG PO TBDP: 5.0000 mg | ORAL_TABLET | Freq: Three times a day (TID) | ORAL | Status: DC | PRN

## 2017-06-27 NOTE — B[facility] Group Notes (Signed)
[facility] Group Notes:  (Nursing/MHT/Case Management/Adjunct)  Date:  06/27/2017  Time:  10:20 PM  Type of Therapy:  Group Therapy  Participation Level:  Did Not Attend  Participation Qual Summary of Progress/Problems:  Mayra NeerJackie L Latha Staunton 06/27/2017, 10:20 PM

## 2017-06-27 NOTE — Progress Notes (Signed)
[facility] MD Progress Note  06/27/2017 6:55 PM Edwin Montes  MRN:  [id number]  Subjective:  Edwin Montes is a 31 year old mal;e with a history of schizophrenia admitted floridly psychotic in the contaxt of medication noncompliance. Restarted on Abilify, Zyprexa and Luvox.  03/28/2017.  Per nursing:  Principal Problem: Undifferentiated schizophrenia (HCC) Diagnosis:   Patient Active Problem List   Diagnosis Date Noted  . Psychosis [F29] 06/27/2017  . Undifferentiated schizophrenia (HCC) [F20.3] 03/24/2017  . 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 Panic attack [F41.0] 06/23/2016  . Noncompliance [Z91.19] 02/29/2016  . Tobacco use disorder [F17.200] 01/27/2016   Total Time spent with patient: 30 minutes  Past Psychiatric History: Schizophrenia.  Past Medical History:  Past Medical History:  Diagnosis Date  . Anxiety   . Schizophrenia [facility])     Past Surgical History:  Procedure Laterality Date  . NO PAST SURGERIES     Family History: History reviewed. No pertinent family history. Family Psychiatric  History: See H&P. Social History:  History  Alcohol Use No     History  Drug Use  . Types: Other-see comments    Comment: Cannibis    Social History   Social History  . Marital status: Single    Spouse name: N/A  . Number of children: N/A  . Years of education: N/A   Social History Main Topics  . Smoking status: Current Some Day Smoker    Packs/day: 0.50    Years: 1.00    Types: Cigarettes  . Smokeless tobacco: Never Used  . Alcohol use No  . Drug use: Yes    Types: Other-see comments     Comment: Cannibis  . Sexual activity: Not Currently   Other Topics Concern  . None   Social History Narrative  . None   Additional Social History:    Pain Medications:  (none) Prescriptions: see PTA History of alcohol / drug use?: Yes (cannibis use twice a month) Withdrawal Symptoms: (S) Other (Comment) (none)                     Sleep: Fair  Appetite:  Fair  Current Medications: Current Facility-Administered Medications  Medication Dose Route Frequency Provider Last Rate Last Dose  . acetaminophen (TYLENOL) tablet 650 mg  650 mg Oral Q6H PRN Jimmy Footman, MD      . alum & mag hydroxide-simeth (MAALOX/MYLANTA) 200-200-20 MG/5ML suspension 30 mL  30 mL Oral Q4H PRN Jimmy Footman, MD      . ARIPiprazole (ABILIFY) tablet 20 mg  20 mg Oral Daily Sarissa Dern B, MD   20 mg at 06/27/17 1637  . fluvoxaMINE (LUVOX) tablet 50 mg  50 mg Oral QHS Janny Crute B, MD      . hydrOXYzine (ATARAX/VISTARIL) tablet 25 mg  25 mg Oral TID PRN Jimmy Footman, MD      . magnesium hydroxide (MILK OF MAGNESIA) suspension 30 mL  30 mL Oral Daily PRN Jimmy Footman, MD      . OLANZapine zydis (ZYPREXA) disintegrating tablet 5 mg  5 mg Oral QHS Axyl Sitzman B, MD      . traZODone (DESYREL) tablet 100 mg  100 mg Oral QHS Reghan Thul B, MD        Lab Results:  Results for orders placed or performed during the hospital encounter of 06/26/17 (from the past 48 hour(s))  Lipid panel     Status: Abnormal   Collection Time: 06/27/17  6:49 AM  Result Value Ref Range   Cholesterol 183 0 - 200 mg/dL   Triglycerides 99 <295<150 mg/dL   HDL 47 >28>40 mg/dL   Total CHOL/HDL Ratio 3.9 RATIO   VLDL 20 0 - 40 mg/dL   LDL Cholesterol 413116 (H) 0 - 99 mg/dL    Comment:        Total Cholesterol/HDL:CHD Risk Coronary Heart Disease Risk Table                     Men   Women  1/2 Average Risk   3.4   3.3  Average Risk       5.0   4.4  2 X Average Risk   9.6   7.1  3 X Average Risk  23.4   11.0        Use the calculated Patient Ratio above and the CHD Risk Table to determine the patient's CHD Risk.        ATP III CLASSIFICATION (LDL):  <100     mg/dL   Optimal  244-010100-129  mg/dL   Near or Above                    Optimal  130-159  mg/dL   Borderline  272-536160-189  mg/dL   High   >644>190     mg/dL   Very High   TSH     Status: None   Collection Time: 06/27/17  6:49 AM  Result Value Ref Range   TSH 2.495 0.350 - 4.500 uIU/mL    Comment: Performed by a 3rd Generation assay with a functional sensitivity of <=0.01 uIU/mL.    Blood Alcohol level:  Lab Results  Component Value Date   ETH <5 06/25/2017   ETH <5 03/23/2017    Metabolic Disorder Labs: Lab Results  Component Value Date   HGBA1C 5.2 03/26/2017   MPG 103 03/26/2017   MPG 103 11/03/2016   Lab Results  Component Value Date   PROLACTIN 1.7 (L) 01/27/2016   Lab Results  Component Value Date   CHOL 183 06/27/2017   TRIG 99 06/27/2017   HDL 47 06/27/2017   CHOLHDL 3.9 06/27/2017   VLDL 20 06/27/2017   LDLCALC 116 (H) 06/27/2017   LDLCALC 128 (H) 03/26/2017    Physical Findings: AIMS: Facial and Oral Movements Muscles of Facial Expression: None, normal Lips and Perioral Area: None, normal Jaw: None, normal Tongue: None, normal,Extremity Movements Upper (arms, wrists, hands, fingers): None, normal Lower (legs, knees, ankles, toes): None, normal, Trunk Movements Neck, shoulders, hips: None, normal, Overall Severity Severity of abnormal movements (highest score from questions above): None, normal Incapacitation due to abnormal movements: None, normal Patient's awareness of abnormal movements (rate only patient's report): No Awareness, Dental Status Current problems with teeth and/or dentures?: No Does patient usually wear dentures?: No  CIWA:  CIWA-Ar Total: 0 COWS:  COWS Total Score: 0  Musculoskeletal: Strength & Muscle Tone: within normal limits Gait & Station: normal Patient leans: N/A  Psychiatric Specialty Exam: Physical Exam  Nursing note and vitals reviewed. Psychiatric: His affect is blunt and inappropriate. His speech is delayed. He is slowed, withdrawn and actively hallucinating. Thought content is paranoid. Cognition and memory are impaired. He expresses inappropriate  judgment.    Review of Systems  Psychiatric/Behavioral: Positive for hallucinations. The patient has insomnia.   All other systems reviewed and are negative.   Blood pressure 126/73, pulse 70, temperature 98 F (36.7 C), temperature source Oral, resp.  rate 18, height 6\' 2"  (1.88 m), weight 94.3 kg (208 lb), SpO2 100 %.Body mass index is 26.71 kg/m.  General Appearance: Casual  Eye Contact:  Fair  Speech:  Slow and latency and poverty of speech.  Volume:  Decreased  Mood:  Depressed  Affect:  Blunt  Thought Process:  Disorganized and Descriptions of Associations: Loose  Orientation:  Full (Time, Place, and Person)  Thought Content:  Delusions, Hallucinations: Auditory, Paranoid Ideation and Rumination  Suicidal Thoughts:  No  Homicidal Thoughts:  No  Memory:  Immediate;   Fair Recent;   Fair Remote;   Fair  Judgement:  Poor  Insight:  Lacking  Psychomotor Activity:  Psychomotor Retardation  Concentration:  Concentration: Poor and Attention Span: Poor  Recall:  Poor  Fund of Knowledge:  Fair  Language:  Fair  Akathisia:  No  Handed:  Right  AIMS (if indicated):     Assets:  Communication Skills Desire for Improvement Financial Resources/Insurance Housing Physical Health Resilience Social Support  ADL's:  Intact  Cognition:  WNL  Sleep:  Number of Hours: 4.5     Treatment Plan Summary: Daily contact with patient to assess and evaluate symptoms and progress in treatment and Medication management   Edwin Montes is a 31 year old male with schizophrenia admitted for anxiety and hallucinations due to medication noncompliance.   1. Schizophrenia. Continue Abilify 20 mg a day along with Abilify injectable 400 mg every 30 days.  2. Anxiety. We restarted Luvox.   3. Insomnia. Trazodone was available.   4. Cannabis use disorder. Patient has received education about the negative effects of cannabis in schizophrenia  5. Tobacco use disorder. Nicotine patch was  available.   6. Metabolic syndrome monitoring. Hemoglobin A1c, lipid panel and TSH are normal.   7. EKG. Normal sinus rhythm. QTc 394.  8. Disposition. He will be discharged back to his apartment. He will follow up with [facility] psychiatrist, peer support and day program.  Kristine Linea, MD 06/27/2017, 6:55 PM

## 2017-06-27 NOTE — H&P (Signed)
Psychiatric Admission Assessment Adult  Patient Identification: Saqib Cazarez MRN:  [id number] Date of Evaluation:  06/27/2017 Chief Complaint:  schizophrenk Principal Diagnosis: Undifferentiated schizophrenia (HCC) Diagnosis:   Patient Active Problem List   Diagnosis Date Noted  . Psychosis [F29] 06/27/2017  . Undifferentiated schizophrenia (HCC) [F20.3] 03/24/2017  . 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 Panic attack [F41.0] 06/23/2016  . Noncompliance [Z91.19] 02/29/2016  . Tobacco use disorder [F17.200] 01/27/2016   History of Present Illness:   Identifying data. Mr. [name] is a 31 year old male with history of schizophrenia.  Chief complaint. "I have racing thoughts again."  History of present illness. Information was obtained from the patient and the chart. The patient was brought to the Emergency Room by EMS complaining of anxiety, shortness of breath, racing thoughts and paranoia. He has been off his medications for several days. It is unclear if he continues on Abilify maintena injections as planned. He as a long history of schizophrenia diagnosed about 10 years ago. He has been doing exceedingly well on Abilify maintena injections and graduated from the group home into his own supervised apartment almost one year ago. He sees Dr. Georjean Mode at [facility] regularly and works with peer support team. For several days now, the patient has been feeling increasingly anxious, with racing disorganized thoughts and insomnia. He denies suicidal ideation, intentions or plans but feels confused and uncertain. He also reports auditory hallucinations but no commands. He appears to respond to internal stimuli during the interview and treatment team meeting. His latency of response is striking, takes several minutes to answer the question correctly. Following our interview, he returned to my office several times asking about discharge but was ok to stay till next week. He  does not reports paranoia and heightened anxiety but is not precise. In the past, he complained of symptoms of generalized anxiety disorder, infrequent panic attacks and some symptoms suggestive of OCD with excessive worries, excessive cleaning and organizing. He was positive for cannabis on admission.   Past psychiatric history. The patient is not a good historian. Apparently he was diagnosed at the age of 43 and was placed in a group home at that time. He has been a resident of multiple group homes up until recently when he "graduated" and was allowed to live in his own apartment. He reports being hospitalized many times in the past. He denies ever attempting suicide. He believes that he was given diagnosis of bipolar, schizoaffective disorder, schizophrenia, and anxiety. He has been tried on multiple medications including Risperdal, Invega, Zyprexa, Latuda, Abilify and Saphris. He does not remember ever being on Seroquel or Geodon. He was tried on Depakote but could not tolerate it He has never taken lithium or Tegretol. In the past he was treated with SSRIs for anxiety. He was given Klonopin in the past.  Family psychiatric history. Grandmother with depression.  Social history. He grew up in [place], [place]. He graduated from high school. He denies any history of abuse. He has been a resident of multiple group homes. He is now living in a supervised apartment in [place]. He sees Dr. Georjean Mode at [facility] for mental health and Dr. Lacie Scotts as his primary doctor. He goes to a day program at the [facility] and works with peer support team.  Total Time spent with patient: 1 hour  Is the patient at risk to self? No.  Has the patient been a risk to self in the past 6 months? No.  Has  the patient been a risk to self within the distant past? No.  Is the patient a risk to others? No.  Has the patient been a risk to others in the past 6 months? No.  Has the patient been a risk to others within the  distant past? No.   Prior Inpatient Therapy:   Prior Outpatient Therapy:    Alcohol Screening: Patient refused Alcohol Screening Tool:  (N/A) 1. How often do you have a drink containing alcohol?: Never 9. Have you or someone else been injured as a result of your drinking?: No 10. Has a relative or friend or a doctor or another health worker been concerned about your drinking or suggested you cut down?: No Alcohol Use Disorder Identification Test Final Score (AUDIT): 0 Brief Intervention: AUDIT score less than 7 or less-screening does not suggest unhealthy drinking-brief intervention not indicated Substance Abuse History in the last 12 months:  Yes.   Consequences of Substance Abuse: Negative Previous Psychotropic Medications: Yes.  Psychological Evaluations: No  Past Medical History:  Past Medical History:  Diagnosis Date  . Anxiety   . Schizophrenia [facility](HCC)     Past Surgical History:  Procedure Laterality Date  . NO PAST SURGERIES     Family History: History reviewed. No pertinent family history.  Tobacco Screening: Have you used any form of tobacco in the last 30 days? (Cigarettes, Smokeless Tobacco, Cigars, and/or Pipes): Yes Tobacco use, Select all that apply: 4 or less cigarettes per day Are you interested in Tobacco Cessation Medications?: Yes, will notify MD for an order Counseled patient on smoking cessation including recognizing danger situations, developing coping skills and basic information about quitting provided: Yes Social History:  History  Alcohol Use No     History  Drug Use  . Types: Other-see comments    Comment: Cannibis    Additional Social History: Marital status: Single Are you sexually active?: No What is your sexual orientation?: Heterosexual  Has your sexual activity been affected by drugs, alcohol, medication, or emotional stress?: na Does patient have children?: No    Pain Medications:  (none) Prescriptions: see PTA History of alcohol /  drug use?: Yes (cannibis use twice a month) Withdrawal Symptoms: (S) Other (Comment) (none)                    Allergies:  No Known Allergies Lab Results:  Results for orders placed or performed during the hospital encounter of 06/26/17 (from the past 48 hour(s))  Lipid panel     Status: Abnormal   Collection Time: 06/27/17  6:49 AM  Result Value Ref Range   Cholesterol 183 0 - 200 mg/dL   Triglycerides 99 <409<150 mg/dL   HDL 47 >81>40 mg/dL   Total CHOL/HDL Ratio 3.9 RATIO   VLDL 20 0 - 40 mg/dL   LDL Cholesterol 191116 (H) 0 - 99 mg/dL    Comment:        Total Cholesterol/HDL:CHD Risk Coronary Heart Disease Risk Table                     Men   Women  1/2 Average Risk   3.4   3.3  Average Risk       5.0   4.4  2 X Average Risk   9.6   7.1  3 X Average Risk  23.4   11.0        Use the calculated Patient Ratio above and the CHD Risk Table to  determine the patient's CHD Risk.        ATP III CLASSIFICATION (LDL):  <100     mg/dL   Optimal  409-811  mg/dL   Near or Above                    Optimal  130-159  mg/dL   Borderline  914-782  mg/dL   High  >956     mg/dL   Very High   TSH     Status: None   Collection Time: 06/27/17  6:49 AM  Result Value Ref Range   TSH 2.495 0.350 - 4.500 uIU/mL    Comment: Performed by a 3rd Generation assay with a functional sensitivity of <=0.01 uIU/mL.    Blood Alcohol level:  Lab Results  Component Value Date   ETH <5 06/25/2017   ETH <5 03/23/2017    Metabolic Disorder Labs:  Lab Results  Component Value Date   HGBA1C 5.2 03/26/2017   MPG 103 03/26/2017   MPG 103 11/03/2016   Lab Results  Component Value Date   PROLACTIN 1.7 (L) 01/27/2016   Lab Results  Component Value Date   CHOL 183 06/27/2017   TRIG 99 06/27/2017   HDL 47 06/27/2017   CHOLHDL 3.9 06/27/2017   VLDL 20 06/27/2017   LDLCALC 116 (H) 06/27/2017   LDLCALC 128 (H) 03/26/2017    Current Medications: Current Facility-Administered Medications   Medication Dose Route Frequency Provider Last Rate Last Dose  . acetaminophen (TYLENOL) tablet 650 mg  650 mg Oral Q6H PRN Jimmy Footman, MD      . alum & mag hydroxide-simeth (MAALOX/MYLANTA) 200-200-20 MG/5ML suspension 30 mL  30 mL Oral Q4H PRN Jimmy Footman, MD      . ARIPiprazole (ABILIFY) tablet 20 mg  20 mg Oral Daily Lashona Schaaf B, MD      . fluvoxaMINE (LUVOX) tablet 50 mg  50 mg Oral QHS Gilberto Streck B, MD      . hydrOXYzine (ATARAX/VISTARIL) tablet 25 mg  25 mg Oral TID PRN Jimmy Footman, MD      . magnesium hydroxide (MILK OF MAGNESIA) suspension 30 mL  30 mL Oral Daily PRN Jimmy Footman, MD      . OLANZapine zydis (ZYPREXA) disintegrating tablet 5 mg  5 mg Oral TID PRN Jimmy Footman, MD      . traZODone (DESYREL) tablet 100 mg  100 mg Oral QHS Bobbyjo Marulanda B, MD       PTA Medications: Prescriptions Prior to Admission  Medication Sig Dispense Refill Last Dose  . ARIPiprazole (ABILIFY) 20 MG tablet Take 1 tablet (20 mg total) by mouth daily. 30 tablet 1 06/18/2017  . ARIPiprazole ER 400 MG SUSR Inject 400 mg into the muscle every 28 (twenty-eight) days. Due on January 19 1 each 1 05/30/2017  . fluvoxaMINE (LUVOX) 100 MG tablet Take 1 tablet (100 mg total) by mouth at bedtime. 30 tablet 1 06/18/2017    Musculoskeletal: Strength & Muscle Tone: within normal limits Gait & Station: normal Patient leans: N/A  Psychiatric Specialty Exam: Physical Exam  Constitutional: He is oriented to person, place, and time. He appears well-developed and well-nourished.  HENT:  Head: Normocephalic and atraumatic.  Eyes: Pupils are equal, round, and reactive to light. Conjunctivae and EOM are normal.  Neck: Normal range of motion. Neck supple.  Cardiovascular: Normal rate, regular rhythm and normal heart sounds.   Respiratory: Effort normal and breath sounds normal.  GI: Soft. Bowel sounds  are normal.   Musculoskeletal: Normal range of motion.  Neurological: He is alert and oriented to person, place, and time. He has normal reflexes.  Skin: Skin is warm and dry.  Psychiatric: His mood appears anxious. His affect is blunt. His speech is delayed. He is slowed, withdrawn and actively hallucinating. Thought content is paranoid. Cognition and memory are impaired. He expresses inappropriate judgment.    Review of Systems  Psychiatric/Behavioral: Positive for hallucinations and substance abuse. The patient is nervous/anxious and has insomnia.   All other systems reviewed and are negative.   Blood pressure 126/73, pulse 70, temperature 98 F (36.7 C), temperature source Oral, resp. rate 18, height 6\' 2"  (1.88 m), weight 94.3 kg (208 lb), SpO2 100 %.Body mass index is 26.71 kg/m.  See SRA.                                                  Sleep:  Number of Hours: 4.5    Treatment Plan Summary: Daily contact with patient to assess and evaluate symptoms and progress in treatment and Medication management   Mr. Grand is a 31 year old male with schizophrenia admitted for anxiety and hallucinations due to medication noncompliance.   1. Schizophrenia. Continue Abilify 20 mg a day along with Abilify injectable 400 mg every 30 days.  2. Anxiety. We restarted Luvox.    3. Insomnia. Trazodone was available.   4. Cannabis use disorder. Patient has received education about the negative effects of cannabis in schizophrenia  5. Tobacco use disorder. Nicotine patch was available.   6. Metabolic syndrome monitoring. Hemoglobin A1c, lipid panel and TSH are normal.   7. EKG. Normal sinus rhythm. QTc 394.  8. Disposition. He will be discharged back to his apartment. He will follow up with [facility] psychiatrist, peer support and day program.   Observation Level/Precautions:  15 minute checks  Laboratory:  CBC Chemistry Profile UDS UA  Psychotherapy:    Medications:     Consultations:    Discharge Concerns:    Estimated LOS:  Other:     Physician Treatment Plan for Primary Diagnosis: Undifferentiated schizophrenia (HCC) Long Term Goal(s): Improvement in symptoms so as ready for discharge  Short Term Goals: Ability to identify changes in lifestyle to reduce recurrence of condition will improve, Ability to verbalize feelings will improve, Ability to disclose and discuss suicidal ideas, Ability to demonstrate self-control will improve, Ability to identify and develop effective coping behaviors will improve, Ability to maintain clinical measurements within normal limits will improve, Compliance with prescribed medications will improve and Ability to identify triggers associated with substance abuse/mental health issues will improve  Physician Treatment Plan for Secondary Diagnosis: Principal Problem:   Undifferentiated schizophrenia (HCC) Active Problems:   Tobacco use disorder   Noncompliance   Cannabis use disorder, moderate, dependence (HCC)   Psychosis  Long Term Goal(s): Improvement in symptoms so as ready for discharge  Short Term Goals: Ability to identify changes in lifestyle to reduce recurrence of condition will improve, Ability to disclose and discuss suicidal ideas, Compliance with prescribed medications will improve and Ability to identify triggers associated with substance abuse/mental health issues will improve  I certify that inpatient services furnished can reasonably be expected to improve the patient's condition.    Kristine Linea, MD 8/15/20184:00 PM

## 2017-06-27 NOTE — Progress Notes (Signed)
Recreation Therapy Notes  INPATIENT RECREATION THERAPY ASSESSMENT  Patient Details Name: Jorge NyCameron Gray Rhine MRN: [id number] DOB: 01-01-1986 Today's Date: 06/27/2017  Patient Stressors:  Patient reported no stressors.  Coping Skills:   Isolate, Avoidance, Exercise, Talking, Music, Sports, Other (Comment) (Writing)  Personal Challenges: Anger, Communication, Concentration, Decision-Making, Expressing Yourself, Problem-Solving, Relationships, Self-Esteem/Confidence, Social Interaction, Time Management, Trusting Others  Leisure Interests (2+):  Sports - Basketball, Individual - Other (Comment) (Exercise)  Awareness of Community Resources:  Yes  Community Resources[place]  Current Use: No  If no, Barriers?: Other (Comment) (Pt does not know)  Patient Strengths:  Nice person  Patient Identified Areas of Improvement:  Self-esteem  Current Recreation Participation:  Going to the day program  Patient Goal for Hospitalization:  Stay focused  [place]  County of Residence:  [place]   Current SI (including self-harm):  No  Current HI:  No  Consent to Intern Participation: N/A   Jacquelynn CreeGreene,Nazir Hacker M, LRT/CTRS 06/27/2017, 4:01 PM

## 2017-06-27 NOTE — Progress Notes (Signed)
Keys Wallet Various cards ID Debit card

## 2017-06-27 NOTE — Progress Notes (Signed)
D- Edwin RidgelCameron Montes 31 y.o male [facility], with c/o paranoia, anxiety and racing thoughts. Pt dx: Anxiety and Schizophrenia. Pt has been admitted twice in the last 12 months. Pt has been non-compliant with meds, pt was taking Luvox 15 mg and Abilify but discontinued them 9 days ago due to making him feel drowsy.. Denies SI, HI or A/V hallucinations.   A- Skin assessed with 2nd nurse (ABI), skin is intact no open areas, pt does have a sun burn on the back of neck with skin peeling. Pt searched for contraband, no contraband found. POC and unit policies explained. Pt verbalized understanding. Consent obtained. Verbal contract not to harm self given by pt. Food and fluids offered and given to pt.   R- Pt encouraged to contact staff or any of his needs, questions or concerns.

## 2017-06-27 NOTE — B[facility] Counselor (Signed)
Adult Comprehensive Assessment  Patient ID: Edwin Montes, male   DOB: [birth year], 31 y.o.   MRN: [id number]  Information Source: Information source: Patient  Current Stressors:  Social relationships: Pt reports he goes to a day program, which can be stressful at times.  Living/Environment/Situation:  Living Arrangements: Alone Living conditions (as described by patient or guardian): goes well How long has patient lived in current situation?: 18 months What is atmosphere in current home: Comfortable  Family History:  Marital status: Single Are you sexually active?: No What is your sexual orientation?: Heterosexual  Has your sexual activity been affected by drugs, alcohol, medication, or emotional stress?: na Does patient have children?: No  Childhood History:  By whom was/is the patient raised?: Both parents Additional childhood history information: Pt offered few details.  Good childhood. Description of patient's relationship with caregiver when they were a child: PT reports good relationship with both parents. Patient's description of current relationship with people who raised him/her: Pt reports he gets along with both parents currently as well. How were you disciplined when you got in trouble as a child/adolescent?: Verbal discipline, appropriate. Does patient have siblings?: Yes Number of Siblings: 1 Description of patient's current relationship with siblings: one sister, good relationship Did patient suffer any verbal/emotional/physical/sexual abuse as a child?: No Did patient suffer from severe childhood neglect?: No Has patient ever been sexually abused/assaulted/raped as an adolescent or adult?: No Was the patient ever a victim of a crime or a disaster?: No Witnessed domestic violence?: No Has patient been effected by domestic violence as an adult?: No  Education:  Highest grade of school patient has completed: HS diploma Currently a Consulting civil engineerstudent?: No Learning  disability?: No  Employment/Work Situation:   Employment situation: On disability Why is patient on disability: Mental health  How long has patient been on disability: 3.5 years What is the longest time patient has a held a job?: 3 years  Where was the patient employed at that time?: [employer]  Has patient ever been in the Eli Lilly and Companymilitary?: No Are There Guns or Other Weapons in Your Home?: No  Financial Resources:   Surveyor, quantityinancial resources: [employer]ceives SSI, Medicaid, Medicare Does patient have a Lawyerrepresentative payee or guardian?: Yes Name of representative payee or guardian: mom is payee  Alcohol/Substance Abuse:   What has been your use of drugs/alcohol within the last 12 months?: Marijuana: 1-2x per month, 1 joint.  Denies alcohol or other drugs If attempted suicide, did drugs/alcohol play a role in this?:  (na) Alcohol/Substance Abuse Treatment Hx: Denies past history Has alcohol/substance abuse ever caused legal problems?: Yes (possession charge, age 31)  Social Support System:   Patient's Community Support System: [employer] Museum/gallery exhibitions officerDescribe Community Support System: mom Type of faith/religion: none How does patient's faith help to cope with current illness?: na  Leisure/Recreation:   Leisure and Hobbies: Sports, playing music   Strengths/Needs:   What things does the patient do well?: day program, independant living In what areas does patient struggle / problems for patient: nothing  Discharge Plan:   Does patient have access to transportation?: Yes Will patient be returning to same living situation after discharge?: Yes Currently receiving community mental health services: Yes (From Whom) ([facility]/Dr Merchant navy officerLitz and peer support) Does patient have financial barriers related to discharge medications?: No  Summary/Recommendations:   Summary and Recommendations (to be completed by the evaluator): Pt is 31 year old male from [place].  Pt is diagnosed with schizophrenia and was admitted due to increased  psychosis.  Recommendations for pt include crisis stabilization, therapeutic milieu, attend and participate in groups, medication management, and development of comprehensive mental wellness plan.  Upon discharge pt will return to outpt services at [facility].  Lorri Frederick. 06/27/2017

## 2017-06-27 NOTE — Progress Notes (Signed)
Recreation Therapy Notes  Date: 08.15.18 Time: 9:30 am Location: Craft Room  Group Topic: Self-esteem  Goal Area(s) Addresses:  Patient will write at least one positive trait about self. Patient will verbalize benefit of having a healthy self-esteem.  Behavioral Response: Inattentive, Left early  Intervention: I Am  Activity: Patients were given worksheets with the letter I on it and were instructed to write as many positive traits about themselves inside the letter.  Education: LRT educated patients on ways they can increase their self-esteem.  Education Outcome: Patient left before LRT educated group.  Clinical Observations/Feedback: Patient appeared to be responding to internal stimuli as he was seen smiling inappropriately before group. Patient did not write anything on worksheet. Patient did not contribute to group discussion. Patient left group at approximately 9:49 am with Child psychotherapistsocial worker. Patient did not return to group.  Jacquelynn CreeGreene,Kalima Saylor M, LRT/CTRS 06/27/2017 10:03 AM

## 2017-06-27 NOTE — Tx Team (Addendum)
Interdisciplinary Treatment and Diagnostic Plan Update  07/03/2017 Time of Session: [place] MRN: [id number]  Principal Diagnosis: Undifferentiated schizophrenia [facility])  Secondary Diagnoses: Principal Problem:   Undifferentiated schizophrenia ([place]) Active Problems:   Tobacco use disorder   Noncompliance   Cannabis use disorder, moderate, dependence ([place])   Current Medications:  Current Facility-Administered Medications  Medication Dose Route Frequency Provider Last Rate Last Dose  . acetaminophen (TYLENOL) tablet 650 mg  650 mg Oral Q6H PRN Hildred Priest, MD      . alum & mag hydroxide-simeth (MAALOX/MYLANTA) 200-200-20 MG/5ML suspension 30 mL  30 mL Oral Q4H PRN Hildred Priest, MD      . ARIPiprazole (ABILIFY) tablet 20 mg  20 mg Oral Daily Pucilowska, Jolanta B, MD   20 mg at 07/03/17 0845  . ARIPiprazole ER SRER 400 mg  400 mg Intramuscular Q28 days Pucilowska, Jolanta B, MD   400 mg at 07/03/17 1122  . fluvoxaMINE (LUVOX) tablet 100 mg  100 mg Oral QHS Pucilowska, Jolanta B, MD   100 mg at 07/02/17 2108  . haloperidol (HALDOL) tablet 10 mg  10 mg Oral QHS Pucilowska, Jolanta B, MD   10 mg at 07/02/17 2108  . haloperidol decanoate (HALDOL DECANOATE) 100 MG/ML injection 100 mg  100 mg Intramuscular Q28 days Pucilowska, Jolanta B, MD   100 mg at 06/30/17 7829  . hydrOXYzine (ATARAX/VISTARIL) tablet 25 mg  25 mg Oral TID PRN Hildred Priest, MD   25 mg at 06/29/17 2145  . magnesium hydroxide (MILK OF MAGNESIA) suspension 30 mL  30 mL Oral Daily PRN Hildred Priest, MD      . traZODone (DESYREL) tablet 100 mg  100 mg Oral QHS Pucilowska, Jolanta B, MD   100 mg at 07/02/17 2109   Current Outpatient Prescriptions  Medication Sig Dispense Refill  . ARIPiprazole (ABILIFY) 20 MG tablet Take 1 tablet (20 mg total) by mouth daily. 30 tablet 1  . ARIPiprazole ER 400 MG SUSR Inject 400 mg into the muscle every 28 (twenty-eight) days.  Due on September 18. 1 each 1  . fluvoxaMINE (LUVOX) 100 MG tablet Take 1 tablet (100 mg total) by mouth at bedtime. 30 tablet 1  . haloperidol (HALDOL) 10 MG tablet Take 1 tablet (10 mg total) by mouth at bedtime. 30 tablet 1  . [START ON 07/27/2017] haloperidol decanoate (HALDOL DECANOATE) 100 MG/ML injection Inject 1 mL (100 mg total) into the muscle every 28 (twenty-eight) days. Next dose on September 18. 1 mL 1  . traZODone (DESYREL) 100 MG tablet Take 1 tablet (100 mg total) by mouth at bedtime. 30 tablet 1   PTA Medications: No prescriptions prior to admission.    Patient Stressors: Medication change or noncompliance  Patient Strengths: Average or above average intelligence Capable of independent living Physical Health Special hobby/interest Supportive family/friends  Treatment Modalities: Medication Management, Group therapy, Case management,  1 to 1 session with clinician, Psychoeducation, Recreational therapy.   Physician Treatment Plan for Primary Diagnosis: Undifferentiated schizophrenia ([place]) Long Term Goal(s): Improvement in symptoms so as ready for discharge Improvement in symptoms so as ready for discharge   Short Term Goals: Ability to identify changes in lifestyle to reduce recurrence of condition will improve Ability to verbalize feelings will improve Ability to disclose and discuss suicidal ideas Ability to demonstrate self-control will improve Ability to identify and develop effective coping behaviors will improve Ability to maintain clinical measurements within normal limits will improve Compliance with prescribed medications will improve Ability  to identify triggers associated with substance abuse/mental health issues will improve Ability to identify changes in lifestyle to reduce recurrence of condition will improve Ability to disclose and discuss suicidal ideas Compliance with prescribed medications will improve Ability to identify triggers associated with  substance abuse/mental health issues will improve  Medication Management: Evaluate patient's response, side effects, and tolerance of medication regimen.  Therapeutic Interventions: 1 to 1 sessions, Unit Group sessions and Medication administration.  Evaluation of Outcomes: Not Met  Physician Treatment Plan for Secondary Diagnosis: Principal Problem:   Undifferentiated schizophrenia ([place]) Active Problems:   Tobacco use disorder   Noncompliance   Cannabis use disorder, moderate, dependence ([place])  Long Term Goal(s): Improvement in symptoms so as ready for discharge Improvement in symptoms so as ready for discharge   Short Term Goals: Ability to identify changes in lifestyle to reduce recurrence of condition will improve Ability to verbalize feelings will improve Ability to disclose and discuss suicidal ideas Ability to demonstrate self-control will improve Ability to identify and develop effective coping behaviors will improve Ability to maintain clinical measurements within normal limits will improve Compliance with prescribed medications will improve Ability to identify triggers associated with substance abuse/mental health issues will improve Ability to identify changes in lifestyle to reduce recurrence of condition will improve Ability to disclose and discuss suicidal ideas Compliance with prescribed medications will improve Ability to identify triggers associated with substance abuse/mental health issues will improve     Medication Management: Evaluate patient's response, side effects, and tolerance of medication regimen.  Therapeutic Interventions: 1 to 1 sessions, Unit Group sessions and Medication administration.  Evaluation of Outcomes: Not Met   RN Treatment Plan for Primary Diagnosis: Undifferentiated schizophrenia ([place]) Long Term Goal(s): Knowledge of disease and therapeutic regimen to maintain health will improve  Short Term Goals: Ability to identify and develop  effective coping behaviors will improve and Compliance with prescribed medications will improve  Medication Management: RN will administer medications as ordered by provider, will assess and evaluate patient's response and provide education to patient for prescribed medication. RN will report any adverse and/or side effects to prescribing provider.  Therapeutic Interventions: 1 on 1 counseling sessions, Psychoeducation, Medication administration, Evaluate responses to treatment, Monitor vital signs and CBGs as ordered, Perform/monitor CIWA, COWS, AIMS and Fall Risk screenings as ordered, Perform wound care treatments as ordered.  Evaluation of Outcomes: Not Met   LCSW Treatment Plan for Primary Diagnosis: Undifferentiated schizophrenia ([place]) Long Term Goal(s): Safe transition to appropriate next level of care at discharge, Engage patient in therapeutic group addressing interpersonal concerns.  Short Term Goals: Engage patient in aftercare planning with referrals and resources and Increase skills for wellness and recovery  Therapeutic Interventions: Assess for all discharge needs, 1 to 1 time with Social worker, Explore available resources and support systems, Assess for adequacy in community support network, Educate family and significant other(s) on suicide prevention, Complete Psychosocial Assessment, Interpersonal group therapy.  Evaluation of Outcomes: Not Met   Progress in Treatment: Attending groups: No. Participating in groups: No. Taking medication as prescribed: Yes. Toleration medication: Yes. Family/Significant other contact made: No, will contact:  mother Patient understands diagnosis: Yes. Discussing patient identified problems/goals with staff: Yes. Medical problems stabilized or resolved: Yes. Denies suicidal/homicidal ideation: Yes. Issues/concerns per patient self-inventory: No. Other: none  New problem(s) identified: No, Describe:  none  New Short Term/Long Term  Goal(s):Pt goal: Pt unable to verbalize a goal.  Discharge Plan or Barriers: Pt will [facility] services at [facility].  Reason for Continuation of Hospitalization: Hallucinations Medication stabilization  Estimated Length of Stay:5-7 days   Attendees: Patient: Edwin Montes 06/27/2017   Physician: Dr. Orson Slick, MD 06/27/2017   Nursing: Polly Cobia, RN 06/27/2017   RN Care Manager: 06/27/2017   Social Worker: Lurline Idol MSW, LCSW 06/27/2017   Recreational Therapist: Leonette Monarch, LRT/CTRS 06/27/2017   Other: Drue Dun, chaplain 06/27/2017   Other:  06/27/2017   Other: 06/27/2017     Scribe for Treatment Team: Beverely Pace, LCSW 07/03/2017 4:25 PM

## 2017-06-27 NOTE — [facility] Suicide Risk Assessment (Signed)
[facility] Admission Suicide Risk Assessment   Nursing information obtained from:    Demographic factors:    Current Mental Status:    Loss Factors:    Historical Factors:    Risk Reduction Factors:     Total Time spent with patient: 1 hour Principal Problem: Undifferentiated schizophrenia (HCC) Diagnosis:   Patient Active Problem List   Diagnosis Date Noted  . Psychosis [F29] 06/27/2017  . Undifferentiated schizophrenia (HCC) [F20.3] 03/24/2017  . 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 Panic attack [F41.0] 06/23/2016  . Noncompliance [Z91.19] 02/29/2016  . Tobacco use disorder [F17.200] 01/27/2016   Subjective Data: psychotic break.  Continued Clinical Symptoms:  Alcohol Use Disorder Identification Test Final Score (AUDIT): 0 The "Alcohol Use Disorders Identification Test", Guidelines for Use in Primary Care, Second Edition.  World Science writer [facility]). Score between 0-7:  no or low risk or alcohol related problems. Score between 8-15:  moderate risk of alcohol related problems. Score between 16-19:  high risk of alcohol related problems. Score 20 or above:  warrants further diagnostic evaluation for alcohol dependence and treatment.   CLINICAL FACTORS:   Alcohol/Substance Abuse/Dependencies Schizophrenia:   Less than 35 years old Paranoid or undifferentiated type   Musculoskeletal: Strength & Muscle Tone: within normal limits Gait & Station: normal Patient leans: N/A  Psychiatric Specialty Exam: Physical Exam  Nursing note and vitals reviewed. Psychiatric: His mood appears anxious. His affect is blunt. His speech is delayed. He is slowed, withdrawn and actively hallucinating. Cognition and memory are impaired. He expresses impulsivity.    Review of Systems  Psychiatric/Behavioral: Positive for hallucinations and substance abuse.  All other systems reviewed and are negative.   Blood pressure 126/73, pulse 70, temperature 98 F  (36.7 C), temperature source Oral, resp. rate 18, height 6\' 2"  (1.88 m), weight 94.3 kg (208 lb), SpO2 100 %.Body mass index is 26.71 kg/m.  General Appearance: Casual  Eye Contact:  Poor  Speech:  latency of speech   Volume:  Normal  Mood:  Anxious  Affect:  Blunt  Thought Process:  Disorganized and Descriptions of Associations: Loose  Orientation:  Full (Time, Place, and Person)  Thought Content:  Hallucinations: Auditory  Suicidal Thoughts:  No  Homicidal Thoughts:  No  Memory:  Immediate;   Fair Recent;   Fair Remote;   Fair  Judgement:  Poor  Insight:  Lacking  Psychomotor Activity:  Psychomotor Retardation  Concentration:  Concentration: Fair and Attention Span: Fair  Recall:  [employer] of Knowledge:  Fair  Language:  Fair  Akathisia:  No  Handed:  Right  AIMS (if indicated):     Assets:  Communication Skills Desire for Improvement Financial Resources/Insurance Housing Physical Health Resilience Social Support  ADL's:  Intact  Cognition:  WNL  Sleep:  Number of Hours: 4.5      COGNITIVE FEATURES THAT CONTRIBUTE TO RISK:  None    SUICIDE RISK:   Moderate:  Frequent suicidal ideation with limited intensity, and duration, some specificity in terms of plans, no associated intent, good self-control, limited dysphoria/symptomatology, some risk factors present, and identifiable protective factors, including available and accessible social support.  PLAN OF CARE: Hospital admission, medication management, and substance abuse counseling, discharge planning.  Mr. Sarvis is a 31 year old male with schizophrenia admitted for anxiety and hallucinations due to medication noncompliance.   1. Schizophrenia. Continue Abilify 20 mg a day along with Abilify injectable 400 mg every 30 days.  2. Anxiety. We restarted  Luvox.    3. Insomnia. Trazodone was available.   4. Cannabis use disorder. Patient has received education about the negative effects of cannabis in  schizophrenia  5. Tobacco use disorder. Nicotine patch was available.   6. Metabolic syndrome monitoring. Hemoglobin A1c, lipid panel and TSH are normal.   7. EKG. Normal sinus rhythm. QTc 394.  8. Disposition. He will be discharged back to his apartment. He will follow up with [facility] psychiatrist, peer support and day program.   I certify that inpatient services furnished can reasonably be expected to improve the patient's condition.   Kristine LineaJolanta Nicolus Ose, MD 06/27/2017, 3:52 PM

## 2017-06-28 LAB — MISC LABCORP TEST (SEND OUT)

## 2017-06-28 MED ORDER — ARIPIPRAZOLE ER 400 MG IM SRER
400.0000 mg | INTRAMUSCULAR | Status: DC
  Administered 2017-07-03: 400 mg via INTRAMUSCULAR
  Filled 2017-06-28 (×2): qty 400

## 2017-06-28 MED ORDER — HALOPERIDOL 5 MG PO TABS
10.0000 mg | ORAL_TABLET | Freq: Every day | ORAL | Status: DC
  Administered 2017-06-28 – 2017-07-02 (×5): 10 mg via ORAL
  Filled 2017-06-28 (×5): qty 2

## 2017-06-28 NOTE — [facility] Group Notes (Signed)
Goals Group Date/Time: 06/28/2017 9:00 AM Type of Therapy and Topic: Group Therapy: Goals Group: SMART Goals   Participation Level: Moderate  Description of Group:    The purpose of a daily goals group is to assist and guide patients in setting recovery/wellness-related goals. The objective is to set goals as they relate to the crisis in which they were admitted. Patients will be using SMART goal modalities to set measurable goals. Characteristics of realistic goals will be discussed and patients will be assisted in setting and processing how one will reach their goal. Facilitator will also assist patients in applying interventions and coping skills learned in psycho-education groups to the SMART goal and process how one will achieve defined goal.   Therapeutic Goals:   -Patients will develop and document one goal related to or their crisis in which brought them into treatment.  -Patients will be guided by LCSW using SMART goal setting modality in how to set a measurable, attainable, realistic and time sensitive goal.  -Patients will process barriers in reaching goal.  -Patients will process interventions in how to overcome and successful in reaching goal.   Patient's Goal: PT continues to have great difficulty formulating his thoughts.  Pt very slow to respond to CSW questions regarding goals and was only able to say he wanted to attend groups after much apparent mental effort.   Therapeutic Modalities:  Motivational Interviewing  Research officer, political partyCognitive Behavioral Therapy  Crisis Intervention Model  SMART goals setting   Daleen SquibbGreg Malinda Mayden, [place]

## 2017-06-28 NOTE — Progress Notes (Signed)
D: Pt denies SI/HI/AVH. Pt is pleasant and cooperative, affect is flat but brightens upon approach. Pt appears less anxious and he is interacting with peers and staff appropriately.  A: Pt was offered support and encouragement. Pt was given scheduled medications. Pt was encouraged to attend groups. Q 15 minute checks were done for safety.  R:Pt did not attend evening group. Pt is complaint with medication. Pt has no complaints.Pt receptive to treatment and safety maintained on unit.

## 2017-06-28 NOTE — Progress Notes (Signed)
Recreation Therapy Notes  Date: 08.16.18 Time: 9:30 am Location: Craft Room  Group Topic: Leisure Education  Goal Area(s) Addresses:  Patient will identify things they are grateful for. Patient will identify how being grateful can influence decision making.  Behavioral Response: Inattentive  Intervention: Grateful Wheel  Activity: Patients were given an I Am Grateful For worksheet and were instructed to write things they are grateful for under each category.  Education: LRT educated patients on leisure.  Education Outcome: In group clarification offered   Clinical Observations/Feedback: Patient looked at worksheet but never work on it. Patient did not contribute to group discussion.  Jacquelynn CreeGreene,Sophya Vanblarcom M, LRT/CTRS 06/28/2017 10:03 AM

## 2017-06-28 NOTE — [facility] Group Notes (Signed)
  [facility] LCSW Group Therapy Note  Date/Time: 06/27/17, 1300  Type of Therapy/Topic:  Group Therapy:  Emotion Regulation  Participation Level:  Minimal   Mood: withdrawn  Description of Group:    The purpose of this group is to assist patients in learning to regulate negative emotions and experience positive emotions. Patients will be guided to discuss ways in which they have been vulnerable to their negative emotions. These vulnerabilities will be juxtaposed with experiences of positive emotions or situations, and patients challenged to use positive emotions to combat negative ones. Special emphasis will be placed on coping with negative emotions in conflict situations, and patients will process healthy conflict resolution skills.  Therapeutic Goals: 1. Patient will identify two positive emotions or experiences to reflect on in order to balance out negative emotions:  2. Patient will label two or more emotions that they find the most difficult to experience:  3. Patient will be able to demonstrate positive conflict resolution skills through discussion or role plays:   Summary of Patient Progress: Pt attended most of the group, left with about 10 minutes remaining.  Pt staring straight ahead most of the time.  It takes pt 30 seconds or more to answer questions posed by CSW.  Pt did not appear to engage in the group topic and did not contribute to the discussion.       Therapeutic Modalities:   Cognitive Behavioral Therapy Feelings Identification Dialectical Behavioral Therapy  Edwin SquibbGreg Geroldine Esquivias, LCSW

## 2017-06-28 NOTE — Plan of Care (Signed)
Problem: Education: Goal: Knowledge of the prescribed therapeutic regimen will improve Outcome: Progressing Patient knows his medication regimen.

## 2017-06-28 NOTE — Progress Notes (Signed)
Affect flat.  Verbalizes that he is anxious although affect does not portray anxiety.  Speech is slow with delayed responses.  Organized thought patters.  No interaction noted with peers. Medication and group complaint.  Support offered.  Safety maintained.

## 2017-06-28 NOTE — [facility] Suicide Risk Assessment (Signed)
[facility] INPATIENT:  Family/Significant Other Suicide Prevention Education  Suicide Prevention Education:  Education Completed; Rivka Springeresa Meyers, mother, [phone],  (name of family member/significant other) has been identified by the patient as the family member/significant other with whom the patient will be residing, and identified as the person(s) who will aid the patient in the event of a mental health crisis (suicidal ideations/suicide attempt).  With written consent from the patient, the family member/significant other has been provided the following suicide prevention education, prior to the and/or following the discharge of the patient.  The suicide prevention education provided includes the following:  Suicide risk factors  Suicide prevention and interventions  National Suicide Hotline telephone number  [facility] assessment telephone number  [facility] Emergency Assistance 911  [facility] and/or Residential Mobile Crisis Unit telephone number  Request made of family/significant other to:  Remove weapons (e.g., guns, rifles, knives), all items previously/currently identified as safety concern.  No guns that mother is aware of.  Remove drugs/medications (over-the-counter, prescriptions, illicit drugs), all items previously/currently identified as a safety concern. No excess medications in the home, per mother.  The family member/significant other verbalizes understanding of the suicide prevention education information provided.  The family member/significant other agrees to remove the items of safety concern listed above.  Mother talks to pt several times per day but lives an hour away.  She came to pt apartment when she could not reach pt and found another person living there--she thinks this person moved in and is making pt uncomfortable and this may have contributed to pt coming to the hospital.  She is trying to get pt moved closer to her to she can help more.   She provided phone numbers: Peer support: Victory Dakinyson Farrington, [phone], [facility] support/[facility]: Bernette RedbirdKenny, [phone].  Lorri FrederickWierda, Blaize Nipper Jon, LCSW 06/28/2017, 2:38 PM

## 2017-06-28 NOTE — Plan of Care (Signed)
Problem: Safety: Goal: Ability to disclose and discuss suicidal ideas will improve Outcome: Progressing Denies any suicidal thoughts or thoughts or self harm

## 2017-06-28 NOTE — [facility] Group Notes (Signed)
[facility] LCSW Group Therapy Note  Date/Time: 816!8, 1300  Type of Therapy/Topic:  Group Therapy:  Balance in Life  Participation Level:  minimal  Description of Group:    This group will address the concept of balance and how it feels and looks when one is unbalanced. Patients will be encouraged to process areas in their lives that are out of balance, and identify reasons for remaining unbalanced. Facilitators will guide patients utilizing problem- solving interventions to address and correct the stressor making their life unbalanced. Understanding and applying boundaries will be explored and addressed for obtaining  and maintaining a balanced life. Patients will be encouraged to explore ways to assertively make their unbalanced needs known to significant others in their lives, using other group members and facilitator for support and feedback.  Therapeutic Goals: 1. Patient will identify two or more emotions or situations they have that consume much of in their lives. 2. Patient will identify signs/triggers that life has become out of balance:  3. Patient will identify two ways to set boundaries in order to achieve balance in their lives:  4. Patient will demonstrate ability to communicate their needs through discussion and/or role plays  Summary of Patient Progress: Pt continues to offer no spontaneous participation.  Pt did respond to CSW questions today and continues to be very slow to formulate an answer.  Maybe some improvement, but slight.  Pt unable to comprehend the group topic.          Therapeutic Modalities:   Cognitive Behavioral Therapy Solution-Focused Therapy Assertiveness Training  Daleen SquibbGreg Rosalena Mccorry, [place]

## 2017-06-28 NOTE — [facility] Group Notes (Signed)
[facility] Group Notes:  (Nursing/MHT/Case Management/Adjunct)  Date:  06/28/2017  Time:  4:12 PM  Type of Therapy:  Psychoeducational Skills  Participation Level:  Did Not Attend  Edwin Montes Edwin Montes 06/28/2017, 4:12 PM

## 2017-06-28 NOTE — Progress Notes (Signed)
[facility] MD Progress Note  06/28/2017 8:33 AM Rikki Smestad  MRN:  [id number]  Subjective:  Mr. Lanni is a 31 year old male with a history of schizophrenia admitted floridly psychotic in the contaxt of medication noncompliance. Restarted on Abilify, Zyprexa and Luvox.  03/28/2017. Mr. Mooneyhan feels better. He is more able to participate in conversation, does seems confused and answers questions without delay. Spoke with [name] at [facility] to confirm that the patient received Abilify maintena injection last month. Next injection on 8/21. Oral Abilify was lowered to 10 mg and Luvox discontinued by his primary psychiatrist. The patient agreed to try Haldol and possibly Haldol decanoate.  Per nursing: D: Pt denies SI/HI/AVH. Pt is pleasant and cooperative, affect is flat but brightens upon approach. Pt appears less anxious and he is interacting with peers and staff appropriately.  A: Pt was offered support and encouragement. Pt was given scheduled medications. Pt was encouraged to attend groups. Q 15 minute checks were done for safety.  R:Pt did not attend evening group. Pt is complaint with medication. Pt has no complaints.Pt receptive to treatment and safety maintained on unit.  Principal Problem: Undifferentiated schizophrenia (HCC) Diagnosis:   Patient Active Problem List   Diagnosis Date Noted  . Psychosis [F29] 06/27/2017  . Undifferentiated schizophrenia (HCC) [F20.3] 03/24/2017  . 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 Panic attack [F41.0] 06/23/2016  . Noncompliance [Z91.19] 02/29/2016  . Tobacco use disorder [F17.200] 01/27/2016   Total Time spent with patient: 30 minutes  Past Psychiatric History: Schizophrenia.  Past Medical History:  Past Medical History:  Diagnosis Date  . Anxiety   . Schizophrenia [facility])     Past Surgical History:  Procedure Laterality Date  . NO PAST SURGERIES     Family History: History reviewed. No pertinent family  history. Family Psychiatric  History: See H&P. Social History:  History  Alcohol Use No     History  Drug Use  . Types: Other-see comments    Comment: Cannibis    Social History   Social History  . Marital status: Single    Spouse name: N/A  . Number of children: N/A  . Years of education: N/A   Social History Main Topics  . Smoking status: Current Some Day Smoker    Packs/day: 0.50    Years: 1.00    Types: Cigarettes  . Smokeless tobacco: Never Used  . Alcohol use No  . Drug use: Yes    Types: Other-see comments     Comment: Cannibis  . Sexual activity: Not Currently   Other Topics Concern  . None   Social History Narrative  . None   Additional Social History:    Pain Medications:  (none) Prescriptions: see PTA History of alcohol / drug use?: Yes (cannibis use twice a month) Withdrawal Symptoms: (S) Other (Comment) (none)                    Sleep: Fair  Appetite:  Fair  Current Medications: Current Facility-Administered Medications  Medication Dose Route Frequency Provider Last Rate Last Dose  . acetaminophen (TYLENOL) tablet 650 mg  650 mg Oral Q6H PRN Jimmy Footman, MD      . alum & mag hydroxide-simeth (MAALOX/MYLANTA) 200-200-20 MG/5ML suspension 30 mL  30 mL Oral Q4H PRN Jimmy Footman, MD      . ARIPiprazole (ABILIFY) tablet 20 mg  20 mg Oral Daily Kamsiyochukwu Spickler B, MD   20 mg at 06/27/17 1637  .  fluvoxaMINE (LUVOX) tablet 50 mg  50 mg Oral QHS Jodiann Ognibene B, MD   50 mg at 06/27/17 2158  . hydrOXYzine (ATARAX/VISTARIL) tablet 25 mg  25 mg Oral TID PRN Jimmy FootmanHernandez-Gonzalez, Andrea, MD      . magnesium hydroxide (MILK OF MAGNESIA) suspension 30 mL  30 mL Oral Daily PRN Jimmy FootmanHernandez-Gonzalez, Andrea, MD      . OLANZapine zydis (ZYPREXA) disintegrating tablet 5 mg  5 mg Oral QHS Ysabelle Goodroe B, MD   5 mg at 06/27/17 2157  . traZODone (DESYREL) tablet 100 mg  100 mg Oral QHS Calil Amor B, MD   100 mg  at 06/27/17 2157    Lab Results:  Results for orders placed or performed during the hospital encounter of 06/26/17 (from the past 48 hour(s))  Lipid panel     Status: Abnormal   Collection Time: 06/27/17  6:49 AM  Result Value Ref Range   Cholesterol 183 0 - 200 mg/dL   Triglycerides 99 <161<150 mg/dL   HDL 47 >09>40 mg/dL   Total CHOL/HDL Ratio 3.9 RATIO   VLDL 20 0 - 40 mg/dL   LDL Cholesterol 604116 (H) 0 - 99 mg/dL    Comment:        Total Cholesterol/HDL:CHD Risk Coronary Heart Disease Risk Table                     Men   Women  1/2 Average Risk   3.4   3.3  Average Risk       5.0   4.4  2 X Average Risk   9.6   7.1  3 X Average Risk  23.4   11.0        Use the calculated Patient Ratio above and the CHD Risk Table to determine the patient's CHD Risk.        ATP III CLASSIFICATION (LDL):  <100     mg/dL   Optimal  540-981100-129  mg/dL   Near or Above                    Optimal  130-159  mg/dL   Borderline  191-478160-189  mg/dL   High  >295>190     mg/dL   Very High   TSH     Status: None   Collection Time: 06/27/17  6:49 AM  Result Value Ref Range   TSH 2.495 0.350 - 4.500 uIU/mL    Comment: Performed by a 3rd Generation assay with a functional sensitivity of <=0.01 uIU/mL.  Miscellaneous [facility] test (send-out)     Status: None   Collection Time: 06/27/17  6:49 AM  Result Value Ref Range   [facility] test code HEMOGLOBIN A1C    [facility] test name HEMOGLOBIN A1C    Misc [facility] result COMMENT     Comment: (NOTE) Test Ordered: [id number] Hemoglobin A1c Hemoglobin A1c                 5.0              %        BN     Reference Range: 4.8-5.6                                       Prediabetes: 5.7 - 6.4         Diabetes: >6.4  Glycemic control for adults with diabetes: <7.0 Performed At: [facility] [address] [place], [place] [id number] Mila Homer MD ZD:[id number]     Blood Alcohol level:  Lab Results  Component Value Date   [facility] <5 06/25/2017   ETH <5 03/23/2017     Metabolic Disorder Labs: Lab Results  Component Value Date   HGBA1C 5.2 03/26/2017   MPG 103 03/26/2017   MPG 103 11/03/2016   Lab Results  Component Value Date   PROLACTIN 1.7 (L) 01/27/2016   Lab Results  Component Value Date   CHOL 183 06/27/2017   TRIG 99 06/27/2017   HDL 47 06/27/2017   CHOLHDL 3.9 06/27/2017   VLDL 20 06/27/2017   LDLCALC 116 (H) 06/27/2017   LDLCALC 128 (H) 03/26/2017    Physical Findings: AIMS: Facial and Oral Movements Muscles of Facial Expression: None, normal Lips and Perioral Area: None, normal Jaw: None, normal Tongue: None, normal,Extremity Movements Upper (arms, wrists, hands, fingers): None, normal Lower (legs, knees, ankles, toes): None, normal, Trunk Movements Neck, shoulders, hips: None, normal, Overall Severity Severity of abnormal movements (highest score from questions above): None, normal Incapacitation due to abnormal movements: None, normal Patient's awareness of abnormal movements (rate only patient's report): No Awareness, Dental Status Current problems with teeth and/or dentures?: No Does patient usually wear dentures?: No  CIWA:  CIWA-Ar Total: 0 COWS:  COWS Total Score: 0  Musculoskeletal: Strength & Muscle Tone: within normal limits Gait & Station: normal Patient leans: N/A  Psychiatric Specialty Exam: Physical Exam  Nursing note and vitals reviewed. Psychiatric: His affect is blunt and inappropriate. His speech is delayed. He is slowed, withdrawn and actively hallucinating. Thought content is paranoid. Cognition and memory are impaired. He expresses inappropriate judgment.    Review of Systems  Psychiatric/Behavioral: Positive for hallucinations. The patient has insomnia.   All other systems reviewed and are negative.   Blood pressure 105/71, pulse 68, temperature 97.6 F (36.4 C), temperature source Oral, resp. rate 18, height 6\' 2"  (1.88 m), weight 94.3 kg (208 lb), SpO2 99 %.Body mass index is 26.71 kg/m.   General Appearance: Casual  Eye Contact:  Fair  Speech:  Slow and latency and poverty of speech.  Volume:  Decreased  Mood:  Depressed  Affect:  Blunt  Thought Process:  Disorganized and Descriptions of Associations: Loose  Orientation:  Full (Time, Place, and Person)  Thought Content:  Delusions, Hallucinations: Auditory, Paranoid Ideation and Rumination  Suicidal Thoughts:  No  Homicidal Thoughts:  No  Memory:  Immediate;   Fair Recent;   Fair Remote;   Fair  Judgement:  Poor  Insight:  Lacking  Psychomotor Activity:  Psychomotor Retardation  Concentration:  Concentration: Poor and Attention Span: Poor  Recall:  Poor  Fund of Knowledge:  Fair  Language:  Fair  Akathisia:  No  Handed:  Right  AIMS (if indicated):     Assets:  Communication Skills Desire for Improvement Financial Resources/Insurance Housing Physical Health Resilience Social Support  ADL's:  Intact  Cognition:  WNL  Sleep:  Number of Hours: 7     Treatment Plan Summary: Daily contact with patient to assess and evaluate symptoms and progress in treatment and Medication management   Mr. Yeates is a 31 year old male with schizophrenia admitted for anxiety and hallucinations due to medication noncompliance.   1. Schizophrenia. Continue Abilify 20 mg a day along with Abilify injectable 400 mg every 30 days. Spoke with [name] at [facility]. His next injection is  due on 07/03/2017. I will add Haldol tonight.   2. Anxiety. We restarted Luvox. Will increase to 100 mg nightly.  3. Insomnia. Trazodone is available.   4. Cannabis use disorder. Patient has received education about the negative effects of cannabis in schizophrenia  5. Tobacco use disorder. Nicotine patch was available.   6. Metabolic syndrome monitoring. Hemoglobin A1c, lipid panel and TSH are normal.   7. EKG. Normal sinus rhythm. QTc 394.  8. Disposition. He will be discharged back to his apartment. He will follow up with [facility] psychiatrist,  peer support and day program.  Kristine Linea, MD 06/28/2017, 8:33 AM

## 2017-06-29 MED ORDER — HALOPERIDOL DECANOATE 100 MG/ML IM SOLN
100.0000 mg | INTRAMUSCULAR | Status: DC
  Administered 2017-06-30: 100 mg via INTRAMUSCULAR
  Filled 2017-06-29: qty 1

## 2017-06-29 NOTE — Plan of Care (Signed)
Problem: Activity: Goal: Interest or engagement in leisure activities will improve Outcome: Progressing Attends group with fair amount of particiapation.

## 2017-06-29 NOTE — Plan of Care (Signed)
Problem: Education: Goal: Knowledge of the prescribed therapeutic regimen will improve Outcome: Progressing Patient is knowledgeable of prescribed regimen.
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
## 2017-06-29 NOTE — Progress Notes (Signed)
Patient affect is tensed and pained looking.  Denies SI/HI/AVH although exhibits behaviors at times of responding to internal stimuli. Attends groups.  No interaction with peers when out of room.  Support offered.  Scheduled medications given.  Safety rounds maintained.

## 2017-06-29 NOTE — [facility] Group Notes (Signed)
[facility] Group Notes:  (Nursing/MHT/Case Management/Adjunct)  Date:  06/29/2017  Time:  5:24 AM  Type of Therapy:  Psychoeducational Skills  Participation Level:  Active  Participation Quality:  Appropriate  Affect:  Appropriate  Cognitive:  Appropriate  Insight:  Appropriate  Engagement in Group:  Engaged  Modes of Intervention:  Discussion, Socialization and Support  Summary of Progress/Problems:  Chancy Milroy 06/29/2017, 5:24 AM

## 2017-06-29 NOTE — Progress Notes (Signed)
Recreation Therapy Notes  Date: 08.17.18 Time: 9:30 am Location: Craft Room  Group Topic: Coping Skills  Goal Area(s) Addresses:  Patient will verbalize benefit of using art as a coping skill. Patient will verbalize one emotion experienced in group.  Behavioral Response: Inattentive  Intervention: Coloring  Activity: Patients were give coloring sheets to color and were instructed to think about the emotions they were feeling as well as what their minds were focused on.  Education: LRT educated patients on healthy coping skills.  Education Outcome: In group clarification offered  Clinical Observations/Feedback: Patient looked at coloring sheet for a while. Patient then held a marker for a while. Patient eventually started coloring around 10:00 am. Patient did not contribute to group discussion.  Jacquelynn Cree, LRT/CTRS 06/29/2017 10:17 AM

## 2017-06-29 NOTE — Progress Notes (Signed)
D: Pt denies SI/HI/AVH, affect is flat, mood is cooperative with treatment plan. Pt thoughts are less organized, speech is soft with some delayed response noted. appears less anxious and he is interacting with peers and staff appropriately.  A: Pt was offered support and encouragement. Pt was given scheduled medications. Pt was encouraged to attend groups. Q 15 minute checks were done for safety.  R:Pt attends groups and interacts well with peers and staff. Pt is taking medication. Pt has no complaints.Pt receptive to treatment and safety maintained on unit.
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
## 2017-06-29 NOTE — Progress Notes (Signed)
Otero met with pt for spiritual group. Pt states he had relapse of his medication that led to a meltdown. Pt states he had a support group that the thinks will be there for him. Pt was open during discussion and states he will develop his goals to guide him post hospital. Pt was calm and cooperative during the group discussion.    06/29/17 1600  Clinical Encounter Type  Visited With Patient  Visit Type Initial;Psychological support;Spiritual support;Other (Comment)  Referral From Nurse  Consult/Referral To [place] (Comment)

## 2017-06-29 NOTE — Progress Notes (Signed)
[facility] MD Progress Note  06/29/2017 4:30 PM Edwin Montes  MRN:  [id number]  Subjective:  [name] is a 31 year old male with a history of schizophrenia admitted floridly psychotic in the contaxt of medication noncompliance. Restarted on Abilify, Zyprexa and Luvox.  03/28/2017. [name] feels better. He is more able to participate in conversation, does seems confused and answers questions without delay. Spoke with Edwin Montes at [facility] to confirm that the patient received Abilify maintena injection last month. Next injection on 8/21. Oral Abilify was lowered to 10 mg and Luvox discontinued by his primary psychiatrist. The patient agreed to try Haldol and possibly Haldol decanoate.  03/29/2017. [name] is actively hallucinating unable to engage in conversation. He has no complaints and denies any symptoms but when pressed admits to hearing voices. SW spoke with his peer support and housing supervisor. There are worries that the patient is functioning badly in independent setting. He is compliant with Abilify maintenna but not oral medication. I gave him oral Haldol last night with no side effects. I will give another oral dose tonight and Haldol decanoate injection tomorrow. He does have OCD but no mood component.  Per nursing: D: Pt denies SI/HI/AVH, affect is flat, mood is cooperative with treatment plan. Pt thoughts are less organized, speech is soft with some delayed response noted. appears less anxious and he is interacting with peers and staff appropriately.  A: Pt was offered support and encouragement. Pt was given scheduled medications. Pt was encouraged to attend groups. Q 15 minute checks were done for safety.  R:Pt attends groups and interacts well with peers and staff. Pt is taking medication. Pt has no complaints.Pt receptive to treatment and safety maintained on unit.  Principal Problem: Undifferentiated schizophrenia (HCC) Diagnosis:   Patient Active Problem List   Diagnosis Date Noted   . Psychosis [F29] 06/27/2017  . Undifferentiated schizophrenia (HCC) [F20.3] 03/24/2017  . 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 Panic attack [F41.0] 06/23/2016  . Noncompliance [Z91.19] 02/29/2016  . Tobacco use disorder [F17.200] 01/27/2016   Total Time spent with patient: 30 minutes  Past Psychiatric History: Schizophrenia.  Past Medical History:  Past Medical History:  Diagnosis Date  . Anxiety   . Schizophrenia [facility])     Past Surgical History:  Procedure Laterality Date  . NO PAST SURGERIES     Family History: History reviewed. No pertinent family history. Family Psychiatric  History: See H&P. Social History:  History  Alcohol Use No     History  Drug Use  . Types: Other-see comments    Comment: Cannibis    Social History   Social History  . Marital status: Single    Spouse name: N/A  . Number of children: N/A  . Years of education: N/A   Social History Main Topics  . Smoking status: Current Some Day Smoker    Packs/day: 0.50    Years: 1.00    Types: Cigarettes  . Smokeless tobacco: Never Used  . Alcohol use No  . Drug use: Yes    Types: Other-see comments     Comment: Cannibis  . Sexual activity: Not Currently   Other Topics Concern  . None   Social History Narrative  . None   Additional Social History:    Pain Medications:  (none) Prescriptions: see PTA History of alcohol / drug use?: Yes (cannibis use twice a month) Withdrawal Symptoms: (S) Other (Comment) (none)  Sleep: Fair  Appetite:  Fair  Current Medications: Current Facility-Administered Medications  Medication Dose Route Frequency Provider Last Rate Last Dose  . acetaminophen (TYLENOL) tablet 650 mg  650 mg Oral Q6H PRN Jimmy Footman, MD      . alum & mag hydroxide-simeth (MAALOX/MYLANTA) 200-200-20 MG/5ML suspension 30 mL  30 mL Oral Q4H PRN Jimmy Footman, MD      . ARIPiprazole (ABILIFY)  tablet 20 mg  20 mg Oral Daily Carold Eisner B, MD   20 mg at 06/29/17 0826  . [START ON 07/03/2017] ARIPiprazole ER SRER 400 mg  400 mg Intramuscular Q28 days Ammon Muscatello B, MD      . fluvoxaMINE (LUVOX) tablet 50 mg  50 mg Oral QHS Gamaliel Charney B, MD   50 mg at 06/28/17 2129  . haloperidol (HALDOL) tablet 10 mg  10 mg Oral QHS Alfie Rideaux B, MD   10 mg at 06/28/17 2129  . hydrOXYzine (ATARAX/VISTARIL) tablet 25 mg  25 mg Oral TID PRN Jimmy Footman, MD   25 mg at 06/28/17 2129  . magnesium hydroxide (MILK OF MAGNESIA) suspension 30 mL  30 mL Oral Daily PRN Jimmy Footman, MD      . traZODone (DESYREL) tablet 100 mg  100 mg Oral QHS Mikai Meints B, MD   100 mg at 06/28/17 2129    Lab Results:  No results found for this or any previous visit (from the past 48 hour(s)).  Blood Alcohol level:  Lab Results  Component Value Date   ETH <5 06/25/2017   ETH <5 03/23/2017    Metabolic Disorder Labs: Lab Results  Component Value Date   HGBA1C 5.2 03/26/2017   MPG 103 03/26/2017   MPG 103 11/03/2016   Lab Results  Component Value Date   PROLACTIN 1.7 (L) 01/27/2016   Lab Results  Component Value Date   CHOL 183 06/27/2017   TRIG 99 06/27/2017   HDL 47 06/27/2017   CHOLHDL 3.9 06/27/2017   VLDL 20 06/27/2017   LDLCALC 116 (H) 06/27/2017   LDLCALC 128 (H) 03/26/2017    Physical Findings: AIMS: Facial and Oral Movements Muscles of Facial Expression: None, normal Lips and Perioral Area: None, normal Jaw: None, normal Tongue: None, normal,Extremity Movements Upper (arms, wrists, hands, fingers): None, normal Lower (legs, knees, ankles, toes): None, normal, Trunk Movements Neck, shoulders, hips: None, normal, Overall Severity Severity of abnormal movements (highest score from questions above): None, normal Incapacitation due to abnormal movements: None, normal Patient's awareness of abnormal movements (rate only patient's  report): No Awareness, Dental Status Current problems with teeth and/or dentures?: No Does patient usually wear dentures?: No  CIWA:  CIWA-Ar Total: 0 COWS:  COWS Total Score: 0  Musculoskeletal: Strength & Muscle Tone: within normal limits Gait & Station: normal Patient leans: N/A  Psychiatric Specialty Exam: Physical Exam  Nursing note and vitals reviewed. Psychiatric: His affect is blunt and inappropriate. His speech is delayed. He is slowed, withdrawn and actively hallucinating. Thought content is paranoid. Cognition and memory are impaired. He expresses inappropriate judgment.    Review of Systems  Psychiatric/Behavioral: Positive for hallucinations. The patient has insomnia.   All other systems reviewed and are negative.   Blood pressure 108/62, pulse 67, temperature (!) 97.5 F (36.4 C), temperature source Oral, resp. rate 18, height 6\' 2"  (1.88 m), weight 94.3 kg (208 lb), SpO2 99 %.Body mass index is 26.71 kg/m.  General Appearance: Casual  Eye Contact:  Fair  Speech:  Slow and  latency and poverty of speech.  Volume:  Decreased  Mood:  Depressed  Affect:  Blunt  Thought Process:  Disorganized and Descriptions of Associations: Loose  Orientation:  Full (Time, Place, and Person)  Thought Content:  Delusions, Hallucinations: Auditory, Paranoid Ideation and Rumination  Suicidal Thoughts:  No  Homicidal Thoughts:  No  Memory:  Immediate;   Fair Recent;   Fair Remote;   Fair  Judgement:  Poor  Insight:  Lacking  Psychomotor Activity:  Psychomotor Retardation  Concentration:  Concentration: Poor and Attention Span: Poor  Recall:  Poor  Fund of Knowledge:  Fair  Language:  Fair  Akathisia:  No  Handed:  Right  AIMS (if indicated):     Assets:  Communication Skills Desire for Improvement Financial Resources/Insurance Housing Physical Health Resilience Social Support  ADL's:  Intact  Cognition:  WNL  Sleep:  Number of Hours: 6.75     Treatment Plan  Summary: Daily contact with patient to assess and evaluate symptoms and progress in treatment and Medication management   Mr. Youman is a 32 year old male with schizophrenia admitted for anxiety and hallucinations due to medication noncompliance.   1. Schizophrenia. Continue Abilify 20 mg a day along with Abilify injectable 400 mg every 30 days. Spoke with Edwin Montes at [employer]. His next injection is due on 07/03/2017. I added 10 mg Haldol nightly. I will order Haldol decanoate injection for tomorrow.    2. Anxiety. We restarted Luvox. He is taking 100 mg nightly.  3. Insomnia. Trazodone is available.   4. Cannabis use disorder. Patient has received education about the negative effects of cannabis in schizophrenia  5. Tobacco use disorder. Nicotine patch was available.   6. Metabolic syndrome monitoring. Hemoglobin A1c, lipid panel and TSH are normal.   7. EKG. Normal sinus rhythm. QTc 394.  8. Disposition. He will be discharged back to his apartment, at least temporarily. He will follow up with [facility] psychiatrist, peer support and day program.  Kristine Linea, MD 06/29/2017, 4:30 PM

## 2017-06-30 NOTE — [facility] Group Notes (Signed)
[facility] Group Notes:  (Nursing/MHT/Case Management/Adjunct)  Date:  06/30/2017  Time:  9:53 PM  Type of Therapy:  Evening Wrap-up Group  Participation Level:  Minimal  Participation Quality:  Minimal  Affect:  Flat  Cognitive:  Appropriate  Insight:  Improving  Engagement in Group:  Limited  Modes of Intervention:  Discussion  Summary of Progress/Problems:  Edwin Montes 06/30/2017, 9:53 PM

## 2017-06-30 NOTE — Progress Notes (Signed)
Affect flat and pained. Denies SI/HI/AVH.  Dressed neatly although has strong body odor.  Attending groups, medication compliant.  Support offered.  Safety maintained.

## 2017-06-30 NOTE — Progress Notes (Signed)
[facility] MD Progress Note  06/30/2017 2:49 PM Edwin Montes  MRN:  [id number]  Subjective:  Edwin Montes is a 31 year old male with a history of schizophrenia admitted floridly psychotic in the contaxt of medication noncompliance. Restarted on Abilify, Zyprexa and Luvox.  03/28/2017. Edwin Montes feels better. He is more able to participate in conversation, does seems confused and answers questions without delay. Spoke with [name] at [facility] to confirm that the patient received Abilify maintena injection last month. Next injection on 8/21. Oral Abilify was lowered to 10 mg and Luvox discontinued by his primary psychiatrist. The patient agreed to try Haldol and possibly Haldol decanoate.  03/29/2017. Edwin Montes is actively hallucinating unable to engage in conversation. He has no complaints and denies any symptoms but when pressed admits to hearing voices. SW spoke with his peer support and housing supervisor. There are worries that the patient is functioning badly in independent setting. He is compliant with Abilify maintenna but not oral medication. I gave him oral Haldol last night with no side effects. I will give another oral dose tonight and Haldol decanoate injection tomorrow. He does have OCD but no mood component  Follow-up for Saturday, August 18. Patient seen. He has no complaints. He is neatly dressed but his affect is flat. He says that he no longer feels his thoughts are racing. Denies any suicidal or homicidal ideation. Seems preoccupied but was able to answer basic questions..  Per nursing: D: Pt denies SI/HI/AVH, affect is flat, mood is cooperative with treatment plan. Pt thoughts are less organized, speech is soft with some delayed response noted. appears less anxious and he is interacting with peers and staff appropriately.  A: Pt was offered support and encouragement. Pt was given scheduled medications. Pt was encouraged to attend groups. Q 15 minute checks were done for safety.  R:Pt attends  groups and interacts well with peers and staff. Pt is taking medication. Pt has no complaints.Pt receptive to treatment and safety maintained on unit.  Principal Problem: Undifferentiated schizophrenia (HCC) Diagnosis:   Patient Active Problem List   Diagnosis Date Noted  . Psychosis [F29] 06/27/2017  . Undifferentiated schizophrenia (HCC) [F20.3] 03/24/2017  . 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 Panic attack [F41.0] 06/23/2016  . Noncompliance [Z91.19] 02/29/2016  . Tobacco use disorder [F17.200] 01/27/2016   Total Time spent with patient: 30 minutes  Past Psychiatric History: Schizophrenia.  Past Medical History:  Past Medical History:  Diagnosis Date  . Anxiety   . Schizophrenia [facility])     Past Surgical History:  Procedure Laterality Date  . NO PAST SURGERIES     Family History: History reviewed. No pertinent family history. Family Psychiatric  History: See H&P. Social History:  History  Alcohol Use No     History  Drug Use  . Types: Other-see comments    Comment: Cannibis    Social History   Social History  . Marital status: Single    Spouse name: N/A  . Number of children: N/A  . Years of education: N/A   Social History Main Topics  . Smoking status: Current Some Day Smoker    Packs/day: 0.50    Years: 1.00    Types: Cigarettes  . Smokeless tobacco: Never Used  . Alcohol use No  . Drug use: Yes    Types: Other-see comments     Comment: Cannibis  . Sexual activity: Not Currently   Other Topics Concern  . None   Social History  Narrative  . None   Additional Social History:    Pain Medications:  (none) Prescriptions: see PTA History of alcohol / drug use?: Yes (cannibis use twice a month) Withdrawal Symptoms: (S) Other (Comment) (none)                    Sleep: Fair  Appetite:  Fair  Current Medications: Current Facility-Administered Medications  Medication Dose Route Frequency Provider Last  Rate Last Dose  . acetaminophen (TYLENOL) tablet 650 mg  650 mg Oral Q6H PRN Jimmy Footman, MD      . alum & mag hydroxide-simeth (MAALOX/MYLANTA) 200-200-20 MG/5ML suspension 30 mL  30 mL Oral Q4H PRN Jimmy Footman, MD      . ARIPiprazole (ABILIFY) tablet 20 mg  20 mg Oral Daily Pucilowska, Jolanta B, MD   20 mg at 06/30/17 0923  . [START ON 07/03/2017] ARIPiprazole ER SRER 400 mg  400 mg Intramuscular Q28 days Pucilowska, Jolanta B, MD      . fluvoxaMINE (LUVOX) tablet 50 mg  50 mg Oral QHS Pucilowska, Jolanta B, MD   50 mg at 06/29/17 2145  . haloperidol (HALDOL) tablet 10 mg  10 mg Oral QHS Pucilowska, Jolanta B, MD   10 mg at 06/29/17 2145  . haloperidol decanoate (HALDOL DECANOATE) 100 MG/ML injection 100 mg  100 mg Intramuscular Q28 days Pucilowska, Jolanta B, MD   100 mg at 06/30/17 8453  . hydrOXYzine (ATARAX/VISTARIL) tablet 25 mg  25 mg Oral TID PRN Jimmy Footman, MD   25 mg at 06/29/17 2145  . magnesium hydroxide (MILK OF MAGNESIA) suspension 30 mL  30 mL Oral Daily PRN Jimmy Footman, MD      . traZODone (DESYREL) tablet 100 mg  100 mg Oral QHS Pucilowska, Jolanta B, MD   100 mg at 06/29/17 2145    Lab Results:  No results found for this or any previous visit (from the past 48 hour(s)).  Blood Alcohol level:  Lab Results  Component Value Date   ETH <5 06/25/2017   ETH <5 03/23/2017    Metabolic Disorder Labs: Lab Results  Component Value Date   HGBA1C 5.2 03/26/2017   MPG 103 03/26/2017   MPG 103 11/03/2016   Lab Results  Component Value Date   PROLACTIN 1.7 (L) 01/27/2016   Lab Results  Component Value Date   CHOL 183 06/27/2017   TRIG 99 06/27/2017   HDL 47 06/27/2017   CHOLHDL 3.9 06/27/2017   VLDL 20 06/27/2017   LDLCALC 116 (H) 06/27/2017   LDLCALC 128 (H) 03/26/2017    Physical Findings: AIMS: Facial and Oral Movements Muscles of Facial Expression: None, normal Lips and Perioral Area: None,  normal Jaw: None, normal Tongue: None, normal,Extremity Movements Upper (arms, wrists, hands, fingers): None, normal Lower (legs, knees, ankles, toes): None, normal, Trunk Movements Neck, shoulders, hips: None, normal, Overall Severity Severity of abnormal movements (highest score from questions above): None, normal Incapacitation due to abnormal movements: None, normal Patient's awareness of abnormal movements (rate only patient's report): No Awareness, Dental Status Current problems with teeth and/or dentures?: No Does patient usually wear dentures?: No  CIWA:  CIWA-Ar Total: 0 COWS:  COWS Total Score: 0  Musculoskeletal: Strength & Muscle Tone: within normal limits Gait & Station: normal Patient leans: N/A  Psychiatric Specialty Exam: Physical Exam  Nursing note and vitals reviewed. Psychiatric: His affect is blunt. His affect is not inappropriate. His speech is delayed. He is slowed, withdrawn and actively hallucinating. Thought  content is paranoid. Cognition and memory are impaired. He expresses inappropriate judgment.    Review of Systems  Psychiatric/Behavioral: Negative for hallucinations. The patient has insomnia.   All other systems reviewed and are negative.   Blood pressure 126/73, pulse 60, temperature 97.7 F (36.5 C), resp. rate 18, height 6\' 2"  (1.88 m), weight 208 lb (94.3 kg), SpO2 99 %.Body mass index is 26.71 kg/m.  General Appearance: Casual  Eye Contact:  Fair  Speech:  Slow and latency and poverty of speech.  Volume:  Decreased  Mood:  Depressed  Affect:  Blunt  Thought Process:  Disorganized and Descriptions of Associations: Loose  Orientation:  Full (Time, Place, and Person)  Thought Content:  Delusions, Hallucinations: Auditory, Paranoid Ideation and Rumination  Suicidal Thoughts:  No  Homicidal Thoughts:  No  Memory:  Immediate;   Fair Recent;   Fair Remote;   Fair  Judgement:  Poor  Insight:  Lacking  Psychomotor Activity:  Psychomotor  Retardation  Concentration:  Concentration: Poor and Attention Span: Poor  Recall:  Poor  Fund of Knowledge:  Fair  Language:  Fair  Akathisia:  No  Handed:  Right  AIMS (if indicated):     Assets:  Communication Skills Desire for Improvement Financial Resources/Insurance Housing Physical Health Resilience Social Support  ADL's:  Intact  Cognition:  WNL  Sleep:  Number of Hours: 6.15     Treatment Plan Summary: Daily contact with patient to assess and evaluate symptoms and progress in treatment and Medication management   Edwin Montes is a 31 year old male with schizophrenia admitted for anxiety and hallucinations due to medication noncompliance.   1. Schizophrenia. Continue Abilify 20 mg a day along with Abilify injectable 400 mg every 30 days. Spoke with [name] at [facility]. His next injection is due on 07/03/2017. I added 10 mg Haldol nightly. I will order Haldol decanoate injection for tomorrow.    2. Anxiety. We restarted Luvox. He is taking 100 mg nightly.  3. Insomnia. Trazodone is available.   4. Cannabis use disorder. Patient has received education about the negative effects of cannabis in schizophrenia  5. Tobacco use disorder. Nicotine patch was available.   6. Metabolic syndrome monitoring. Hemoglobin A1c, lipid panel and TSH are normal.   7. EKG. Normal sinus rhythm. QTc 394.  8. Disposition. He will be discharged back to his apartment, at least temporarily. He will follow up with [facility] psychiatrist, peer support and day program.  Patient appears to be possibly starting to stabilize more. No specific complaints today. No evidence of acute dangerousness. Encourage patient to continue being compliant with medicine. No change to medical plan. Possible discharge later this week if he continues to do well.  Mordecai Rasmussen, MD 06/30/2017, 2:49 PM

## 2017-06-30 NOTE — [facility] Group Notes (Signed)
[facility] LCSW Group Therapy  06/30/2017 3:52 PM  Type of Therapy:  Group Therapy  Participation Level:  Minimal  Participation Quality:  Appropriate  Affect:  Flat  Cognitive:  Lacking  Insight:  Limited  Engagement in Therapy:  Lacking and Limited  Modes of Intervention:  Activity, Clarification, Discussion, Education, Exploration, Problem-solving, Reality Testing, Socialization and Support  Summary of Progress/Problems: Communications: Patients identify how individuals communicate with one another appropriately and inappropriately. Patients will be guided to discuss their thoughts, feelings, and behaviors related to barriers when communicating. The group will process together ways to execute positive and appropriate communications. Patient was attentive but had difficulty responding to group discussion. Patient would engage when prompted by CSW but his responses were significantly delayed taking about 30 seconds or longer to response.   Shandee Jergens G. Garnette Czech MSW, LCSWA 06/30/2017, 3:58 PM

## 2017-06-30 NOTE — Plan of Care (Signed)
Problem: Safety: Goal: Ability to remain free from injury will improve Outcome: Progressing Remains safe on unit

## 2017-06-30 NOTE — Progress Notes (Signed)
D: Pt denies SI/HI/AVH, but noted responding to internal stimuli. Patient's affect is flat, mood is cooperative with treatment plan. Pt thoughts are less disorganized, speech is soft with some delayed response noted, he appears less anxious and he is not interacting with peers and staff appropriately.  A: Pt was offered support and encouragement. Pt was given scheduled medications. Pt was encouraged to attend groups. Q 15 minute checks were done for safety.  R:Pt did not attend evening group.Patient is not interacting appropriately with peers and staff. Pt is complaint with medication. Pt has no complaints.Pt receptive to treatment and safety maintained on unit.

## 2017-07-01 NOTE — Progress Notes (Signed)
[facility] MD Progress Note  07/01/2017 2:28 PM Edwin Montes  MRN:  [id number]  Subjective:  Edwin Montes is a 31 year old male with a history of schizophrenia admitted floridly psychotic in the contaxt of medication noncompliance. Restarted on Abilify, Zyprexa and Luvox.  03/28/2017. Edwin Montes feels better. He is more able to participate in conversation, does seems confused and answers questions without delay. Spoke with [name] at [facility] to confirm that the Edwin Montes received Abilify maintena injection last month. Next injection on 8/21. Oral Abilify was lowered to 10 mg and Luvox discontinued by his primary psychiatrist. The Edwin Montes agreed to try Haldol and possibly Haldol decanoate.  03/29/2017. Edwin Montes is actively hallucinating unable to engage in conversation. He has no complaints and denies any symptoms but when pressed admits to hearing voices. SW spoke with his peer support and housing supervisor. There are worries that the Edwin Montes is functioning badly in independent setting. He is compliant with Abilify maintenna but not oral medication. I gave him oral Haldol last night with no side effects. I will give another oral dose tonight and Haldol decanoate injection tomorrow. He does have OCD but no mood component  Follow-up for Saturday, August 18. Edwin Montes seen. He has no complaints. He is neatly dressed but his affect is flat. He says that he no longer feels his thoughts are racing. Denies any suicidal or homicidal ideation. Seems preoccupied but was able to answer basic questions..  Follow-up for Sunday the 19th. Edwin Montes has no new complaints. Affect is flat but he reports that his thoughts are feeling normal. Denies suicidal thoughts. He is requesting discharge soon.  Per nursing: D: Pt denies SI/HI/AVH, affect is flat, mood is cooperative with treatment plan. Pt thoughts are less organized, speech is soft with some delayed response noted. appears less anxious and he is interacting with peers and staff  appropriately.  A: Pt was offered support and encouragement. Pt was given scheduled medications. Pt was encouraged to attend groups. Q 15 minute checks were done for safety.  R:Pt attends groups and interacts well with peers and staff. Pt is taking medication. Pt has no complaints.Pt receptive to treatment and safety maintained on unit.  Principal Problem: Undifferentiated schizophrenia (HCC) Diagnosis:   Edwin Montes Active Problem List   Diagnosis Date Noted  . Psychosis [F29] 06/27/2017  . Undifferentiated schizophrenia (HCC) [F20.3] 03/24/2017  . 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 Panic attack [F41.0] 06/23/2016  . Noncompliance [Z91.19] 02/29/2016  . Tobacco use disorder [F17.200] 01/27/2016   Total Time spent with Edwin Montes: 30 minutes  Past Psychiatric History: Schizophrenia.  Past Medical History:  Past Medical History:  Diagnosis Date  . Anxiety   . Schizophrenia [facility])     Past Surgical History:  Procedure Laterality Date  . NO PAST SURGERIES     Family History: History reviewed. No pertinent family history. Family Psychiatric  History: See H&P. Social History:  History  Alcohol Use No     History  Drug Use  . Types: Other-see comments    Comment: Cannibis    Social History   Social History  . Marital status: Single    Spouse name: N/A  . Number of children: N/A  . Years of education: N/A   Social History Main Topics  . Smoking status: Current Some Day Smoker    Packs/day: 0.50    Years: 1.00    Types: Cigarettes  . Smokeless tobacco: Never Used  . Alcohol use No  . Drug use:  Yes    Types: Other-see comments     Comment: Cannibis  . Sexual activity: Not Currently   Other Topics Concern  . None   Social History Narrative  . None   Additional Social History:    Pain Medications:  (none) Prescriptions: see PTA History of alcohol / drug use?: Yes (cannibis use twice a month) Withdrawal Symptoms: (S) Other  (Comment) (none)                    Sleep: Fair  Appetite:  Fair  Current Medications: Current Facility-Administered Medications  Medication Dose Route Frequency Provider Last Rate Last Dose  . acetaminophen (TYLENOL) tablet 650 mg  650 mg Oral Q6H PRN Jimmy Footman, MD      . alum & mag hydroxide-simeth (MAALOX/MYLANTA) 200-200-20 MG/5ML suspension 30 mL  30 mL Oral Q4H PRN Jimmy Footman, MD      . ARIPiprazole (ABILIFY) tablet 20 mg  20 mg Oral Daily Pucilowska, Jolanta B, MD   20 mg at 07/01/17 0824  . [START ON 07/03/2017] ARIPiprazole ER SRER 400 mg  400 mg Intramuscular Q28 days Pucilowska, Jolanta B, MD      . fluvoxaMINE (LUVOX) tablet 50 mg  50 mg Oral QHS Pucilowska, Jolanta B, MD   50 mg at 06/30/17 2119  . haloperidol (HALDOL) tablet 10 mg  10 mg Oral QHS Pucilowska, Jolanta B, MD   10 mg at 06/30/17 2119  . haloperidol decanoate (HALDOL DECANOATE) 100 MG/ML injection 100 mg  100 mg Intramuscular Q28 days Pucilowska, Jolanta B, MD   100 mg at 06/30/17 1610  . hydrOXYzine (ATARAX/VISTARIL) tablet 25 mg  25 mg Oral TID PRN Jimmy Footman, MD   25 mg at 06/29/17 2145  . magnesium hydroxide (MILK OF MAGNESIA) suspension 30 mL  30 mL Oral Daily PRN Jimmy Footman, MD      . traZODone (DESYREL) tablet 100 mg  100 mg Oral QHS Pucilowska, Jolanta B, MD   100 mg at 06/30/17 2119    Lab Results:  No results found for this or any previous visit (from the past 48 hour(s)).  Blood Alcohol level:  Lab Results  Component Value Date   ETH <5 06/25/2017   ETH <5 03/23/2017    Metabolic Disorder Labs: Lab Results  Component Value Date   HGBA1C 5.2 03/26/2017   MPG 103 03/26/2017   MPG 103 11/03/2016   Lab Results  Component Value Date   PROLACTIN 1.7 (L) 01/27/2016   Lab Results  Component Value Date   CHOL 183 06/27/2017   TRIG 99 06/27/2017   HDL 47 06/27/2017   CHOLHDL 3.9 06/27/2017   VLDL 20 06/27/2017   LDLCALC  116 (H) 06/27/2017   LDLCALC 128 (H) 03/26/2017    Physical Findings: AIMS: Facial and Oral Movements Muscles of Facial Expression: None, normal Lips and Perioral Area: None, normal Jaw: None, normal Tongue: None, normal,Extremity Movements Upper (arms, wrists, hands, fingers): None, normal Lower (legs, knees, ankles, toes): None, normal, Trunk Movements Neck, shoulders, hips: None, normal, Overall Severity Severity of abnormal movements (highest score from questions above): None, normal Incapacitation due to abnormal movements: None, normal Edwin Montes's awareness of abnormal movements (rate only Edwin Montes's report): No Awareness, Dental Status Current problems with teeth and/or dentures?: No Does Edwin Montes usually wear dentures?: No  CIWA:  CIWA-Ar Total: 0 COWS:  COWS Total Score: 0  Musculoskeletal: Strength & Muscle Tone: within normal limits Gait & Station: normal Edwin Montes leans: N/A  Psychiatric Specialty  Exam: Physical Exam  Nursing note and vitals reviewed. Psychiatric: His affect is blunt. His affect is not inappropriate. His speech is delayed. He is slowed. He is not withdrawn and not actively hallucinating. Thought content is not paranoid. Cognition and memory are impaired. He does not express inappropriate judgment.    Review of Systems  Psychiatric/Behavioral: Negative for hallucinations. The Edwin Montes has insomnia.   All other systems reviewed and are negative.   Blood pressure 115/68, pulse 66, temperature 98 F (36.7 C), temperature source Oral, resp. rate 18, height 6\' 2"  (1.88 m), weight 94.3 kg (208 lb), SpO2 99 %.Body mass index is 26.71 kg/m.  General Appearance: Casual  Eye Contact:  Fair  Speech:  Slow and latency and poverty of speech.  Volume:  Decreased  Mood:  Depressed  Affect:  Blunt  Thought Process:  Disorganized and Descriptions of Associations: Loose  Orientation:  Full (Time, Place, and Person)  Thought Content:  Delusions, Hallucinations:  Auditory, Paranoid Ideation and Rumination  Suicidal Thoughts:  No  Homicidal Thoughts:  No  Memory:  Immediate;   Fair Recent;   Fair Remote;   Fair  Judgement:  Poor  Insight:  Lacking  Psychomotor Activity:  Psychomotor Retardation  Concentration:  Concentration: Poor and Attention Span: Poor  Recall:  Poor  Fund of Knowledge:  Fair  Language:  Fair  Akathisia:  No  Handed:  Right  AIMS (if indicated):     Assets:  Communication Skills Desire for Improvement Financial Resources/Insurance Housing Physical Health Resilience Social Support  ADL's:  Intact  Cognition:  WNL  Sleep:  Number of Hours: 7.45     Treatment Plan Summary: Daily contact with Edwin Montes to assess and evaluate symptoms and progress in treatment and Medication management   Edwin Montes is a 31 year old male with schizophrenia admitted for anxiety and hallucinations due to medication noncompliance.   1. Schizophrenia. Continue Abilify 20 mg a day along with Abilify injectable 400 mg every 30 days. Spoke with [name] at [employer]. His next injection is due on 07/03/2017. I added 10 mg Haldol nightly. I will order Haldol decanoate injection for tomorrow.    2. Anxiety. We restarted Luvox. He is taking 100 mg nightly.  3. Insomnia. Trazodone is available.   4. Cannabis use disorder. Edwin Montes has received education about the negative effects of cannabis in schizophrenia  5. Tobacco use disorder. Nicotine patch was available.   6. Metabolic syndrome monitoring. Hemoglobin A1c, lipid panel and TSH are normal.   7. EKG. Normal sinus rhythm. QTc 394.  8. Disposition. He will be discharged back to his apartment, at least temporarily. He will follow up with [facility] psychiatrist, peer support and day program.  He is taking care of his hygiene adequately. Lucid although doesn't talk much. Does not appear to be showing acute problems with safety or behavior. Reassured him that if he talks to his treatment team he may be  able to get out of the hospital soon. Mordecai Rasmussen, MD 07/01/2017, 2:28 PM

## 2017-07-01 NOTE — [facility] Group Notes (Signed)
[facility] LCSW Group Therapy  07/01/2017 2:44 PM  Type of Therapy:  Group Therapy  Participation Level:  Minimal  Participation Quality:  Appropriate  Affect:  Flat  Cognitive:  Lacking  Insight:  Limited  Engagement in Therapy:  Lacking and Limited  Modes of Intervention:  Activity, Clarification, Discussion, Education, Socialization and Support  Summary of Progress/Problems: Coping Skills: Patients defined and discussed healthy coping skills. Patients identified healthy coping skills they would like to try during hospitalization and after discharge. CSW offered insight to varying coping skills that may have been new to patients such as practicing mindfulness. Patient still has a delayed response when engaging with peers and CSW. Patient was able to participate in the mediation group activity and stated he felt more relaxed after the activity.    Edwin Montes G. Garnette Czech MSW, LCSWA 07/01/2017, 2:52 PM

## 2017-07-01 NOTE — Plan of Care (Signed)
Problem: Education: Goal: Ability to state activities that reduce stress will improve Outcome: Not Progressing Patient has not verbalized activities that will reduce stress.  Problem: Self-Concept: Goal: Level of anxiety will decrease Outcome: Progressing Patient has verbalized a decrease in anxiety.  Problem: Health Behavior/Discharge Planning: Goal: Ability to make decisions will improve Outcome: Not Progressing Patient has not displayed the ability to make any decisions will improve.  Problem: Health Behavior/Discharge Planning: Goal: Compliance with treatment plan for underlying cause of condition will improve Outcome: Progressing Patient is compliant with therapeutic regimen  Problem: Safety: Goal: Ability to remain free from injury will improve Outcome: Progressing Patient remains free from injury.

## 2017-07-01 NOTE — [facility] Group Notes (Signed)
[facility] Group Notes:  (Nursing/MHT/Case Management/Adjunct)  Date:  07/01/2017  Time:  9:13 PM  Type of Therapy:  Group Therapy  Participation Level:  Did Not Attend  Participation Quality: Summary of Progress/Problems:  Edwin Montes 07/01/2017, 9:13 PM

## 2017-07-01 NOTE — Plan of Care (Signed)
Problem: Coping: Goal: Ability to identify and develop effective coping behavior will improve Outcome: Progressing Patient attended wrap up group and had snack in the dayroom.  He was pleasant with minimal interaction during medication pass.  Problem: Activity: Goal: Interest or engagement in leisure activities will improve Outcome: Progressing Patient was observed on initial rounds, sitting in his room.  On contact, he verbalized wanting to talk to the doctor about discharge.  He denies having uncomfortable thoughts and did not admit to hearing voices.  He does contract for safety.  Interaction with peers is minimal.

## 2017-07-01 NOTE — Progress Notes (Signed)
Patient is alert and oriented to person. Skin is warm, dry and intact. No limitations to all four extremities noted. Patient  denies SI/HI AVH at this time. Medication compliant, has flat affect with sad mood.  Patient was observed ambulating in hall during the shift with a steady gait. Attends meals and group with selective peer interaction noted. Milieu remains therapeutic. Patient will be monitored and physician notified of any acute changes.

## 2017-07-02 MED ORDER — ARIPIPRAZOLE 20 MG PO TABS: 20.0000 mg | ORAL_TABLET | Freq: Every day | ORAL | 1 refills | Status: AC

## 2017-07-02 MED ORDER — FLUVOXAMINE MALEATE 100 MG PO TABS: 100.0000 mg | ORAL_TABLET | Freq: Every day | ORAL | 1 refills | Status: AC

## 2017-07-02 MED ORDER — TRAZODONE HCL 100 MG PO TABS: 100.0000 mg | ORAL_TABLET | Freq: Every day | ORAL | 1 refills | Status: AC

## 2017-07-02 MED ORDER — HALOPERIDOL 10 MG PO TABS: 10.0000 mg | ORAL_TABLET | Freq: Every day | ORAL | 1 refills | Status: AC

## 2017-07-02 MED ORDER — ARIPIPRAZOLE ER 400 MG IM SUSR: 400.0000 mg | INTRAMUSCULAR | 1 refills | Status: AC

## 2017-07-02 MED ORDER — HALOPERIDOL DECANOATE 100 MG/ML IM SOLN: 100.0000 mg | INTRAMUSCULAR | 1 refills | Status: AC

## 2017-07-02 MED ORDER — FLUVOXAMINE MALEATE 50 MG PO TABS
100.0000 mg | ORAL_TABLET | Freq: Every day | ORAL | Status: DC
  Administered 2017-07-02: 100 mg via ORAL
  Filled 2017-07-02: qty 2

## 2017-07-02 NOTE — Plan of Care (Signed)
Problem: Coping: Goal: Ability to identify and develop effective coping behavior will improve Outcome: Not Progressing Patient spent most of this evening sitting in his room.  He came to the medication room when directed and was observed to be mildly tense but denied anxiety.  He displays poor eye contact.  His focus is on wanting discharge .  He is not observed responding to unseen others.  Problem: Safety: Goal: Ability to disclose and discuss suicidal ideas will improve Outcome: Progressing Patient contracts for safety.

## 2017-07-02 NOTE — Progress Notes (Signed)
Recreation Therapy Notes  Date: 08.20.18 Time: 9:30 am Location: Craft Room  Group Topic: Self-expression  Goal Area(s) Addresses:  Patient will identify one color per emotion listed on wheel. Patient will verbalize benefit of using art as a means of self-expression. Patient will verbalize one emotion experienced during session. Patient will be educated on other forms of self-expression.  Behavioral Response: Did not attend  Intervention: Emotion Wheel  Activity: Patients were given an Emotion Wheel worksheet and were instructed to pick a color for each emotion listed.   Education: LRT educated patients on other forms of self-expression.  Education Outcome: Patient did not attend group.  Clinical Observations/Feedback: Patient did not attend group.  Jacquelynn Cree, LRT/CTRS 07/02/2017 10:06 AM
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
## 2017-07-02 NOTE — Plan of Care (Signed)
Problem: Safety: Goal: Ability to remain free from injury will improve Outcome: Progressing Pt remains safe while in hospital injury free.    

## 2017-07-02 NOTE — Plan of Care (Signed)
Problem: Self-Concept: Goal: Level of anxiety will decrease Outcome: Progressing No complaints or visible signs of anxiety noted. Will continue to monitor.   Problem: Activity: Goal: Interest or engagement in leisure activities will improve Outcome: Progressing Patient attended PM wrap up group with slight encouragement. Patient unable to identify leisure activities for coping with anxiety post discharge at this time. Planning encouraged. Coping strategies reviewed.   Problem: Education: Goal: Knowledge of the prescribed therapeutic regimen will improve Reviewed HS medications and indications. Patient's knowledge is vague. Reinforcement needed.   Problem: Health Behavior/Discharge Planning: Goal: Compliance with therapeutic regimen will improve Patient med compliant and participated in group activity this evening.   Problem: Health Behavior/Discharge Planning: Goal: Identification of resources available to assist in meeting health care needs will improve Patient is currently unable to identify inpatient or community resources at this time. Hospital resources reviewed. Patient confused when questioned on discharge. Initially stated that he had no plans set for discharge, but then verbalized understanding of pending discharge set for tomorrow.

## 2017-07-02 NOTE — [facility] Group Notes (Signed)
[facility] Group Notes:  (Nursing/MHT/Case Management/Adjunct)  Date:  07/02/2017  Time:  9:33 PM  Type of Therapy:  Group Therapy  Participation Level:  Active  Participation Quality:  Appropriate  Affect:  Appropriate  Cognitive:  Alert  Insight:  Good  Engagement in Group:  Lacking  Modes of Intervention:  Support  Summary of Progress/Problems:  Edwin Montes 07/02/2017, 9:33 PM

## 2017-07-02 NOTE — Tx Team (Addendum)
Interdisciplinary Treatment and Diagnostic Plan Update  07/02/2017 Time of Session: 1058 Edwin Montes MRN: [id number]  Principal Diagnosis: Undifferentiated schizophrenia [facility])  Secondary Diagnoses: Principal Problem:   Undifferentiated schizophrenia (HCC) Active Problems:   Tobacco use disorder   Noncompliance   Cannabis use disorder, moderate, dependence (HCC)   Psychosis   Current Medications:  Current Facility-Administered Medications  Medication Dose Route Frequency Provider Last Rate Last Dose  . acetaminophen (TYLENOL) tablet 650 mg  650 mg Oral Q6H PRN Jimmy Footman, MD      . alum & mag hydroxide-simeth (MAALOX/MYLANTA) 200-200-20 MG/5ML suspension 30 mL  30 mL Oral Q4H PRN Jimmy Footman, MD      . ARIPiprazole (ABILIFY) tablet 20 mg  20 mg Oral Daily Pucilowska, Jolanta B, MD   20 mg at 07/02/17 0804  . [START ON 07/03/2017] ARIPiprazole ER SRER 400 mg  400 mg Intramuscular Q28 days Pucilowska, Jolanta B, MD      . fluvoxaMINE (LUVOX) tablet 100 mg  100 mg Oral QHS Pucilowska, Jolanta B, MD      . haloperidol (HALDOL) tablet 10 mg  10 mg Oral QHS Pucilowska, Jolanta B, MD   10 mg at 07/01/17 2113  . haloperidol decanoate (HALDOL DECANOATE) 100 MG/ML injection 100 mg  100 mg Intramuscular Q28 days Pucilowska, Jolanta B, MD   100 mg at 06/30/17 9562  . hydrOXYzine (ATARAX/VISTARIL) tablet 25 mg  25 mg Oral TID PRN Jimmy Footman, MD   25 mg at 06/29/17 2145  . magnesium hydroxide (MILK OF MAGNESIA) suspension 30 mL  30 mL Oral Daily PRN Jimmy Footman, MD      . traZODone (DESYREL) tablet 100 mg  100 mg Oral QHS Pucilowska, Jolanta B, MD   100 mg at 07/01/17 2139   PTA Medications: Prescriptions Prior to Admission  Medication Sig Dispense Refill Last Dose  . ARIPiprazole (ABILIFY) 20 MG tablet Take 1 tablet (20 mg total) by mouth daily. 30 tablet 1 06/18/2017  . ARIPiprazole ER 400 MG SUSR Inject 400 mg into the muscle  every 28 (twenty-eight) days. Due on January 19 1 each 1 05/30/2017  . fluvoxaMINE (LUVOX) 100 MG tablet Take 1 tablet (100 mg total) by mouth at bedtime. 30 tablet 1 06/18/2017    Patient Stressors: Medication change or noncompliance  Patient Strengths: Average or above average intelligence Capable of independent living Physical Health Special hobby/interest Supportive family/friends  Treatment Modalities: Medication Management, Group therapy, Case management,  1 to 1 session with clinician, Psychoeducation, Recreational therapy.   Physician Treatment Plan for Primary Diagnosis: Undifferentiated schizophrenia (HCC) Long Term Goal(s): Improvement in symptoms so as ready for discharge Improvement in symptoms so as ready for discharge   Short Term Goals: Ability to identify changes in lifestyle to reduce recurrence of condition will improve Ability to verbalize feelings will improve Ability to disclose and discuss suicidal ideas Ability to demonstrate self-control will improve Ability to identify and develop effective coping behaviors will improve Ability to maintain clinical measurements within normal limits will improve Compliance with prescribed medications will improve Ability to identify triggers associated with substance abuse/mental health issues will improve Ability to identify changes in lifestyle to reduce recurrence of condition will improve Ability to disclose and discuss suicidal ideas Compliance with prescribed medications will improve Ability to identify triggers associated with substance abuse/mental health issues will improve  Medication Management: Evaluate patient's response, side effects, and tolerance of medication regimen.  Therapeutic Interventions: 1 to 1 sessions, Unit Group sessions and Medication  administration.  Evaluation of Outcomes: Progressing  Physician Treatment Plan for Secondary Diagnosis: Principal Problem:   Undifferentiated schizophrenia  (HCC) Active Problems:   Tobacco use disorder   Noncompliance   Cannabis use disorder, moderate, dependence (HCC)   Psychosis  Long Term Goal(s): Improvement in symptoms so as ready for discharge Improvement in symptoms so as ready for discharge   Short Term Goals: Ability to identify changes in lifestyle to reduce recurrence of condition will improve Ability to verbalize feelings will improve Ability to disclose and discuss suicidal ideas Ability to demonstrate self-control will improve Ability to identify and develop effective coping behaviors will improve Ability to maintain clinical measurements within normal limits will improve Compliance with prescribed medications will improve Ability to identify triggers associated with substance abuse/mental health issues will improve Ability to identify changes in lifestyle to reduce recurrence of condition will improve Ability to disclose and discuss suicidal ideas Compliance with prescribed medications will improve Ability to identify triggers associated with substance abuse/mental health issues will improve     Medication Management: Evaluate patient's response, side effects, and tolerance of medication regimen.  Therapeutic Interventions: 1 to 1 sessions, Unit Group sessions and Medication administration.  Evaluation of Outcomes: Progressing   RN Treatment Plan for Primary Diagnosis: Undifferentiated schizophrenia (HCC) Long Term Goal(s): Knowledge of disease and therapeutic regimen to maintain health will improve  Short Term Goals: Ability to identify and develop effective coping behaviors will improve and Compliance with prescribed medications will improve  Medication Management: RN will administer medications as ordered by provider, will assess and evaluate patient's response and provide education to patient for prescribed medication. RN will report any adverse and/or side effects to prescribing provider.  Therapeutic  Interventions: 1 on 1 counseling sessions, Psychoeducation, Medication administration, Evaluate responses to treatment, Monitor vital signs and CBGs as ordered, Perform/monitor CIWA, COWS, AIMS and Fall Risk screenings as ordered, Perform wound care treatments as ordered.  Evaluation of Outcomes: Progressing   LCSW Treatment Plan for Primary Diagnosis: Undifferentiated schizophrenia (HCC) Long Term Goal(s): Safe transition to appropriate next level of care at discharge, Engage patient in therapeutic group addressing interpersonal concerns.  Short Term Goals: Engage patient in aftercare planning with referrals and resources and Increase skills for wellness and recovery  Therapeutic Interventions: Assess for all discharge needs, 1 to 1 time with Social worker, Explore available resources and support systems, Assess for adequacy in community support network, Educate family and significant other(s) on suicide prevention, Complete Psychosocial Assessment, Interpersonal group therapy.  Evaluation of Outcomes: Progressing    Recreational Therapy Treatment Plan for Primary Diagnosis: Undifferentiated schizophrenia (HCC) Long Term Goal(s): Patient will participate in recreation therapy treatment in at least 2 group sessions without prompting from LRT  Short Term Goals: Increase self-esteem, Increase time management skills  Treatment Modalities: Group Therapy and Individual Treatment Sessions  Therapeutic Interventions: Psychoeducation  Evaluation of Outcomes: Progressing   Progress in Treatment: Attending groups: Yes Participating in groups: Yes Taking medication as prescribed: Yes. Toleration medication: Yes. Family/Significant other contact made: Yes, individual(s) contacted:  mother Patient understands diagnosis: Yes. Discussing patient identified problems/goals with staff: Yes. Medical problems stabilized or resolved: Yes. Denies suicidal/homicidal ideation: Yes. Issues/concerns per  patient self-inventory: No. Other: none  New problem(s) identified: No, Describe:  none  New Short Term/Long Term Goal(s):Pt goal: Pt unable to verbalize a goal.  Discharge Plan or Barriers: Pt will continue services at [facility].  Reason for Continuation of Hospitalization: Hallucinations Medication stabilization  Estimated Length of Stay:1 day  Attendees: Patient: 07/02/2017   Physician: Dr. Jennet Maduro, MD 07/02/2017   Nursing:  07/02/2017   RN Care Manager: 07/02/2017   Social Worker: Daleen Squibb, LCSW 07/02/2017   Recreational Therapist: Princella Ion, LRT/CTRS  07/02/2017   Other:  07/02/2017   Other:  07/02/2017   Other: 07/02/2017         Scribe for Treatment Team: Lorri Frederick, LCSW 07/02/2017 11:19 AM

## 2017-07-02 NOTE — Plan of Care (Signed)
Problem: [facility] Participation in Recreation Therapeutic Interventions Goal: STG-Patient will demonstrate improved self esteem by identif STG: Self-Esteem - Within 4 treatment sessions, patient will verbalize at least 5 positive affirmation statements in each of 2 treatment sessions to increase self-esteem.  Outcome: Progressing Treatment Session 1; Completed 1 out of 2: At approximately 2:00 pm, LRT met with patient in craft room. Patient verbalized 5 positive affirmation statements. Patient reported it felt "good". LRT encouraged patient to continue saying positive affirmation statements.  Leonette Monarch, LRT/CTRS 08.20.18 3:38 pm Goal: STG-Other Recreation Therapy Goal (Specify) STG: Time Management - Within 4 treatment sessions, patient will verbalize understanding of scheduling in each of 2 treatment sessions to increase time management skills.  Outcome: Progressing Treatment Session 1; Completed 1 out of 2: At approximately 2:00 pm, LRT met with patient in craft room. LRT educated and provided patient with blank schedules. Patient verbalized understanding of scheduling. LRT encouraged patient to use the schedules to help him manage his time better.  Leonette Monarch, LRT/CTRS  08.20.18 3:40 pm

## 2017-07-02 NOTE — [facility] Group Notes (Signed)
[facility] LCSW Group Therapy Note  Date/Time: 07/02/17, 1300  Type of Therapy and Topic:  Group Therapy:  Overcoming Obstacles  Participation Level:  moderate  Description of Group:    In this group patients will be encouraged to explore what they see as obstacles to their own wellness and recovery. They will be guided to discuss their thoughts, feelings, and behaviors related to these obstacles. The group will process together ways to cope with barriers, with attention given to specific choices patients can make. Each patient will be challenged to identify changes they are motivated to make in order to overcome their obstacles. This group will be process-oriented, with patients participating in exploration of their own experiences as well as giving and receiving support and challenge from other group members.  Therapeutic Goals: 1. Patient will identify personal and current obstacles as they relate to admission. 2. Patient will identify barriers that currently interfere with their wellness or overcoming obstacles.  3. Patient will identify feelings, thought process and behaviors related to these barriers. 4. Patient will identify two changes they are willing to make to overcome these obstacles:    Summary of Patient Progress: Pt again only responds to cSW questions and also has difficulty formulating responses to those questions.  Pt said several vague things about "how I'm doing" in response to CSW questions.  Still appears to be pt baseline behavior, however.      Therapeutic Modalities:   Cognitive Behavioral Therapy Solution Focused Therapy Motivational Interviewing Relapse Prevention Therapy  Daleen Squibb, LCSW

## 2017-07-02 NOTE — Progress Notes (Signed)
Pt calm and cooperative. He denies current SI, HI, a/v hallucinations. Pt medication and meal compliant. Will continue to monitor.

## 2017-07-02 NOTE — Progress Notes (Signed)
[facility] MD Progress Note  07/02/2017 8:28 AM Edwin Montes  MRN:  [id number]  Subjective:  Edwin Montes is a 31 year old male with a history of schizophrenia admitted floridly psychotic in the contaxt of medication noncompliance. Restarted on Abilify, Zyprexa and Luvox.  03/28/2017. [name] feels better. He is more able to participate in conversation, does seems confused and answers questions without delay. Spoke with [name] at [facility] to confirm that the patient received Abilify maintena injection last month. Next injection on 8/21. Oral Abilify was lowered to 10 mg and Luvox discontinued by his primary psychiatrist. The patient agreed to try Haldol and possibly Haldol decanoate.  03/29/2017. [name] is actively hallucinating unable to engage in conversation. He has no complaints and denies any symptoms but when pressed admits to hearing voices. SW spoke with his peer support and housing supervisor. There are worries that the patient is functioning badly in independent setting. He is compliant with Abilify maintenna but not oral medication. I gave him oral Haldol last night with no side effects. I will give another oral dose tonight and Haldol decanoate injection tomorrow. He does have OCD but no mood component  Follow-up for Saturday, August 18. Patient seen. He has no complaints. He is neatly dressed but his affect is flat. He says that he no longer feels his thoughts are racing. Denies any suicidal or homicidal ideation. Seems preoccupied but was able to answer basic questions..  Follow-up for Sunday the 19th. Patient has no new complaints. Affect is flat but he reports that his thoughts are feeling normal. Denies suicidal thoughts. He is requesting discharge soon.  07/02/2017. [name] seems better today. He is still hallucinating but able to engage in a conversation more easily. He tolerates medications well. His Abilify shot is due tomorrow. He was visited by peer support yesterday and assured that  he will be returning to his apartment. Sleep has improved. There is no group participation.  Per nursing: Problem: Coping: Goal: Ability to identify and develop effective coping behavior will improve Outcome: Not Progressing Patient spent most of this evening sitting in his room.  He came to the medication room when directed and was observed to be mildly tense but denied anxiety.  He displays poor eye contact.  His focus is on wanting discharge .  He is not observed responding to unseen others.  Problem: Safety: Goal: Ability to disclose and discuss suicidal ideas will improve Outcome: Progressing Patient contracts for safety.   Principal Problem: Undifferentiated schizophrenia (HCC) Diagnosis:   Patient Active Problem List   Diagnosis Date Noted  . Psychosis [F29] 06/27/2017  . Undifferentiated schizophrenia (HCC) [F20.3] 03/24/2017  . 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 Panic attack [F41.0] 06/23/2016  . Noncompliance [Z91.19] 02/29/2016  . Tobacco use disorder [F17.200] 01/27/2016   Total Time spent with patient: 30 minutes  Past Psychiatric History: Schizophrenia.  Past Medical History:  Past Medical History:  Diagnosis Date  . Anxiety   . Schizophrenia [facility])     Past Surgical History:  Procedure Laterality Date  . NO PAST SURGERIES     Family History: History reviewed. No pertinent family history. Family Psychiatric  History: See H&P. Social History:  History  Alcohol Use No     History  Drug Use  . Types: Other-see comments    Comment: Cannibis    Social History   Social History  . Marital status: Single    Spouse name: N/A  . Number of children:  N/A  . Years of education: N/A   Social History Main Topics  . Smoking status: Current Some Day Smoker    Packs/day: 0.50    Years: 1.00    Types: Cigarettes  . Smokeless tobacco: Never Used  . Alcohol use No  . Drug use: Yes    Types: Other-see comments      Comment: Cannibis  . Sexual activity: Not Currently   Other Topics Concern  . None   Social History Narrative  . None   Additional Social History:    Pain Medications:  (none) Prescriptions: see PTA History of alcohol / drug use?: Yes (cannibis use twice a month) Withdrawal Symptoms: (S) Other (Comment) (none)                    Sleep: Fair  Appetite:  Fair  Current Medications: Current Facility-Administered Medications  Medication Dose Route Frequency Provider Last Rate Last Dose  . acetaminophen (TYLENOL) tablet 650 mg  650 mg Oral Q6H PRN Jimmy Footman, MD      . alum & mag hydroxide-simeth (MAALOX/MYLANTA) 200-200-20 MG/5ML suspension 30 mL  30 mL Oral Q4H PRN Jimmy Footman, MD      . ARIPiprazole (ABILIFY) tablet 20 mg  20 mg Oral Daily Amyla Heffner B, MD   20 mg at 07/02/17 0804  . [START ON 07/03/2017] ARIPiprazole ER SRER 400 mg  400 mg Intramuscular Q28 days Jamyron Redd B, MD      . fluvoxaMINE (LUVOX) tablet 100 mg  100 mg Oral QHS Burtis Imhoff B, MD      . haloperidol (HALDOL) tablet 10 mg  10 mg Oral QHS Reagan Behlke B, MD   10 mg at 07/01/17 2113  . haloperidol decanoate (HALDOL DECANOATE) 100 MG/ML injection 100 mg  100 mg Intramuscular Q28 days Elanna Bert B, MD   100 mg at 06/30/17 3151  . hydrOXYzine (ATARAX/VISTARIL) tablet 25 mg  25 mg Oral TID PRN Jimmy Footman, MD   25 mg at 06/29/17 2145  . magnesium hydroxide (MILK OF MAGNESIA) suspension 30 mL  30 mL Oral Daily PRN Jimmy Footman, MD      . traZODone (DESYREL) tablet 100 mg  100 mg Oral QHS Mykel Sponaugle B, MD   100 mg at 07/01/17 2139    Lab Results:  No results found for this or any previous visit (from the past 48 hour(s)).  Blood Alcohol level:  Lab Results  Component Value Date   ETH <5 06/25/2017   ETH <5 03/23/2017    Metabolic Disorder Labs: Lab Results  Component Value Date   HGBA1C 5.2  03/26/2017   MPG 103 03/26/2017   MPG 103 11/03/2016   Lab Results  Component Value Date   PROLACTIN 1.7 (L) 01/27/2016   Lab Results  Component Value Date   CHOL 183 06/27/2017   TRIG 99 06/27/2017   HDL 47 06/27/2017   CHOLHDL 3.9 06/27/2017   VLDL 20 06/27/2017   LDLCALC 116 (H) 06/27/2017   LDLCALC 128 (H) 03/26/2017    Physical Findings: AIMS: Facial and Oral Movements Muscles of Facial Expression: None, normal Lips and Perioral Area: None, normal Jaw: None, normal Tongue: None, normal,Extremity Movements Upper (arms, wrists, hands, fingers): None, normal Lower (legs, knees, ankles, toes): None, normal, Trunk Movements Neck, shoulders, hips: None, normal, Overall Severity Severity of abnormal movements (highest score from questions above): None, normal Incapacitation due to abnormal movements: None, normal Patient's awareness of abnormal movements (rate only patient's report):  No Awareness, Dental Status Current problems with teeth and/or dentures?: No Does patient usually wear dentures?: No  CIWA:  CIWA-Ar Total: 0 COWS:  COWS Total Score: 0  Musculoskeletal: Strength & Muscle Tone: within normal limits Gait & Station: normal Patient leans: N/A  Psychiatric Specialty Exam: Physical Exam  Nursing note and vitals reviewed. Psychiatric: His speech is normal. Thought content normal. His affect is blunt. He is withdrawn and actively hallucinating. Cognition and memory are normal. He does not express inappropriate judgment.    Review of Systems  Psychiatric/Behavioral: Positive for hallucinations. The patient has insomnia.   All other systems reviewed and are negative.   Blood pressure 94/69, pulse 79, temperature (!) 97.3 F (36.3 C), temperature source Oral, resp. rate 18, height 6\' 2"  (1.88 m), weight 94.3 kg (208 lb), SpO2 98 %.Body mass index is 26.71 kg/m.  General Appearance: Casual  Eye Contact:  Fair  Speech:  Slow and latency and poverty of speech.   Volume:  Decreased  Mood:  Depressed  Affect:  Blunt  Thought Process:  Disorganized and Descriptions of Associations: Loose  Orientation:  Full (Time, Place, and Person)  Thought Content:  Delusions, Hallucinations: Auditory, Paranoid Ideation and Rumination  Suicidal Thoughts:  No  Homicidal Thoughts:  No  Memory:  Immediate;   Fair Recent;   Fair Remote;   Fair  Judgement:  Poor  Insight:  Lacking  Psychomotor Activity:  Psychomotor Retardation  Concentration:  Concentration: Poor and Attention Span: Poor  Recall:  Poor  Fund of Knowledge:  Fair  Language:  Fair  Akathisia:  No  Handed:  Right  AIMS (if indicated):     Assets:  Communication Skills Desire for Improvement Financial Resources/Insurance Housing Physical Health Resilience Social Support  ADL's:  Intact  Cognition:  WNL  Sleep:  Number of Hours: 5.3     Treatment Plan Summary: Daily contact with patient to assess and evaluate symptoms and progress in treatment and Medication management   Mr. Chapdelaine is a 31 year old male with schizophrenia admitted for anxiety and hallucinations due to medication noncompliance.   1. Schizophrenia. He is on Abilify maintena due on 8/21 and Haldol decanoate injections given on 8/18. He is still on 20 mg of oral Abilify and 10 mg Haldol.    2. Anxiety. We restarted Luvox. He is taking 100 mg nightly.  3. Insomnia. Trazodone is available.   4. Cannabis use disorder. Patient has received education about the negative effects of cannabis in schizophrenia  5. Tobacco use disorder. Nicotine patch was available.   6. Metabolic syndrome monitoring. Hemoglobin A1c, lipid panel and TSH are normal.   7. EKG. Normal sinus rhythm. QTc 394.  8. Disposition. He will be discharged back to his apartment, at least temporarily. He will follow up with [facility] psychiatrist, peer support and day program.  Kristine Linea, MD 07/02/2017, 8:28 AM

## 2017-07-03 NOTE — Progress Notes (Signed)
Denies SI/HI/AVH.  Affect somewhat constricted.  Less fidgety.   Responds appropriately with no delay.  Support and encouragement offered.   Discharge instructions reviewed, verbalized understanding.

## 2017-07-03 NOTE — Progress Notes (Signed)
Patient discharged on above date and time. Patient picked up by his peer support worker. Mood pleasant, no complaints voiced. Paperwork handed to patient's peer support worker. Patient received back his personal items and prescriptions.

## 2017-07-03 NOTE — Discharge Summary (Signed)
Physician Discharge Summary Note  Patient:  Edwin Montes is an 31 y.o., male MRN:  [id number] DOB:  1986/03/25 Patient phone:  [phone] (home)  Patient address:   [address] [place] [address],  Total Time spent with patient: 30 minutes  Date of Admission:  06/26/2017 Date of Discharge: 07/03/2017  Reason for Admission:  Psychotic break.  Identifying data. [name] is a 31 year old male with history of schizophrenia.  Chief complaint. "I have racing thoughts again."  History of present illness. Information was obtained from the patient and the chart. The patient was brought to the Emergency Room by EMS complaining of anxiety, shortness of breath, racing thoughts and paranoia. He has been off his medications for several days. It is unclear if he continues on Abilify maintena injections as planned. He as a long history of schizophrenia diagnosed about 10 years ago. He has been doing exceedingly well on Abilify maintena injections and graduated from the group home into his own supervised apartment almost one year ago. He sees Dr. Georjean Mode at [facility] regularly and works with peer support team. For several days now, the patient has been feeling increasingly anxious, with racing disorganized thoughts and insomnia. He denies suicidal ideation, intentions or plans but feels confused and uncertain. He also reports auditory hallucinations but no commands. He appears to respond to internal stimuli during the interview and treatment team meeting. His latency of response is striking, takes several minutes to answer the question correctly. Following our interview, he returned to my office several times asking about discharge but was ok to stay till next week. He does not reports paranoia and heightened anxiety but is not precise. In the past, he complained of symptoms of generalized anxiety disorder, infrequent panic attacks and some symptoms suggestive of OCD with excessive worries, excessive  cleaning and organizing. He was positive for cannabis on admission.   Past psychiatric history. The patient is not a good historian. Apparently he was diagnosed at the age of 63 and was placed in a group home at that time. He has been a resident of multiple group homes up until recently when he "graduated" and was allowed to live in his own apartment. He reports being hospitalized many times in the past. He denies ever attempting suicide. He believes that he was given diagnosis of bipolar, schizoaffective disorder, schizophrenia, and anxiety. He has been tried on multiple medications including Risperdal, Invega, Zyprexa, Latuda, Abilify and Saphris. He does not remember ever being on Seroquel or Geodon. He was tried on Depakote but could not tolerate it He has never taken lithium or Tegretol. In the past he was treated with SSRIs for anxiety. He was given Klonopin in the past.  Family psychiatric history. Grandmother with depression.  Social history. He grew up in [place], [place]. He graduated from high school. He denies any history of abuse. He has been a resident of multiple group homes. He is now living in a supervised apartment in [place]. He sees Dr. Georjean Mode at [facility] for mental health and Dr. Lacie Scotts as his primary doctor. He goes to a day program at the [facility] and works with peer support team.  Principal Problem: Undifferentiated schizophrenia [facility]) Discharge Diagnoses: Patient Active Problem List   Diagnosis Date Noted  . Undifferentiated schizophrenia (HCC) [F20.3] 03/24/2017  . 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 Panic attack [F41.0] 06/23/2016  . Noncompliance [Z91.19] 02/29/2016  . Tobacco use disorder [F17.200] 01/27/2016    Past  Medical History:  Past Medical History:  Diagnosis Date  . Anxiety   . Schizophrenia [facility])     Past Surgical History:  Procedure Laterality Date  . NO PAST SURGERIES     Family History: History  reviewed. No pertinent family history.  Social History:  History  Alcohol Use No     History  Drug Use  . Types: Other-see comments    Comment: Cannibis    Social History   Social History  . Marital status: Single    Spouse name: N/A  . Number of children: N/A  . Years of education: N/A   Social History Main Topics  . Smoking status: Current Some Day Smoker    Packs/day: 0.50    Years: 1.00    Types: Cigarettes  . Smokeless tobacco: Never Used  . Alcohol use No  . Drug use: Yes    Types: Other-see comments     Comment: Cannibis  . Sexual activity: Not Currently   Other Topics Concern  . None   Social History Narrative  . None    Hospital Course:   [name] is a 31 year old male with schizophrenia admitted for anxiety and hallucinations due to medication noncompliance.   1. Schizophrenia. We started oral Abilify and Haldol for psychosis. He received Abilify maintena on 8/21 and Haldol decanoate injection on 8/18.     2. Anxiety. We restarted Luvox.   3. Insomnia. Trazodone was available.   4. Cannabis use disorder. Patient has received education about the negative effects of cannabis in schizophrenia  5. Tobacco use disorder. Nicotine patch was available.   6. Metabolic syndrome monitoring. Hemoglobin A1c, lipid panel and TSH are normal.   7. EKG. Normal sinus rhythm. QTc 394.  8. Disposition. He was discharged back to his apartment, at least temporarily. He will follow up with [facility] psychiatrist, peer support and day program.  Physical Findings: AIMS: Facial and Oral Movements Muscles of Facial Expression: None, normal Lips and Perioral Area: None, normal Jaw: None, normal Tongue: None, normal,Extremity Movements Upper (arms, wrists, hands, fingers): None, normal Lower (legs, knees, ankles, toes): None, normal, Trunk Movements Neck, shoulders, hips: None, normal, Overall Severity Severity of abnormal movements (highest score from questions  above): None, normal Incapacitation due to abnormal movements: None, normal Patient's awareness of abnormal movements (rate only patient's report): No Awareness, Dental Status Current problems with teeth and/or dentures?: No Does patient usually wear dentures?: No  CIWA:  CIWA-Ar Total: 0 COWS:  COWS Total Score: 0  Musculoskeletal: Strength & Muscle Tone: within normal limits Gait & Station: normal Patient leans: N/A  Psychiatric Specialty Exam: Physical Exam  Nursing note and vitals reviewed. Psychiatric: His speech is normal. Judgment and thought content normal. His mood appears anxious. He is actively hallucinating. Cognition and memory are normal.    Review of Systems  Psychiatric/Behavioral: Positive for hallucinations.  All other systems reviewed and are negative.   Blood pressure (!) 111/57, pulse 78, temperature 97.7 F (36.5 C), temperature source Oral, resp. rate 18, height 6\' 2"  (1.88 m), weight 94.3 kg (208 lb), SpO2 98 %.Body mass index is 26.71 kg/m.  General Appearance: Casual  Eye Contact:  Fair  Speech:  Clear and Coherent  Volume:  Normal  Mood:  Anxious  Affect:  Blunt  Thought Process:  Goal Directed and Descriptions of Associations: Intact  Orientation:  Full (Time, Place, and Person)  Thought Content:  Hallucinations: Auditory  Suicidal Thoughts:  No  Homicidal Thoughts:  No  Memory:  Immediate;   Fair Recent;   Fair Remote;   Fair  Judgement:  Poor  Insight:  Shallow  Psychomotor Activity:  Normal  Concentration:  Concentration: Fair and Attention Span: Fair  Recall:  [employer] of Knowledge:  Fair  Language:  Fair  Akathisia:  No  Handed:  Right  AIMS (if indicated):     Assets:  Communication Skills Desire for Improvement Financial Resources/Insurance Housing Physical Health Resilience Social Support  ADL's:  Intact  Cognition:  WNL  Sleep:  Number of Hours: 6.25     Have you used any form of tobacco in the last 30 days?  (Cigarettes, Smokeless Tobacco, Cigars, and/or Pipes): Yes  Has this patient used any form of tobacco in the last 30 days? (Cigarettes, Smokeless Tobacco, Cigars, and/or Pipes) Yes, Yes, A prescription for an FDA-approved tobacco cessation medication was offered at discharge and the patient refused  Blood Alcohol level:  Lab Results  Component Value Date   [facility] <5 06/25/2017   ETH <5 03/23/2017    Metabolic Disorder Labs:  Lab Results  Component Value Date   HGBA1C 5.2 03/26/2017   MPG 103 03/26/2017   MPG 103 11/03/2016   Lab Results  Component Value Date   PROLACTIN 1.7 (L) 01/27/2016   Lab Results  Component Value Date   CHOL 183 06/27/2017   TRIG 99 06/27/2017   HDL 47 06/27/2017   CHOLHDL 3.9 06/27/2017   VLDL 20 06/27/2017   LDLCALC 116 (H) 06/27/2017   LDLCALC 128 (H) 03/26/2017    See Psychiatric Specialty Exam and Suicide Risk Assessment completed by Attending Physician prior to discharge.  Discharge destination:  Home  Is patient on multiple antipsychotic therapies at discharge:  Yes,   Do you recommend tapering to monotherapy for antipsychotics?  Yes   Has Patient had three or more failed trials of antipsychotic monotherapy by history:  Yes,   Antipsychotic medications that previously failed include:   1.  abilify., 2.  risperdal. and 3.  zyprexa.  Recommended Plan for Multiple Antipsychotic Therapies: Additional reason(s) for multiple antispychotic treatment:  single agent is ineffective.  Discharge Instructions    Diet - low sodium heart healthy    Complete by:  As directed    Increase activity slowly    Complete by:  As directed      Allergies as of 07/03/2017   No Known Allergies     Medication List    TAKE these medications     Indication  ARIPiprazole 20 MG tablet Commonly known as:  ABILIFY Take 1 tablet (20 mg total) by mouth daily. What changed:  Another medication with the same name was changed. Make sure you understand how and when to  take each.  Indication:  Schizophrenia   ARIPiprazole ER 400 MG Susr Inject 400 mg into the muscle every 28 (twenty-eight) days. Due on September 18. What changed:  additional instructions  Indication:  Schizophrenia   fluvoxaMINE 100 MG tablet Commonly known as:  LUVOX Take 1 tablet (100 mg total) by mouth at bedtime.  Indication:  Obsessive Compulsive Disorder, Panic Disorder   haloperidol 10 MG tablet Commonly known as:  HALDOL Take 1 tablet (10 mg total) by mouth at bedtime.  Indication:  Schizophrenia   haloperidol decanoate 100 MG/ML injection Commonly known as:  HALDOL DECANOATE Inject 1 mL (100 mg total) into the muscle every 28 (twenty-eight) days. Next dose on September 18.  Indication:  Schizophrenia   traZODone 100  MG tablet Commonly known as:  DESYREL Take 1 tablet (100 mg total) by mouth at bedtime.  Indication:  Trouble Sleeping      Follow-up Information    [facility] Follow up on 07/03/2017.   Why:  [facility] Support Services, [phone], will pick you up from the hospital to resume services. Contact information: [address] [place] [address] [phone]        [facility] Go on 07/05/2017.   Why:  Please attend your discharge appointment at [facility] on Thursday, 07/05/17, at 12:30pm.  Please bring a copy of your hospital discharge paperwork. Contact information: [address] [place] [address] [phone]           Follow-up recommendations:  Activity:  as tolerated. Diet:  low sodium heart healthy. Other:  keep follow up appointments.  Comments:    Signed: Kristine Linea, MD 07/03/2017, 9:30 AM

## 2017-07-03 NOTE — Plan of Care (Signed)
Problem: [facility] Participation in Recreation Therapeutic Interventions Goal: STG-Patient will demonstrate improved self esteem by identif STG: Self-Esteem - Within 4 treatment sessions, patient will verbalize at least 5 positive affirmation statements in each of 2 treatment sessions to increase self-esteem.  Outcome: Completed/Met Date Met: 07/03/17 Treatment Session 2; Completed 2 out of 2: At approximately 10:30 am, LRT met with patient in patient room. Patient verbalized 5 positive affirmation statements. Patient reported it felt "good". LRT encouraged patient to continue saying positive affirmation statements.  Leonette Monarch, LRT/CTRS 08.21.18 2:13 pm Goal: STG-Other Recreation Therapy Goal (Specify) STG: Time Management - Within 4 treatment sessions, patient will verbalize understanding of scheduling in each of 2 treatment sessions to increase time management skills.  Outcome: Completed/Met Date Met: 07/03/17 Treatment Session 2; Completed 2 out of 2: At approximately 10:30 am, LRT met with patient in patient room. Patient verbalized understanding of scheduling. LRT encouraged patient to use his schedules to help him manage his time better.  Leonette Monarch, LRT/CTRS 08.21.18 2:15 pm

## 2017-07-03 NOTE — [facility] Group Notes (Signed)
Goals Group  Date/Time: 07/03/2017, 9:00 AM Type of Therapy and Topic: Group Therapy: Goals Group: SMART Goals  ?  Participation Level: Moderate  ?  Description of Group:  ?  The purpose of a daily goals group is to assist and guide patients in setting recovery/wellness-related goals. The objective is to set goals as they relate to the crisis in which they were admitted. Patients will be using SMART goal modalities to set measurable goals. Characteristics of realistic goals will be discussed and patients will be assisted in setting and processing how one will reach their goal. Facilitator will also assist patients in applying interventions and coping skills learned in psycho-education groups to the SMART goal and process how one will achieve defined goal.  ?  Therapeutic Goals:  ?  -Patients will develop and document one goal related to or their crisis in which brought them into treatment.  -Patients will be guided by LCSW using SMART goal setting modality in how to set a measurable, attainable, realistic and time sensitive goal.  -Patients will process barriers in reaching goal.  -Patients will process interventions in how to overcome and successful in reaching goal.  ?  Patient's Goal:  Patient stated that his goal is to stay awake and out of bed. In order to accomplish this goal he stated that he will go to groups.  ?  Therapeutic Modalities:  Motivational Interviewing  Cognitive Behavioral Therapy  Crisis Intervention Model  SMART goals setting  [name], MSW, LCSW-A 07/03/2017, 9:52AM

## 2017-07-03 NOTE — BHH Suicide Risk Assessment (Signed)
[facility] Discharge Suicide Risk Assessment   Principal Problem: Undifferentiated schizophrenia [facility]) Discharge Diagnoses:  Patient Active Problem List   Diagnosis Date Noted  . Undifferentiated schizophrenia (HCC) [F20.3] 03/24/2017  . 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 Panic attack [F41.0] 06/23/2016  . Noncompliance [Z91.19] 02/29/2016  . Tobacco use disorder [F17.200] 01/27/2016    Total Time spent with patient: 30 minutes  Musculoskeletal: Strength & Muscle Tone: within normal limits Gait & Station: normal Patient leans: N/A  Psychiatric Specialty Exam: Review of Systems  Psychiatric/Behavioral: Positive for hallucinations.  All other systems reviewed and are negative.   Blood pressure (!) 111/57, pulse 78, temperature 97.7 F (36.5 C), temperature source Oral, resp. rate 18, height 6\' 2"  (1.88 m), weight 94.3 kg (208 lb), SpO2 98 %.Body mass index is 26.71 kg/m.  General Appearance: Casual  Eye Contact::  Fair  Speech:  Clear and Coherent409  Volume:  Normal  Mood:  Anxious  Affect:  Blunt  Thought Process:  Goal Directed and Descriptions of Associations: Intact  Orientation:  Full (Time, Place, and Person)  Thought Content:  Hallucinations: Auditory  Suicidal Thoughts:  No  Homicidal Thoughts:  No  Memory:  Immediate;   Fair Recent;   Fair Remote;   Fair  Judgement:  Poor  Insight:  Shallow  Psychomotor Activity:  Normal  Concentration:  Fair  Recall:  [employer] of Knowledge:Fair  Language: Fair  Akathisia:  No  Handed:  Right  AIMS (if indicated):     Assets:  Communication Skills Desire for Improvement Financial Resources/Insurance Housing Physical Health Resilience Social Support  Sleep:  Number of Hours: 6.25  Cognition: WNL  ADL's:  Intact   Mental Status Per Nursing Assessment::   On Admission:     Demographic Factors:  Male, Caucasian and Living alone  Loss Factors: NA  Historical  Factors: Impulsivity  Risk Reduction Factors:   Sense of responsibility to family, Positive social support and Positive therapeutic relationship  Continued Clinical Symptoms:  Alcohol/Substance Abuse/Dependencies Schizophrenia:   Less than 28 years old Paranoid or undifferentiated type  Cognitive Features That Contribute To Risk:  None    Suicide Risk:  Minimal: No identifiable suicidal ideation.  Patients presenting with no risk factors but with morbid ruminations; may be classified as minimal risk based on the severity of the depressive symptoms  Follow-up Information    [facility] Follow up on 07/03/2017.   Why:  [facility], [phone], will pick you up from the hospital to resume services. Contact information: [address] [place] [address] [phone]        [facility] Go on 07/05/2017.   Why:  Please attend your discharge appointment at [facility] on Thursday, 07/05/17, at 12:30pm.  Please bring a copy of your hospital discharge paperwork. Contact information: [address] [place] [address] [phone]           Plan Of Care/Follow-up recommendations:  Activity:  as tolerated. Diet:  low sodiumheart healthy. Other:  keep follow up appointments.  Kristine Linea, MD 07/03/2017, 9:30 AM

## 2017-07-03 NOTE — Progress Notes (Signed)
  [facility] Adult Case Management Discharge Plan :  Will you be returning to the same living situation after discharge:  Yes,    At discharge, do you have transportation home?: Yes,    Do you have the ability to pay for your medications: Yes,     Release of information consent forms completed and in the chart;  Patient's signature needed at discharge.  Patient to Follow up at: Follow-up Information    [facility] Follow up on 07/03/2017.   Why:  [facility], [phone], will pick you up from the hospital to resume services. Contact information: [address] [place] [address] [phone]        [facility] Go on 07/05/2017.   Why:  Please attend your discharge appointment at [facility] on Thursday, 07/05/17, at 12:30pm.  Please bring a copy of your hospital discharge paperwork. Contact information: [address] [place] [address] [phone]           Next level of care provider has access to [facility] Link:no  Safety Planning and Suicide Prevention discussed: Yes,     Have you used any form of tobacco in the last 30 days? (Cigarettes, Smokeless Tobacco, Cigars, and/or Pipes): Yes  Has patient been referred to the Quitline?: Patient refused referral  Patient has been referred for addiction treatment: Yes  Glennon Mac, LCSW 07/03/2017, 5:20 PM

## 2017-07-03 NOTE — Progress Notes (Signed)
Recreation Therapy Notes  Date: 08.21.18 Time: 9:30 am Location: Craft Room  Group Topic: Goal Setting  Goal Area(s) Addresses:  Patient will write at least one goal. Patient will write at least one obstacle.  Behavioral Response: Intermittently Attentive  Intervention: Recovery Goal Chart  Activity: Patients were instructed to make a Recovery Goal Chart including their goals, obstacles, the date they started working on their goals, and the date they achieved their goals.  Education: LRT educated patients on healthy ways to celebrate reaching their goals.  Education Outcome: In group clarification offered  Clinical Observations/Feedback: Patient copied the outline on his worksheet, but did not write any goals. Patient did not contribute to group discussion.  Jacquelynn Cree, LRT/CTRS 07/03/2017 10:13 AM

## 2017-07-03 NOTE — Progress Notes (Signed)
Recreation Therapy Notes  INPATIENT RECREATION TR PLAN  Patient Details Name: Edwin Montes MRN: [id number] DOB: [birth year] Today's Date: 07/03/2017  Rec Therapy Plan Is patient appropriate for Therapeutic Recreation?: Yes Treatment times per week: At least once a week TR Treatment/Interventions: 1:1 session, Group participation (Comment) (Appropriate participation in daily recreational therapy tx)  Discharge Criteria Pt will be discharged from therapy if:: Treatment goals are met, Discharged Treatment plan/goals/alternatives discussed and agreed upon by:: Patient/family  Discharge Summary Short term goals set: See Care Plan Short term goals met: Complete Progress toward goals comments: One-to-one attended Which groups?: Self-esteem, Leisure education, Coping skills, Goal setting One-to-one attended: Self-esteem, time management Reason goals not met: N/A Therapeutic equipment acquired: None Reason patient discharged from therapy: Discharge from hospital Pt/family agrees with progress & goals achieved: Yes Date patient discharged from therapy: 07/03/17   Leonette Monarch, LRT/CTRS 07/03/2017, 2:16 PM

## 2017-07-15 ENCOUNTER — Encounter: Payer: Self-pay | Admitting: Emergency Medicine

## 2017-07-15 ENCOUNTER — Emergency Department
Admission: EM | Admit: 2017-07-15 | Discharge: 2017-07-15 | Disposition: A | Discharge status: Home / Self Care | Payer: Medicare Other | Attending: Emergency Medicine | Admitting: Emergency Medicine

## 2017-07-15 DIAGNOSIS — F209 Schizophrenia, unspecified: Secondary | ICD-10-CM

## 2017-07-15 DIAGNOSIS — R440 Auditory hallucinations: Secondary | ICD-10-CM | POA: Insufficient documentation

## 2017-07-15 DIAGNOSIS — Z79899 Other long term (current) drug therapy: Secondary | ICD-10-CM | POA: Insufficient documentation

## 2017-07-15 DIAGNOSIS — F17210 Nicotine dependence, cigarettes, uncomplicated: Secondary | ICD-10-CM | POA: Diagnosis not present

## 2017-07-15 LAB — COMPREHENSIVE METABOLIC PANEL
ALK PHOS: 64 U/L (ref 38–126)
ALT: 35 U/L (ref 17–63)
ANION GAP: 8 (ref 5–15)
AST: 30 U/L (ref 15–41)
Albumin: 4.6 g/dL (ref 3.5–5.0)
BUN: 16 mg/dL (ref 6–20)
CALCIUM: 9.1 mg/dL (ref 8.9–10.3)
CO2: 28 mmol/L (ref 22–32)
CREATININE: 1.08 mg/dL (ref 0.61–1.24)
Chloride: 103 mmol/L (ref 101–111)
Glucose, Bld: 94 mg/dL (ref 65–99)
Potassium: 4 mmol/L (ref 3.5–5.1)
SODIUM: 139 mmol/L (ref 135–145)
TOTAL PROTEIN: 7.3 g/dL (ref 6.5–8.1)
Total Bilirubin: 0.8 mg/dL (ref 0.3–1.2)

## 2017-07-15 LAB — ACETAMINOPHEN LEVEL

## 2017-07-15 LAB — CBC
HCT: 44.7 % (ref 40.0–52.0)
Hemoglobin: 15.7 g/dL (ref 13.0–18.0)
MCH: 31.1 pg (ref 26.0–34.0)
MCHC: 35.2 g/dL (ref 32.0–36.0)
MCV: 88.6 fL (ref 80.0–100.0)
PLATELETS: 255 10*3/uL (ref 150–440)
RBC: 5.05 MIL/uL (ref 4.40–5.90)
RDW: 12.8 % (ref 11.5–14.5)
WBC: 7.5 10*3/uL (ref 3.8–10.6)

## 2017-07-15 LAB — URINE DRUG SCREEN, QUALITATIVE (ARMC ONLY)
Amphetamines, Ur Screen: NOT DETECTED
BARBITURATES, UR SCREEN: NOT DETECTED
Benzodiazepine, Ur Scrn: NOT DETECTED
CANNABINOID 50 NG, UR ~~LOC~~: POSITIVE — AB
Cocaine Metabolite,Ur ~~LOC~~: NOT DETECTED
MDMA (ECSTASY) UR SCREEN: NOT DETECTED
Methadone Scn, Ur: NOT DETECTED
Opiate, Ur Screen: NOT DETECTED
PHENCYCLIDINE (PCP) UR S: NOT DETECTED
Tricyclic, Ur Screen: NOT DETECTED

## 2017-07-15 LAB — SALICYLATE LEVEL: Salicylate Lvl: <7 mg/dL (ref 2.8–30.0)

## 2017-07-15 LAB — ETHANOL

## 2017-07-15 NOTE — ED Notes (Addendum)
[facility], [phone], care coordinator number. Called, left message to call back.

## 2017-07-15 NOTE — ED Notes (Signed)
Pt given dinner tray.

## 2017-07-15 NOTE — Progress Notes (Signed)
Patient information reviewed by LCSW and handoff written up to assist weekday LCSW   Leenah Seidner LCSW

## 2017-07-15 NOTE — ED Triage Notes (Signed)
Pt states he started seeing things and hearing voices today. Pt states he was here 2 weeks ago and medication was changed. Pt reports things started to get better after last visit but that he started feeling that way yesterday. Denies SI or HI.

## 2017-07-15 NOTE — ED Notes (Addendum)
Mother, Edwin Montes called for ride home. States she lives an hour away and is going to get contact number for patient of a [name]. States to call back after she can find number. Called back X2, no answer at this time.

## 2017-07-15 NOTE — ED Notes (Addendum)
Reports seeing shadows that aren't really there per patient report. Denies SI or HI. States his medicines were changed approx 2 weeks ago. Pt states that his "symptoms" were better but now he is seeing things and does not feel well.

## 2017-07-15 NOTE — ED Provider Notes (Signed)
[facility] Emergency Department Provider Note  ____________________________________________  Time seen: Approximately 5:29 PM  I have reviewed the triage vital signs and the nursing notes.   HISTORY  Chief Complaint Mental Health Problem  Level 5 Caveat: Portions of the History and Physical are unable to be obtained due to patient being a poor historian   HPI Edwin Montes is a 31 y.o. male who complains of auditory hallucinations for the past2 days. Intermittent, 2 episodes, each lasting about an hour at a time, resolve spontaneously. Otherwise in his usual state of health without acute symptoms. Compliant with his medications. He hears voices but can't make them out. They make him feel anxious but not suicidal. He does not have any thoughts of harming himself or others. Mild severity. No aggravating or alleviating factors. No headache fever chills sweats neck pain or trauma.     Past Medical History:  Diagnosis Date  . Anxiety   . Schizophrenia [facility])      Patient Active Problem List   Diagnosis Date Noted  . 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 Cannabis use disorder, moderate, dependence (HCC) 11/03/2016  . Panic attack 06/23/2016  . Noncompliance 02/29/2016  . Tobacco use disorder 01/27/2016     Past Surgical History:  Procedure Laterality Date  . NO PAST SURGERIES       Prior to Admission medications   Medication Sig Start Date End Date Taking? Authorizing Provider  ARIPiprazole (ABILIFY) 20 MG tablet Take 1 tablet (20 mg total) by mouth daily. 07/02/17   Pucilowska, Braulio Conte B, MD  ARIPiprazole ER 400 MG SUSR Inject 400 mg into the muscle every 28 (twenty-eight) days. Due on September 18. 07/02/17   Pucilowska, Braulio Conte B, MD  fluvoxaMINE (LUVOX) 100 MG tablet Take 1 tablet (100 mg total) by mouth at bedtime. 07/02/17   Pucilowska, Braulio Conte B, MD  haloperidol (HALDOL) 10 MG tablet Take 1 tablet (10 mg total) by mouth at  bedtime. 07/02/17   Pucilowska, Braulio Conte B, MD  haloperidol decanoate (HALDOL DECANOATE) 100 MG/ML injection Inject 1 mL (100 mg total) into the muscle every 28 (twenty-eight) days. Next dose on September 18. 07/27/17   Pucilowska, Jolanta B, MD  traZODone (DESYREL) 100 MG tablet Take 1 tablet (100 mg total) by mouth at bedtime. 07/02/17   Shari Prows, MD     Allergies Patient has no known allergies.   No family history on file.  Social History Social History  Substance Use Topics  . Smoking status: Current Some Day Smoker    Packs/day: 0.50    Years: 1.00    Types: Cigarettes  . Smokeless tobacco: Never Used  . Alcohol use No    Review of Systems  Constitutional:   No fever or chills.  ENT:   No sore throat. No rhinorrhea. Cardiovascular:   No chest pain or syncope. Respiratory:   No dyspnea or cough. Gastrointestinal:   Negative for abdominal pain, vomiting and diarrhea.  Musculoskeletal:   Negative for focal pain or swelling All other systems reviewed and are negative except as documented above in ROS and HPI.  ____________________________________________   PHYSICAL EXAM:  VITAL SIGNS: ED Triage Vitals  Enc Vitals Group     BP 07/15/17 1504 132/81     Pulse Rate 07/15/17 1504 80     Resp 07/15/17 1504 14     Temp 07/15/17 1504 98.4 F (36.9 C)     Temp Source 07/15/17 1504 Oral     SpO2 07/15/17  1504 100 %     Weight 07/15/17 1505 208 lb (94.3 kg)     Height 07/15/17 1505 6\' 2"  (1.88 m)     Head Circumference --      Peak Flow --      Pain Score 07/15/17 1504 0     Pain Loc --      Pain Edu? --      Excl. in GC? --     Vital signs reviewed, nursing assessments reviewed.   Constitutional:   Alert and oriented. Well appearing and in no distress. Eyes:   No scleral icterus.  EOMI. No nystagmus. No conjunctival pallor. PERRL. ENT   Head:   Normocephalic and atraumatic.   Nose:   No congestion/rhinnorhea.    Mouth/Throat:   MMM, no  pharyngeal erythema. No peritonsillar mass.    Neck:   No meningismus. Full ROM Hematological/Lymphatic/Immunilogical:   No cervical lymphadenopathy. Cardiovascular:   RRR. Symmetric bilateral radial and DP pulses.  No murmurs.  Respiratory:   Normal respiratory effort without tachypnea/retractions. Breath sounds are clear and equal bilaterally. No wheezes/rales/rhonchi. Gastrointestinal:   Soft and nontender. Non distended. There is no CVA tenderness.  No rebound, rigidity, or guarding. Genitourinary:   deferred Musculoskeletal:   Normal range of motion in all extremities. No joint effusions.  No lower extremity tenderness.  No edema. Neurologic:   restricted speech flat affect  Motor grossly intact. No gross focal neurologic deficits are appreciated.  Skin:    Skin is warm, dry and intact. No rash noted.  No petechiae, purpura, or bullae.  ____________________________________________    LABS (pertinent positives/negatives) (all labs ordered are listed, but only abnormal results are displayed) Labs Reviewed  URINE DRUG SCREEN, QUALITATIVE ([facility] ONLY) - Abnormal; Notable for the following:       Result Value   Cannabinoid 50 Ng, Ur [place] POSITIVE (*)    All other components within normal limits  ACETAMINOPHEN LEVEL - Abnormal; Notable for the following:    Acetaminophen (Tylenol), Serum <10 (*)    All other components within normal limits  COMPREHENSIVE METABOLIC PANEL  ETHANOL  CBC  SALICYLATE LEVEL   ____________________________________________   EKG    ____________________________________________    RADIOLOGY  No results found.  ____________________________________________   PROCEDURES Procedures  ____________________________________________   INITIAL IMPRESSION / ASSESSMENT AND PLAN / ED COURSE  Pertinent labs & imaging results that were available during my care of the patient were reviewed by me and considered in my medical decision making (see chart for  details).  patient well appearing no acute distress normal vital signs, medically stable. Has some symptoms of schizophrenia but compliant with his medications. Psychiatry consult obtained which finds the patient to be psychiatrically stable suitable for outpatient follow-up with his [facility] program tomorrow.      ____________________________________________   FINAL CLINICAL IMPRESSION(S) / ED DIAGNOSES  Final diagnoses:  Auditory hallucination  Schizophrenia, unspecified type [facility](HCC)      New Prescriptions   No medications on file     Portions of this note were generated with dragon dictation software. Dictation errors may occur despite best attempts at proofreading.    Sharman CheekStafford, Herbert Aguinaldo, MD 07/15/17 [phone]

## 2017-07-15 NOTE — ED Notes (Signed)
Camera set up at bedside for telepsych assessment.

## 2017-07-29 ENCOUNTER — Emergency Department
Admission: EM | Admit: 2017-07-29 | Discharge: 2017-07-29 | Disposition: A | Discharge status: Home / Self Care | Payer: Medicare Other | Attending: Student in an Organized Health Care Education/Training Program | Admitting: Student in an Organized Health Care Education/Training Program

## 2017-07-29 ENCOUNTER — Emergency Department: Payer: Medicare Other

## 2017-07-29 DIAGNOSIS — Z79899 Other long term (current) drug therapy: Secondary | ICD-10-CM | POA: Diagnosis not present

## 2017-07-29 DIAGNOSIS — F419 Anxiety disorder, unspecified: Secondary | ICD-10-CM | POA: Insufficient documentation

## 2017-07-29 DIAGNOSIS — F17210 Nicotine dependence, cigarettes, uncomplicated: Secondary | ICD-10-CM | POA: Insufficient documentation

## 2017-07-29 DIAGNOSIS — R0602 Shortness of breath: Secondary | ICD-10-CM | POA: Insufficient documentation

## 2017-07-29 DIAGNOSIS — R0789 Other chest pain: Secondary | ICD-10-CM | POA: Diagnosis not present

## 2017-07-29 DIAGNOSIS — Z9114 Patient's other noncompliance with medication regimen: Secondary | ICD-10-CM | POA: Diagnosis not present

## 2017-07-29 DIAGNOSIS — R450 Nervousness: Secondary | ICD-10-CM | POA: Diagnosis present

## 2017-07-29 LAB — CBC
HEMATOCRIT: 44 % (ref 40.0–52.0)
HEMOGLOBIN: 15.5 g/dL (ref 13.0–18.0)
MCH: 30.9 pg (ref 26.0–34.0)
MCHC: 35.3 g/dL (ref 32.0–36.0)
MCV: 87.5 fL (ref 80.0–100.0)
Platelets: 242 10*3/uL (ref 150–440)
RBC: 5.03 MIL/uL (ref 4.40–5.90)
RDW: 12.9 % (ref 11.5–14.5)
WBC: 7.5 10*3/uL (ref 3.8–10.6)

## 2017-07-29 LAB — BASIC METABOLIC PANEL
ANION GAP: 7 (ref 5–15)
BUN: 16 mg/dL (ref 6–20)
CHLORIDE: 105 mmol/L (ref 101–111)
CO2: 29 mmol/L (ref 22–32)
Calcium: 9.2 mg/dL (ref 8.9–10.3)
Creatinine, Ser: 0.93 mg/dL (ref 0.61–1.24)
GFR calc Af Amer: >60 mL/min (ref >60)
GFR calc non Af Amer: >60 mL/min (ref >60)
GLUCOSE: 98 mg/dL (ref 65–99)
POTASSIUM: 3.9 mmol/L (ref 3.5–5.1)
Sodium: 141 mmol/L (ref 135–145)

## 2017-07-29 LAB — TROPONIN I: Troponin I: <0.03 ng/mL (ref <0.03)

## 2017-07-29 MED ORDER — FLUVOXAMINE MALEATE 50 MG PO TABS
100.0000 mg | ORAL_TABLET | Freq: Every day | ORAL | Status: DC
  Administered 2017-07-29: 100 mg via ORAL
  Filled 2017-07-29: qty 1

## 2017-07-29 MED ORDER — LORAZEPAM 1 MG PO TABS
1.0000 mg | ORAL_TABLET | Freq: Once | ORAL | Status: AC
  Administered 2017-07-29: 1 mg via ORAL
  Filled 2017-07-29: qty 1

## 2017-07-29 MED ORDER — HALOPERIDOL 5 MG PO TABS
10.0000 mg | ORAL_TABLET | Freq: Once | ORAL | Status: AC
  Administered 2017-07-29: 10 mg via ORAL
  Filled 2017-07-29: qty 2

## 2017-07-29 NOTE — ED Provider Notes (Signed)
[facility] Emergency Department Provider Note    None    (approximate)  I have reviewed the triage vital signs and the nursing notes.   HISTORY  Chief Complaint Anxiety    HPI Edwin Montes is a 31 y.o. male The history as's anxiety and schizophrenia presents with over 24 hours of increased nervousness chest discomfort and shortness of breath. Patient states that on Friday he stopped taking his Haldol and Luvox. Cannot communicate a reason why he did this. States he is not certain why he stopped taking it. States he's had these symptoms in the past when he's gone off of his medications. States that he is seeing shadows and having hallucinations. Not hearing any voices. No command hallucinations. Denies any SI or HI.denies any thoughts of hurting anyone else. Denies any fevers. No nausea or vomiting.  No recent stressors.   Past Medical History:  Diagnosis Date  . Anxiety   . Schizophrenia (HCC)    No family history on file. Past Surgical History:  Procedure Laterality Date  . NO PAST SURGERIES     Patient Active Problem List   Diagnosis Date Noted  . 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 Cannabis use disorder, moderate, dependence (HCC) 11/03/2016  . Panic attack 06/23/2016  . Noncompliance 02/29/2016  . Tobacco use disorder 01/27/2016      Prior to Admission medications   Medication Sig Start Date End Date Taking? Authorizing Provider  ARIPiprazole (ABILIFY) 20 MG tablet Take 1 tablet (20 mg total) by mouth daily. 07/02/17   Pucilowska, Braulio Conte B, MD  ARIPiprazole ER 400 MG SUSR Inject 400 mg into the muscle every 28 (twenty-eight) days. Due on September 18. 07/02/17   Pucilowska, Braulio Conte B, MD  fluvoxaMINE (LUVOX) 100 MG tablet Take 1 tablet (100 mg total) by mouth at bedtime. 07/02/17   Pucilowska, Braulio Conte B, MD  haloperidol (HALDOL) 10 MG tablet Take 1 tablet (10 mg total) by mouth at bedtime. 07/02/17   Pucilowska, Braulio Conte  B, MD  haloperidol decanoate (HALDOL DECANOATE) 100 MG/ML injection Inject 1 mL (100 mg total) into the muscle every 28 (twenty-eight) days. Next dose on September 18. 07/27/17   Pucilowska, Jolanta B, MD  traZODone (DESYREL) 100 MG tablet Take 1 tablet (100 mg total) by mouth at bedtime. 07/02/17   Shari Prows, MD    Allergies Patient has no known allergies.    Social History Social History  Substance Use Topics  . Smoking status: Current Some Day Smoker    Packs/day: 0.50    Years: 1.00    Types: Cigarettes  . Smokeless tobacco: Never Used  . Alcohol use No    Review of Systems Patient denies headaches, rhinorrhea, blurry vision, numbness, shortness of breath, chest pain, edema, cough, abdominal pain, nausea, vomiting, diarrhea, dysuria, fevers, rashes or hallucinations unless otherwise stated above in HPI. ____________________________________________   PHYSICAL EXAM:  VITAL SIGNS: Vitals:   07/29/17 1515  BP: 136/77  Pulse: 97  Resp: 16  Temp: 99 F (37.2 C)  SpO2: 96%    Constitutional: Alert and oriented. Anxious appearing but in no acute distress. Eyes: Conjunctivae are normal.  Head: Atraumatic. Nose: No congestion/rhinnorhea. Mouth/Throat: Mucous membranes are moist.   Neck: No stridor. Painless ROM.  Cardiovascular: Normal rate, regular rhythm. Grossly normal heart sounds.  Good peripheral circulation. Respiratory: Normal respiratory effort.  No retractions. Lungs CTAB. Gastrointestinal: Soft and nontender. No distention. No abdominal bruits. No CVA tenderness. Musculoskeletal: No lower extremity tenderness nor  edema.  No joint effusions. Neurologic:  Normal speech and language. No gross focal neurologic deficits are appreciated. No facial droop Skin:  Skin is warm, dry and intact. No rash noted. Psychiatric: anxious, appears to have some thought blocking and is slow to answer questions but seems to answer  appropriately ____________________________________________   LABS (all labs ordered are listed, but only abnormal results are displayed)  Results for orders placed or performed during the hospital encounter of 07/29/17 (from the past 24 hour(s))  CBC     Status: None   Collection Time: 07/29/17  3:20 PM  Result Value Ref Range   WBC 7.5 3.8 - 10.6 K/uL   RBC 5.03 4.40 - 5.90 MIL/uL   Hemoglobin 15.5 13.0 - 18.0 g/dL   HCT 82.9 56.2 - 13.0 %   MCV 87.5 80.0 - 100.0 fL   MCH 30.9 26.0 - 34.0 pg   MCHC 35.3 32.0 - 36.0 g/dL   RDW 86.5 78.4 - 69.6 %   Platelets 242 150 - 440 K/uL  Basic metabolic panel     Status: None   Collection Time: 07/29/17  3:20 PM  Result Value Ref Range   Sodium 141 135 - 145 mmol/L   Potassium 3.9 3.5 - 5.1 mmol/L   Chloride 105 101 - 111 mmol/L   CO2 29 22 - 32 mmol/L   Glucose, Bld 98 65 - 99 mg/dL   BUN 16 6 - 20 mg/dL   Creatinine, Ser 2.95 0.61 - 1.24 mg/dL   Calcium 9.2 8.9 - 28.4 mg/dL   GFR calc non Af Amer >60 >60 mL/min   GFR calc Af Amer >60 >60 mL/min   Anion gap 7 5 - 15   ____________________________________________  EKG My review and personal interpretation at Time: 15:45   Indication: chest pain  Rate: 80  Rhythm: sinus Axis: normal Other: normal intervals, no stemi, no brugada no pr depressions ____________________________________________  RADIOLOGY  I personally reviewed all radiographic images ordered to evaluate for the above acute complaints and reviewed radiology reports and findings.  These findings were personally discussed with the patient.  Please see medical record for radiology report.  ____________________________________________   PROCEDURES  Procedure(s) performed:  Procedures    Critical Care performed: no ____________________________________________   INITIAL IMPRESSION / ASSESSMENT AND PLAN / ED COURSE  Pertinent labs & imaging results that were available during my care of the patient were reviewed  by me and considered in my medical decision making (see chart for details).  DDX: Psychosis, delirium, medication effect, noncompliance, polysubstance abuse, Si, Hi, depression   Edwin Montes is a 31 y.o. who presents to the ED with increase anxiety shortness breath and chest pain after stopping his antipsychotic medications on Friday. He has no SI or HI. Has some mild thought blocking but appears well kempt with appropriate thought process. Patient given Ativan and restarted on his home medications. Blood work is reassuring. EKG shows no ischemia and chest x-ray without any abnormalities. Vital signs are stable. Patient stable for follow-up with psychiatrist.      ____________________________________________   FINAL CLINICAL IMPRESSION(S) / ED DIAGNOSES  Final diagnoses:  Anxiety  Nonadherence to medication      NEW MEDICATIONS STARTED DURING THIS VISIT:  New Prescriptions   No medications on file     Note:  This document was prepared using Dragon voice recognition software and may include unintentional dictation errors.    Willy Eddy, MD 07/29/17 [phone]

## 2017-07-29 NOTE — Discharge Instructions (Signed)
Restart your medications as prescribed.  Follow up with [facility] and psychiatrist this week.  Return for any thoughts of harming yourself or others.

## 2017-07-29 NOTE — ED Triage Notes (Addendum)
Pt came to ED via EMS. Reports has not been taking meds since Friday, reports takes haldol. Denies chest pain, n/v. VS stable.

## 2017-07-29 NOTE — ED Notes (Signed)
Patient transported to X-ray 

## 2017-08-05 ENCOUNTER — Emergency Department
Admission: EM | Admit: 2017-08-05 | Discharge: 2017-08-06 | Disposition: A | Discharge status: Home / Self Care | Payer: Medicare Other | Attending: Emergency Medicine | Admitting: Emergency Medicine

## 2017-08-05 ENCOUNTER — Emergency Department: Payer: Medicare Other

## 2017-08-05 DIAGNOSIS — Z79899 Other long term (current) drug therapy: Secondary | ICD-10-CM | POA: Diagnosis not present

## 2017-08-05 DIAGNOSIS — J209 Acute bronchitis, unspecified: Secondary | ICD-10-CM | POA: Diagnosis not present

## 2017-08-05 DIAGNOSIS — F17210 Nicotine dependence, cigarettes, uncomplicated: Secondary | ICD-10-CM | POA: Insufficient documentation

## 2017-08-05 DIAGNOSIS — R0602 Shortness of breath: Secondary | ICD-10-CM | POA: Diagnosis present

## 2017-08-05 LAB — COMPREHENSIVE METABOLIC PANEL
ALBUMIN: 4.1 g/dL (ref 3.5–5.0)
ALK PHOS: 65 U/L (ref 38–126)
ALT: 15 U/L — ABNORMAL LOW (ref 17–63)
ANION GAP: 7 (ref 5–15)
AST: 22 U/L (ref 15–41)
BILIRUBIN TOTAL: 0.5 mg/dL (ref 0.3–1.2)
BUN: 10 mg/dL (ref 6–20)
CALCIUM: 9.4 mg/dL (ref 8.9–10.3)
CO2: 28 mmol/L (ref 22–32)
Chloride: 104 mmol/L (ref 101–111)
Creatinine, Ser: 0.9 mg/dL (ref 0.61–1.24)
GFR calc Af Amer: >60 mL/min (ref >60)
GLUCOSE: 90 mg/dL (ref 65–99)
POTASSIUM: 4.2 mmol/L (ref 3.5–5.1)
Sodium: 139 mmol/L (ref 135–145)
TOTAL PROTEIN: 6.7 g/dL (ref 6.5–8.1)

## 2017-08-05 LAB — CBC
HEMATOCRIT: 42.6 % (ref 40.0–52.0)
Hemoglobin: 15 g/dL (ref 13.0–18.0)
MCH: 30.8 pg (ref 26.0–34.0)
MCHC: 35.1 g/dL (ref 32.0–36.0)
MCV: 87.7 fL (ref 80.0–100.0)
PLATELETS: 243 10*3/uL (ref 150–440)
RBC: 4.85 MIL/uL (ref 4.40–5.90)
RDW: 12.7 % (ref 11.5–14.5)
WBC: 7.4 10*3/uL (ref 3.8–10.6)

## 2017-08-05 LAB — SALICYLATE LEVEL: Salicylate Lvl: <7 mg/dL (ref 2.8–30.0)

## 2017-08-05 LAB — ETHANOL

## 2017-08-05 LAB — ACETAMINOPHEN LEVEL: Acetaminophen (Tylenol), Serum: <10 ug/mL — ABNORMAL LOW (ref 10–30)

## 2017-08-05 LAB — TROPONIN I: Troponin I: <0.03 ng/mL (ref <0.03)

## 2017-08-05 MED ORDER — ALBUTEROL SULFATE (2.5 MG/3ML) 0.083% IN NEBU: 5.0000 mg | INHALATION_SOLUTION | Freq: Once | RESPIRATORY_TRACT | Status: DC

## 2017-08-05 NOTE — ED Triage Notes (Signed)
Patient c/o SOB, hallucinations and blurred vision.  Patient denies pain and SI

## 2017-08-06 MED ORDER — BENZONATATE 100 MG PO CAPS: 100.0000 mg | ORAL_CAPSULE | Freq: Three times a day (TID) | ORAL | 0 refills | Status: AC | PRN

## 2017-08-06 MED ORDER — AZITHROMYCIN 500 MG PO TABS: 500.0000 mg | ORAL_TABLET | Freq: Every day | ORAL | 0 refills | Status: AC

## 2017-08-06 NOTE — ED Provider Notes (Signed)
[facility] Emergency Department Provider Note    First MD Initiated Contact with Patient 08/05/17 2319     (approximate)  I have reviewed the triage vital signs and the nursing notes.   HISTORY  Chief Complaint Shortness of Breath; Hallucinations; and Blurred Vision    HPI Edwin Montes is a 31 y.o. male presents to the emergency department with1 day history of cough and dyspnea. Patient denies any fever,chest pain. Patient denies any lower extremity pain or swelling. No history DVT or PE. Of note triage note states that the patient had hallucinations however he adamantly denies this stating that he has no visual or auditory hallucinations. Patient denies any homicidal or suicidal ideations.   Past Medical History:  Diagnosis Date  . Anxiety   . Schizophrenia [facility])     Patient Active Problem List   Diagnosis Date Noted  . 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 Cannabis use disorder, moderate, dependence (HCC) 11/03/2016  . Panic attack 06/23/2016  . Noncompliance 02/29/2016  . Tobacco use disorder 01/27/2016    Past Surgical History:  Procedure Laterality Date  . NO PAST SURGERIES      Prior to Admission medications   Medication Sig Start Date End Date Taking? Authorizing Provider  ARIPiprazole (ABILIFY) 20 MG tablet Take 1 tablet (20 mg total) by mouth daily. 07/02/17   Pucilowska, Braulio Conte B, MD  ARIPiprazole ER 400 MG SUSR Inject 400 mg into the muscle every 28 (twenty-eight) days. Due on September 18. 07/02/17   Pucilowska, Braulio Conte B, MD  fluvoxaMINE (LUVOX) 100 MG tablet Take 1 tablet (100 mg total) by mouth at bedtime. 07/02/17   Pucilowska, Braulio Conte B, MD  haloperidol (HALDOL) 10 MG tablet Take 1 tablet (10 mg total) by mouth at bedtime. 07/02/17   Pucilowska, Braulio Conte B, MD  haloperidol decanoate (HALDOL DECANOATE) 100 MG/ML injection Inject 1 mL (100 mg total) into the muscle every 28 (twenty-eight) days. Next dose on  September 18. 07/27/17   Pucilowska, Jolanta B, MD  traZODone (DESYREL) 100 MG tablet Take 1 tablet (100 mg total) by mouth at bedtime. 07/02/17   Pucilowska, Ellin Goodie, MD    Allergies no known drug allergies No family history on file.  Social History Social History  Substance Use Topics  . Smoking status: Current Some Day Smoker    Packs/day: 0.50    Years: 1.00    Types: Cigarettes  . Smokeless tobacco: Never Used  . Alcohol use No    Review of Systems Constitutional: No fever/chills Eyes: No visual changes. ENT: No sore throat. Cardiovascular: Denies chest pain. Respiratory: Denies shortness of breath. Gastrointestinal: No abdominal pain.  No nausea, no vomiting.  No diarrhea.  No constipation. Genitourinary: Negative for dysuria. Musculoskeletal: Negative for neck pain.  Negative for back pain. Integumentary: Negative for rash. Neurological: Negative for headaches, focal weakness or numbness. Psychiatric:negative for hallucinations, negative for suicidal ideation, negative for homicidal ideation.   ____________________________________________   PHYSICAL EXAM:  VITAL SIGNS: ED Triage Vitals [08/05/17 2229]  Enc Vitals Group     BP 103/68     Pulse Rate 75     Resp 18     Temp 98.2 F (36.8 C)     Temp Source Oral     SpO2 95 %     Weight 94.3 kg (208 lb)     Height 1.88 m ( )     Head Circumference      Peak Flow  Pain Score      Pain Loc      Pain Edu?      Excl. in GC?     Constitutional: Alert and oriented. Well appearing and in no acute distress. Eyes: Conjunctivae are normal.  Head: Atraumatic. Mouth/Throat: Mucous membranes are moist. Oropharynx non-erythematous. Neck: No stridor.  Cardiovascular: Normal rate, regular rhythm. Good peripheral circulation. Grossly normal heart sounds. Respiratory: Normal respiratory effort.  No retractions. Lungs CTAB. Gastrointestinal: Soft and nontender. No distention.  Musculoskeletal: No lower  extremity tenderness nor edema. No gross deformities of extremities. Neurologic:  Normal speech and language. No gross focal neurologic deficits are appreciated.  Skin:  Skin is warm, dry and intact. No rash noted. Psychiatric: Mood and affect are normal. Speech and behavior are normal.  ____________________________________________   LABS (all labs ordered are listed, but only abnormal results are displayed)  Labs Reviewed  COMPREHENSIVE METABOLIC PANEL - Abnormal; Notable for the following:       Result Value   ALT 15 (*)    All other components within normal limits  ACETAMINOPHEN LEVEL - Abnormal; Notable for the following:    Acetaminophen (Tylenol), Serum <10 (*)    All other components within normal limits  ETHANOL  SALICYLATE LEVEL  CBC  TROPONIN I  URINE DRUG SCREEN, QUALITATIVE ([facility] ONLY)   ____________________________________________  EKG  ED ECG REPORT I, [place] N BROWN, the attending physician, personally viewed and interpreted this ECG.   Date: 08/06/2017  EKG Time: 10:26 PM  Rate: 73  Rhythm: normal sinus rhythm  Axis: normal  Intervals:normal  ST&T Change: none chills  ____________________________________________  RADIOLOGY I, [place] N BROWN, personally viewed and evaluated these images (plain radiographs) as part of my medical decision making, as well as reviewing the written report by the radiologist.  Dg Chest 2 View  Result Date: 08/05/2017 CLINICAL DATA:  Shortness of breath. EXAM: CHEST  2 VIEW COMPARISON:  Radiographs 07/29/2017 FINDINGS: Mild bronchial thickening. The cardiomediastinal contours are normal. The lungs are clear. Pulmonary vasculature is normal. No consolidation, pleural effusion, or pneumothorax. No acute osseous abnormalities are seen. IMPRESSION: Mild bronchial thickening can be seen with asthma or bronchitis. Electronically Signed   By: Rubye Oaks M.D.   On: 08/05/2017 23:22      Procedures   ____________________________________________   INITIAL IMPRESSION / ASSESSMENT AND PLAN / ED COURSE  Pertinent labs & imaging results that were available during my care of the patient were reviewed by me and considered in my medical decision making (see chart for details).   31 year old presenting with above stated history of physical exam consistent with possible bronchitis versus pneumonia versus viral URI. Chest x-ray revealed no evidence of pneumonia however consistent with bronchitis and a such patient will be given azithromycin and Tessalon Perles as he is a smoker. again patient denies any suicidal or homicidal ideation. Patient denies any visual or auditory hallucinations    ____________________________________________  FINAL CLINICAL IMPRESSION(S) / ED DIAGNOSES  Final diagnoses:  Acute bronchitis, unspecified organism     MEDICATIONS GIVEN DURING THIS VISIT:  Medications - No data to display   NEW OUTPATIENT MEDICATIONS STARTED DURING THIS VISIT:  New Prescriptions   No medications on file    Modified Medications   No medications on file    Discontinued Medications   No medications on file     Note:  This document was prepared using Dragon voice recognition software and may include unintentional dictation errors.    Manson Passey,  Enedina Finner, MD 08/06/17 [phone]

## 2017-08-06 NOTE — ED Notes (Signed)
ED Provider at bedside. 

## 2017-08-06 NOTE — ED Notes (Signed)
Attempted to call patient's mother for transportation home, and phone number was unavailable.  Then tried to call patient's previous care coordinator and left VM for return call. MD made aware.

## 2017-08-06 NOTE — ED Notes (Signed)
Patient falling asleep while being assessed by the physician and during nursing rounds.

## 2017-09-23 ENCOUNTER — Encounter: Payer: Self-pay | Admitting: Emergency Medicine

## 2017-09-23 ENCOUNTER — Emergency Department
Admission: EM | Admit: 2017-09-23 | Discharge: 2017-09-24 | Disposition: A | Discharge status: Home / Self Care | Payer: Medicare Other | Attending: Emergency Medicine | Admitting: Emergency Medicine

## 2017-09-23 ENCOUNTER — Other Ambulatory Visit: Payer: Self-pay

## 2017-09-23 DIAGNOSIS — F203 Undifferentiated schizophrenia: Secondary | ICD-10-CM | POA: Diagnosis present

## 2017-09-23 DIAGNOSIS — F209 Schizophrenia, unspecified: Secondary | ICD-10-CM | POA: Insufficient documentation

## 2017-09-23 DIAGNOSIS — F17210 Nicotine dependence, cigarettes, uncomplicated: Secondary | ICD-10-CM | POA: Diagnosis not present

## 2017-09-23 DIAGNOSIS — Z79899 Other long term (current) drug therapy: Secondary | ICD-10-CM | POA: Insufficient documentation

## 2017-09-23 DIAGNOSIS — R442 Other hallucinations: Secondary | ICD-10-CM | POA: Insufficient documentation

## 2017-09-23 DIAGNOSIS — F410 Panic disorder [episodic paroxysmal anxiety] without agoraphobia: Secondary | ICD-10-CM | POA: Diagnosis present

## 2017-09-23 DIAGNOSIS — Z046 Encounter for general psychiatric examination, requested by authority: Secondary | ICD-10-CM | POA: Diagnosis present

## 2017-09-23 DIAGNOSIS — F1220 Cannabis dependence, uncomplicated: Secondary | ICD-10-CM | POA: Diagnosis present

## 2017-09-23 LAB — URINE DRUG SCREEN, QUALITATIVE (ARMC ONLY)
AMPHETAMINES, UR SCREEN: NOT DETECTED
BARBITURATES, UR SCREEN: NOT DETECTED
Benzodiazepine, Ur Scrn: NOT DETECTED
COCAINE METABOLITE, UR ~~LOC~~: NOT DETECTED
Cannabinoid 50 Ng, Ur ~~LOC~~: POSITIVE — AB
MDMA (Ecstasy)Ur Screen: NOT DETECTED
METHADONE SCREEN, URINE: NOT DETECTED
OPIATE, UR SCREEN: NOT DETECTED
Phencyclidine (PCP) Ur S: NOT DETECTED
Tricyclic, Ur Screen: NOT DETECTED

## 2017-09-23 LAB — COMPREHENSIVE METABOLIC PANEL
ALBUMIN: 5 g/dL (ref 3.5–5.0)
ALT: 20 U/L (ref 17–63)
AST: 30 U/L (ref 15–41)
Alkaline Phosphatase: 74 U/L (ref 38–126)
Anion gap: 12 (ref 5–15)
BUN: 13 mg/dL (ref 6–20)
CALCIUM: 9.4 mg/dL (ref 8.9–10.3)
CO2: 26 mmol/L (ref 22–32)
Chloride: 100 mmol/L — ABNORMAL LOW (ref 101–111)
Creatinine, Ser: 0.94 mg/dL (ref 0.61–1.24)
GFR calc Af Amer: >60 mL/min (ref >60)
GFR calc non Af Amer: >60 mL/min (ref >60)
GLUCOSE: 116 mg/dL — AB (ref 65–99)
Potassium: 3.4 mmol/L — ABNORMAL LOW (ref 3.5–5.1)
SODIUM: 138 mmol/L (ref 135–145)
Total Bilirubin: 0.6 mg/dL (ref 0.3–1.2)
Total Protein: 7.8 g/dL (ref 6.5–8.1)

## 2017-09-23 LAB — CBC
HEMATOCRIT: 49.5 % (ref 40.0–52.0)
HEMOGLOBIN: 16.7 g/dL (ref 13.0–18.0)
MCH: 30.7 pg (ref 26.0–34.0)
MCHC: 33.7 g/dL (ref 32.0–36.0)
MCV: 91 fL (ref 80.0–100.0)
Platelets: 276 10*3/uL (ref 150–440)
RBC: 5.44 MIL/uL (ref 4.40–5.90)
RDW: 13.2 % (ref 11.5–14.5)
WBC: 9 10*3/uL (ref 3.8–10.6)

## 2017-09-23 LAB — SALICYLATE LEVEL: Salicylate Lvl: <7 mg/dL (ref 2.8–30.0)

## 2017-09-23 LAB — ETHANOL: Alcohol, Ethyl (B): <10 mg/dL (ref <10)

## 2017-09-23 LAB — ACETAMINOPHEN LEVEL

## 2017-09-23 NOTE — ED Notes (Signed)
BEHAVIORAL HEALTH ROUNDING  Patient sleeping: No.  Patient alert and oriented: yes  Behavior appropriate: Yes. ; If no, describe:  Nutrition and fluids offered: Yes  Toileting and hygiene offered: Yes  Sitter present: not applicable, Q 15 min safety rounds and observation.  Law enforcement present: Yes ODS  

## 2017-09-23 NOTE — ED Provider Notes (Signed)
[facility] Emergency Department Provider Note ____________________________________________   First MD Initiated Contact with Patient 09/23/17 1625     (approximate)  I have reviewed the triage vital signs and the nursing notes.   HISTORY  Chief Complaint Psychiatric Evaluation    HPI Edwin Montes is a 31 y.o. male presents with hallucinations, occurring over the last several days, described mainly as seeing shadows, and associated with increased anxiety.  Patient states that he is compliant with psychiatric medications, but was discontinued on some of them last week.  Patient denies SI or HI.  He denies auditory hallucinations.  He reports mild shortness of breath, but he states that this is associated with his anxiety and is not present currently.  He denies chest pain, cough, or fever.  Past Medical History:  Diagnosis Date  . Anxiety   . Schizophrenia [facility](HCC)     Patient Active Problem List   Diagnosis Date Noted  . 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 Cannabis use disorder, moderate, dependence (HCC) 11/03/2016  . Panic attack 06/23/2016  . Noncompliance 02/29/2016  . Tobacco use disorder 01/27/2016    Past Surgical History:  Procedure Laterality Date  . NO PAST SURGERIES      Prior to Admission medications   Medication Sig Start Date End Date Taking? Authorizing Provider  ARIPiprazole (ABILIFY) 20 MG tablet Take 1 tablet (20 mg total) by mouth daily. 07/02/17   Pucilowska, Braulio ConteJolanta B, MD  ARIPiprazole ER 400 MG SUSR Inject 400 mg into the muscle every 28 (twenty-eight) days. Due on September 18. 07/02/17   Pucilowska, Braulio ConteJolanta B, MD  benzonatate (TESSALON PERLES) 100 MG capsule Take 1 capsule (100 mg total) by mouth 3 (three) times daily as needed for cough. 08/06/17 08/06/18  Darci CurrentBrown, [place] N, MD  fluvoxaMINE (LUVOX) 100 MG tablet Take 1 tablet (100 mg total) by mouth at bedtime. 07/02/17   Pucilowska, Braulio ConteJolanta B, MD    haloperidol (HALDOL) 10 MG tablet Take 1 tablet (10 mg total) by mouth at bedtime. 07/02/17   Pucilowska, Braulio ConteJolanta B, MD  haloperidol decanoate (HALDOL DECANOATE) 100 MG/ML injection Inject 1 mL (100 mg total) into the muscle every 28 (twenty-eight) days. Next dose on September 18. 07/27/17   Pucilowska, Jolanta B, MD  traZODone (DESYREL) 100 MG tablet Take 1 tablet (100 mg total) by mouth at bedtime. 07/02/17   Shari ProwsPucilowska, Jolanta B, MD    Allergies Patient has no known allergies.  History reviewed. No pertinent family history.  Social History Social History   Tobacco Use  . Smoking status: Current Some Day Smoker    Packs/day: 0.50    Years: 1.00    Pack years: 0.50    Types: Cigarettes  . Smokeless tobacco: Never Used  Substance Use Topics  . Alcohol use: No  . Drug use: Yes    Types: Other-see comments    Comment: Cannibis    Review of Systems  Constitutional: No fever/chills Eyes: Positive for "seeing shadows." ENT: No sore throat. Cardiovascular: Denies chest pain. Respiratory: Positive for mild shortness of breath. Gastrointestinal: No nausea, no vomiting.  No diarrhea.  Genitourinary: Negative for dysuria.  Musculoskeletal: Negative for back pain. Skin: Negative for rash. Neurological: Negative for headaches, focal weakness or numbness.   ____________________________________________   PHYSICAL EXAM:  VITAL SIGNS: ED Triage Vitals [09/23/17 1605]  Enc Vitals Group     BP 137/89     Pulse Rate 94     Resp 18  Temp 98.3 F (36.8 C)     Temp Source Oral     SpO2 98 %     Weight 223 lb (101.2 kg)     Height 6\' 2"  (1.88 m)     Head Circumference      Peak Flow      Pain Score      Pain Loc      Pain Edu?      Excl. in GC?     Constitutional: Alert and oriented. Well appearing and in no acute distress. Eyes: Conjunctivae are normal.  Head: Atraumatic. Nose: No congestion/rhinnorhea. Mouth/Throat: Mucous membranes are moist.   Neck: Normal range  of motion.  Cardiovascular: Normal rate, regular rhythm. Grossly normal heart sounds.  Good peripheral circulation. Respiratory: Normal respiratory effort.  No retractions. Lungs CTAB. Gastrointestinal: No distention.  Musculoskeletal: No lower extremity edema.  Extremities warm and well perfused.  Neurologic:  Normal speech and language. No gross focal neurologic deficits are appreciated.  Skin:  Skin is warm and dry. No rash noted.  Psychiatric: Flat affect. Speech and behavior are normal.  ____________________________________________   LABS (all labs ordered are listed, but only abnormal results are displayed)  Labs Reviewed  COMPREHENSIVE METABOLIC PANEL - Abnormal; Notable for the following components:      Result Value   Potassium 3.4 (*)    Chloride 100 (*)    Glucose, Bld 116 (*)    All other components within normal limits  ACETAMINOPHEN LEVEL - Abnormal; Notable for the following components:   Acetaminophen (Tylenol), Serum <10 (*)    All other components within normal limits  URINE DRUG SCREEN, QUALITATIVE ([facility] ONLY) - Abnormal; Notable for the following components:   Cannabinoid 50 Ng, Ur  POSITIVE (*)    All other components within normal limits  ETHANOL  SALICYLATE LEVEL  CBC   ____________________________________________  EKG   ____________________________________________  RADIOLOGY    ____________________________________________   PROCEDURES  Procedure(s) performed: No    Critical Care performed: No ____________________________________________   INITIAL IMPRESSION / ASSESSMENT AND PLAN / ED COURSE  Pertinent labs & imaging results that were available during my care of the patient were reviewed by me and considered in my medical decision making (see chart for details).  31 year old male with a history of schizophrenia and anxiety presents with increased anxiety, report of visual hallucinations, and mild shortness of breath that he states  is associated with anxiety.  Review of past medical records in Epic is noncontributory.  On exam, patient has slightly flat affect.  The remainder the exam is unremarkable.  Lungs are clear and heart sounds are normal.  Will obtain basic labs, but there is no evidence of acute cardiopulmonary cause of the shortness of breath.  It is consistent with anxiety.  Patient at this time is voluntarily in the ED and does not demonstrate acute danger to self or others.  Plan: labs for medical clearance, SOC and TTS consults, and reassess.    ----------------------------------------- 10:48 PM on 09/23/2017 -----------------------------------------  Lab workup unremarkable.  SOC evaluated the patient and recommends inpatient admission.  ____________________________________________   FINAL CLINICAL IMPRESSION(S) / ED DIAGNOSES  Final diagnoses:  Schizophrenia, unspecified type (HCC)      NEW MEDICATIONS STARTED DURING THIS VISIT:  This SmartLink is deprecated. Use AVSMEDLIST instead to display the medication list for a patient.   Note:  This document was prepared using Dragon voice recognition software and may include unintentional dictation errors.    Tawnie Ehresman,  Wilmon PaliSebastian, MD 09/23/17 2248

## 2017-09-23 NOTE — ED Notes (Signed)

## 2017-09-23 NOTE — ED Notes (Signed)
Report to SOC MD 

## 2017-09-23 NOTE — ED Triage Notes (Signed)
Pt presents to ED via [facility] from home c/o auditory and visual hallucinations. Pt states he was taking Fluvox, Haldol, and Trazodone as well as Abilify injections for 9 months. Patient reports his psychiatrist felt that he was stable on the Abilify alone and discontinued the other medications this past Wednesday. When asked about specifics pt states he "sees shadows everywhere." Denies SI or HI. Denies drug or alcohol use. Pt cooperative with triage and dress out process but appears reluctant to make eye contact and is blinking rapidly.

## 2017-09-23 NOTE — BH Assessment (Signed)
Assessment Note  Edwin Montes is an 31 y.o. male who presents to the ER due to seeing shadows and other things, no one else seeing. Patient states, it's occurred throughout the day and it's increasing. He also states, having a history of V/H but he is unable to manage them at this time. He reports of having an increase of anxiety and fear as well.  Patient denies SI/HI and A/H.  He currently receives outpatient treatment with [facility]. He is under the psychiatric care of Dr. Georjean ModeLitz. He reports being compliant with his medications and treatment.  During the interview, the patient was cooperative and pleasant. While answering questions, he would pause before talking and extended pauses between thoughts. Patient is well known to the ER due to similar presentations.  He have no known history of violence or aggression. He have no known history of mind-altering substances use. Patient denies any involvement with legal system.   Diagnosis: Schizophrenia  Past Medical History:  Past Medical History:  Diagnosis Date  . Anxiety   . Schizophrenia [facility](HCC)     Past Surgical History:  Procedure Laterality Date  . NO PAST SURGERIES      Family History: History reviewed. No pertinent family history.  Social History:  reports that he has been smoking cigarettes.  He has a 0.50 pack-year smoking history. he has never used smokeless tobacco. He reports that he uses drugs. Drug: Other-see comments. He reports that he does not drink alcohol.  Additional Social History:  Alcohol / Drug Use Pain Medications: See PTA Prescriptions: See PTA Over the Counter: See PTA History of alcohol / drug use?: No history of alcohol / drug abuse Longest period of sobriety (when/how long): Reports of none Negative Consequences of Use: (n/a)  CIWA: CIWA-Ar BP: 137/89 Pulse Rate: 94 COWS:    Allergies: No Known Allergies  Home Medications:  (Not in a hospital admission)  OB/GYN Status:  No LMP for male  patient.  General Assessment Data Assessment unable to be completed: Yes [name]: In system Is this a Tele or Face-to-Face Assessment?: Face-to-Face Is this an Initial Assessment or a Re-assessment for this encounter?: Initial Assessment Marital status: Single Maiden name: n/a Is patient pregnant?: No Pregnancy Status: No Living Arrangements: Alone Can pt return to current living arrangement?: Yes Admission Status: Voluntary Is patient capable of signing voluntary admission?: Yes Referral Source: Self/Family/Friend Insurance type: Medicare  Medical Screening Exam [facility] ONLY) Medical Exam completed: Yes  Crisis Care Plan Living Arrangements: Alone Legal Guardian: Other:(Self) Name of Psychiatrist: Dr. [name]([facility]) Name of Therapist: [facility]  Education Status Is patient currently in school?: No Current Grade: n/a Highest grade of school patient has completed: [employer] Name of school: n/a Contact person: n/a  Risk to self with the past 6 months Suicidal Ideation: No Has patient been a risk to self within the past 6 months prior to admission? : No Suicidal Intent: No Has patient had any suicidal intent within the past 6 months prior to admission? : No Is patient at risk for suicide?: No Suicidal Plan?: No Has patient had any suicidal plan within the past 6 months prior to admission? : No Access to Means: No What has been your use of drugs/alcohol within the last 12 months?: Reports of none Previous Attempts/Gestures: No How many times?: 0 Other Self Harm Risks: Reports none Triggers for Past Attempts: None known Intentional Self Injurious Behavior: None Family Suicide History: Unknown Recent stressful life event(s):  Other (Comment)(V/H) Persecutory voices/beliefs?: No Depression: Yes Depression Symptoms: Isolating, Feeling worthless/self pity Substance abuse history and/or treatment for substance abuse?: No Suicide  prevention information given to non-admitted patients: Not applicable  Risk to Others within the past 6 months Homicidal Ideation: No Does patient have any lifetime risk of violence toward others beyond the six months prior to admission? : No Thoughts of Harm to Others: No Current Homicidal Intent: No Current Homicidal Plan: No Access to Homicidal Means: No Identified Victim: Reports of none History of harm to others?: No Assessment of Violence: None Noted Violent Behavior Description: Reports of none Does patient have access to weapons?: No Criminal Charges Pending?: No Does patient have a court date: No Is patient on probation?: No  Psychosis Hallucinations: Visual Delusions: (Some Paranoia)  Mental Status Report Appearance/Hygiene: Unremarkable, In scrubs Eye Contact: Good Motor Activity: Freedom of movement, Unremarkable Speech: Logical/coherent, Soft, Slow Level of Consciousness: Alert Mood: Pleasant Affect: Flat Anxiety Level: None(Per the patient's report) Thought Processes: Coherent, Thought Blocking, Relevant Judgement: Partial Orientation: Person, Place, Time, Situation, Appropriate for developmental age Obsessive Compulsive Thoughts/Behaviors: Minimal  Cognitive Functioning Concentration: Normal Memory: Recent Intact, Remote Intact IQ: Average Insight: Fair Impulse Control: Fair Appetite: Good Weight Loss: 0 Weight Gain: 0 Sleep: No Change Total Hours of Sleep: 8 Vegetative Symptoms: None  ADLScreening [facility] Assessment Services) Patient's cognitive ability adequate to safely complete daily activities?: Yes Patient able to express need for assistance with ADLs?: Yes Independently performs ADLs?: Yes (appropriate for developmental age)  Prior Inpatient Therapy Prior Inpatient Therapy: Yes Prior Therapy Dates: Multiple Hospitalizations Prior Therapy Facilty/Provider(s): [facility] Reason for Treatment: Schizophrenia   Prior Outpatient Therapy Prior  Outpatient Therapy: Yes Prior Therapy Dates: Current Prior Therapy Facilty/Provider(s): [facility] Reason for Treatment: Schizophrenia  Does patient have an ACCT team?: No Does patient have Intensive In-House Services?  : No Does patient have [facility] services? : No Does patient have P4CC services?: No  ADL Screening (condition at time of admission) Patient's cognitive ability adequate to safely complete daily activities?: Yes Is the patient deaf or have difficulty hearing?: No Does the patient have difficulty seeing, even when wearing glasses/contacts?: No Does the patient have difficulty concentrating, remembering, or making decisions?: No Patient able to express need for assistance with ADLs?: Yes Does the patient have difficulty dressing or bathing?: No Independently performs ADLs?: Yes (appropriate for developmental age) Does the patient have difficulty walking or climbing stairs?: No Weakness of Legs: None Weakness of Arms/Hands: None  Home Assistive Devices/Equipment Home Assistive Devices/Equipment: None  Therapy Consults (therapy consults require a physician order) PT Evaluation Needed: No OT Evalulation Needed: No SLP Evaluation Needed: No   Values / Beliefs Cultural Requests During Hospitalization: None Spiritual Requests During Hospitalization: None Consults Spiritual Care Consult Needed: No Social Work Consult Needed: No Merchant navy officerAdvance Directives (For Healthcare) Does Patient Have a Medical Advance Directive?: No Would patient like information on creating a medical advance directive?: No - Patient declined    Additional Information 1:1 In Past 12 Months?: No CIRT Risk: No Elopement Risk: No Does patient have medical clearance?: Yes  Child/Adolescent Assessment Running Away Risk: Denies(Patient is an adult)  Disposition:  Disposition Initial Assessment Completed for this Encounter: Yes Disposition of Patient: Pending Review with psychiatrist  On Site Evaluation by:    Reviewed with Physician:    Lilyan Gilfordalvin J. Daymien Goth MS, LCAS, LPC, NCC, CCSI Therapeutic Triage Specialist 09/23/2017 6:55 PM

## 2017-09-23 NOTE — ED Notes (Signed)
Pt given supper tray.

## 2017-09-24 DIAGNOSIS — F203 Undifferentiated schizophrenia: Secondary | ICD-10-CM

## 2017-09-24 NOTE — ED Notes (Signed)
Patient alert and oriented. AVS/Follow-up care reviewed with patient and written copy given. Patient verbalized understanding. All belongings returned to patient. Patient vitals at discharge 98.1-132/[id number]% room air. Patient aware of number to call if problems. Patient escorted to lobby by this Clinical research associatewriter.

## 2017-09-24 NOTE — BH Assessment (Signed)
Psychiatric consult interview is completed. Per Dr.Clapacs pt is recommended for discharge.  

## 2017-09-24 NOTE — Consult Note (Signed)
[place] [facility] Consult   Reason for Consult: Consult for 31 year old man with schizophrenia who came to the emergency room initially complaining of mostly visual hallucinations Referring Physician: McShane Patient Identification: Edwin Montes MRN:  [id number] Principal Diagnosis: Undifferentiated schizophrenia [facility]) Diagnosis:   Patient Active Problem List   Diagnosis Date Noted  . Undifferentiated schizophrenia ([place]) [F20.3] 03/24/2017  . Cannabis use disorder, moderate, dependence ([place]) [F12.20] 11/03/2016  . Panic attack [F41.0] 06/23/2016  . Noncompliance [Z91.19] 02/29/2016  . Tobacco use disorder [F17.200] 01/27/2016    Total Time spent with patient: 1 hour  Subjective:   Edwin Montes is a 31 y.o. male patient admitted with "anxiety".  HPI: Patient interviewed chart reviewed.  31 year old man with a history of schizophrenia.  He came to the emergency room over the weekend saying that he was feeling anxious and complaining of visual hallucinations.  He did not report suicidal ideation or homicidal ideation and his behavior has been cooperative.  On interview today the patient tells me that he was just having a lot of anxiety.  When pressed on it he admits that he was having visual hallucinations of shadowy things but says they are not happening anymore and he is not worried about them.  His sleep has been a little bit disrupted.  He denies feeling like he has been under a lot of stress or that any major stressors have changed in his life although he claims that he was recently taken off of some of his psychiatric medicine and just left on long-acting Abilify.  Patient denies suicidal or homicidal thoughts.  He denies drug use although his drug screen is positive for cannabis.  Admits that his anxiety attack probably contributed to coming into the emergency room.  Social history: Lives in a group home.  Has a guardian.  Medical history: Outside of mental health  issues no major ongoing medical issues  Substance abuse history: Intermittent abuse of cannabis which clearly worsens his symptoms  Past Psychiatric History: Multiple presentations to the emergency room.  Some prior hospitalizations.  Mostly stays stable on chronic antipsychotics.  No history of suicide attempts or violence in the past.  Anxiety attacks come and go usually very self-limited  Risk to Self: Suicidal Ideation: No Suicidal Intent: No Is patient at risk for suicide?: No Suicidal Plan?: No Access to Means: No What has been your use of drugs/alcohol within the last 12 months?: Reports of none How many times?: 0 Other Self Harm Risks: Reports none Triggers for Past Attempts: None known Intentional Self Injurious Behavior: None Risk to Others: Homicidal Ideation: No Thoughts of Harm to Others: No Current Homicidal Intent: No Current Homicidal Plan: No Access to Homicidal Means: No Identified Victim: Reports of none History of harm to others?: No Assessment of Violence: None Noted Violent Behavior Description: Reports of none Does patient have access to weapons?: No Criminal Charges Pending?: No Does patient have a court date: No Prior Inpatient Therapy: Prior Inpatient Therapy: Yes Prior Therapy Dates: Multiple Hospitalizations Prior Therapy Facilty/Provider(s): [facility] Reason for Treatment: Schizophrenia  Prior Outpatient Therapy: Prior Outpatient Therapy: Yes Prior Therapy Dates: Current Prior Therapy Facilty/Provider(s): [facility] Reason for Treatment: Schizophrenia  Does patient have an ACCT team?: No Does patient have Intensive In-House Services?  : No Does patient have [facility] services? : No Does patient have P4CC services?: No  Past Medical History:  Past Medical History:  Diagnosis Date  . Anxiety   . Schizophrenia ([place])  Past Surgical History:  Procedure Laterality Date  . NO PAST SURGERIES     Family History: History reviewed. No pertinent family  history. Family Psychiatric  History: No known family history Social History:  Social History   Substance and Sexual Activity  Alcohol Use No     Social History   Substance and Sexual Activity  Drug Use Yes  . Types: Other-see comments   Comment: Cannibis    Social History   Socioeconomic History  . Marital status: Single    Spouse name: None  . Number of children: None  . Years of education: None  . Highest education level: None  Social Needs  . Financial resource strain: None  . Food insecurity - worry: None  . Food insecurity - inability: None  . Transportation needs - medical: None  . Transportation needs - non-medical: None  Occupational History  . None  Tobacco Use  . Smoking status: Current Some Day Smoker    Packs/day: 0.50    Years: 1.00    Pack years: 0.50    Types: Cigarettes  . Smokeless tobacco: Never Used  Substance and Sexual Activity  . Alcohol use: No  . Drug use: Yes    Types: Other-see comments    Comment: Cannibis  . Sexual activity: Not Currently  Other Topics Concern  . None  Social History Narrative  . None   Additional Social History:    Allergies:  No Known Allergies  Labs:  Results for orders placed or performed during the hospital encounter of 09/23/17 (from the past 48 hour(s))  Comprehensive metabolic panel     Status: Abnormal   Collection Time: 09/23/17  4:03 PM  Result Value Ref Range   Sodium 138 135 - 145 mmol/L   Potassium 3.4 (L) 3.5 - 5.1 mmol/L   Chloride 100 (L) 101 - 111 mmol/L   CO2 26 22 - 32 mmol/L   Glucose, Bld 116 (H) 65 - 99 mg/dL   BUN 13 6 - 20 mg/dL   Creatinine, Ser 0.94 0.61 - 1.24 mg/dL   Calcium 9.4 8.9 - 10.3 mg/dL   Total Protein 7.8 6.5 - 8.1 g/dL   Albumin 5.0 3.5 - 5.0 g/dL   AST 30 15 - 41 U/L   ALT 20 17 - 63 U/L   Alkaline Phosphatase 74 38 - 126 U/L   Total Bilirubin 0.6 0.3 - 1.2 mg/dL   GFR calc non Af Amer >60 >60 mL/min   GFR calc Af Amer >60 >60 mL/min    Comment: (NOTE) The  eGFR has been calculated using the CKD EPI equation. This calculation has not been validated in all clinical situations. eGFR's persistently <60 mL/min signify possible Chronic Kidney Disease.    Anion gap 12 5 - 15  Ethanol     Status: None   Collection Time: 09/23/17  4:03 PM  Result Value Ref Range   Alcohol, Ethyl (B) <10 <10 mg/dL    Comment:        LOWEST DETECTABLE LIMIT FOR SERUM ALCOHOL IS 10 mg/dL FOR MEDICAL PURPOSES ONLY   Salicylate level     Status: None   Collection Time: 09/23/17  4:03 PM  Result Value Ref Range   Salicylate Lvl <1.1 2.8 - 30.0 mg/dL  Acetaminophen level     Status: Abnormal   Collection Time: 09/23/17  4:03 PM  Result Value Ref Range   Acetaminophen (Tylenol), Serum <10 (L) 10 - 30 ug/mL    Comment:  THERAPEUTIC CONCENTRATIONS VARY SIGNIFICANTLY. A RANGE OF 10-30 ug/mL MAY BE AN EFFECTIVE CONCENTRATION FOR MANY PATIENTS. HOWEVER, SOME ARE BEST TREATED AT CONCENTRATIONS OUTSIDE THIS RANGE. ACETAMINOPHEN CONCENTRATIONS >150 ug/mL AT 4 HOURS AFTER INGESTION AND >50 ug/mL AT 12 HOURS AFTER INGESTION ARE OFTEN ASSOCIATED WITH TOXIC REACTIONS.   cbc     Status: None   Collection Time: 09/23/17  4:03 PM  Result Value Ref Range   WBC 9.0 3.8 - 10.6 K/uL   RBC 5.44 4.40 - 5.90 MIL/uL   Hemoglobin 16.7 13.0 - 18.0 g/dL   HCT 49.5 40.0 - 52.0 %   MCV 91.0 80.0 - 100.0 fL   MCH 30.7 26.0 - 34.0 pg   MCHC 33.7 32.0 - 36.0 g/dL   RDW 13.2 11.5 - 14.5 %   Platelets 276 150 - 440 K/uL  Urine Drug Screen, Qualitative     Status: Abnormal   Collection Time: 09/23/17  4:03 PM  Result Value Ref Range   Tricyclic, Ur Screen NONE DETECTED NONE DETECTED   Amphetamines, Ur Screen NONE DETECTED NONE DETECTED   MDMA (Ecstasy)Ur Screen NONE DETECTED NONE DETECTED   Cocaine Metabolite,Ur [place] NONE DETECTED NONE DETECTED   Opiate, Ur Screen NONE DETECTED NONE DETECTED   Phencyclidine (PCP) Ur S NONE DETECTED NONE DETECTED   Cannabinoid 50 Ng, Ur [place]  POSITIVE (A) NONE DETECTED   Barbiturates, Ur Screen NONE DETECTED NONE DETECTED   Benzodiazepine, Ur Scrn NONE DETECTED NONE DETECTED   Methadone Scn, Ur NONE DETECTED NONE DETECTED    Comment: (NOTE) 027  Tricyclics, urine               Cutoff 1000 ng/mL 200  Amphetamines, urine             Cutoff 1000 ng/mL 300  MDMA (Ecstasy), urine           Cutoff 500 ng/mL 400  Cocaine Metabolite, urine       Cutoff 300 ng/mL 500  Opiate, urine                   Cutoff 300 ng/mL 600  Phencyclidine (PCP), urine      Cutoff 25 ng/mL 700  Cannabinoid, urine              Cutoff 50 ng/mL 800  Barbiturates, urine             Cutoff 200 ng/mL 900  Benzodiazepine, urine           Cutoff 200 ng/mL 1000 Methadone, urine                Cutoff 300 ng/mL 1100 1200 The urine drug screen provides only a preliminary, unconfirmed 1300 analytical test result and should not be used for non-medical 1400 purposes. Clinical consideration and professional judgment should 1500 be applied to any positive drug screen result due to possible 1600 interfering substances. A more specific alternate chemical method 1700 must be used in order to obtain a confirmed analytical result.  1800 Gas chromato graphy / mass spectrometry (GC/MS) is the preferred 1900 confirmatory method.     No current facility-administered medications for this encounter.    Current Outpatient Medications  Medication Sig Dispense Refill  . ARIPiprazole (ABILIFY) 20 MG tablet Take 1 tablet (20 mg total) by mouth daily. 30 tablet 1  . ARIPiprazole ER 400 MG SUSR Inject 400 mg into the muscle every 28 (twenty-eight) days. Due on September 18. 1 each 1  . benzonatate (  TESSALON PERLES) 100 MG capsule Take 1 capsule (100 mg total) by mouth 3 (three) times daily as needed for cough. 30 capsule 0  . fluvoxaMINE (LUVOX) 100 MG tablet Take 1 tablet (100 mg total) by mouth at bedtime. 30 tablet 1  . haloperidol (HALDOL) 10 MG tablet Take 1 tablet (10 mg total)  by mouth at bedtime. 30 tablet 1  . haloperidol decanoate (HALDOL DECANOATE) 100 MG/ML injection Inject 1 mL (100 mg total) into the muscle every 28 (twenty-eight) days. Next dose on September 18. 1 mL 1  . traZODone (DESYREL) 100 MG tablet Take 1 tablet (100 mg total) by mouth at bedtime. 30 tablet 1    Musculoskeletal: Strength & Muscle Tone: within normal limits Gait & Station: normal Patient leans: N/A  Psychiatric Specialty Exam: Physical Exam  Nursing note and vitals reviewed. Constitutional: He appears well-developed and well-nourished.  HENT:  Head: Normocephalic and atraumatic.  Eyes: Conjunctivae are normal. Pupils are equal, round, and reactive to light.  Neck: Normal range of motion.  Cardiovascular: Regular rhythm and normal heart sounds.  Respiratory: Effort normal and breath sounds normal. No respiratory distress.  GI: Soft. He exhibits no distension.  Musculoskeletal: Normal range of motion.  Neurological: He is alert.  Skin: Skin is warm and dry.  Psychiatric: His affect is blunt. His speech is delayed. He is slowed. Thought content is not paranoid. Cognition and memory are normal. He expresses impulsivity. He expresses no homicidal and no suicidal ideation.    Review of Systems  Constitutional: Negative.   HENT: Negative.   Eyes: Negative.   Respiratory: Negative.   Cardiovascular: Negative.   Gastrointestinal: Negative.   Musculoskeletal: Negative.   Skin: Negative.   Neurological: Negative.   Psychiatric/Behavioral: Positive for hallucinations. Negative for depression, memory loss, substance abuse and suicidal ideas. The patient is not nervous/anxious and does not have insomnia.     Blood pressure 132/79, pulse 90, temperature 98.1 F (36.7 C), temperature source Oral, resp. rate 18, height '6\' 2"'  (1.88 m), weight 101.2 kg (223 lb), SpO2 100 %.Body mass index is 28.63 kg/m.  General Appearance: Casual  Eye Contact:  Minimal  Speech:  Slow  Volume:   Decreased  Mood:  Dysphoric  Affect:  Constricted  Thought Process:  Coherent  Orientation:  Full (Time, Place, and Person)  Thought Content:  Logical  Suicidal Thoughts:  No  Homicidal Thoughts:  No  Memory:  Immediate;   Good Recent;   Fair Remote;   Fair  Judgement:  Fair  Insight:  Fair  Psychomotor Activity:  Decreased  Concentration:  Concentration: Fair  Recall:  [employer] of Knowledge:  Fair  Language:  Fair  Akathisia:  No  Handed:  Right  AIMS (if indicated):     Assets:  Desire for Improvement Housing Physical Health  ADL's:  Intact  Cognition:  Impaired,  Mild  Sleep:        Treatment Plan Summary: Plan 31 year old man with schizophrenia.  Symptoms of schizophrenia appear to be at their baseline.  Current behaviors consistent with what we have seen in the past.  Intermittent panic attacks which have been only partially responsive to medicine.  Did some coaching with him around the treatment of those.  Reviewed old notes about medication.  Encourage patient to abstain from cannabis.  Patient can be taken off commitment and released from the emergency room to be discharged back to his group home for outpatient follow-up  Disposition: Patient does not meet  criteria for psychiatric inpatient admission. Supportive therapy provided about ongoing stressors.  Alethia Berthold, MD 09/24/2017 2:48 PM

## 2017-09-24 NOTE — ED Notes (Signed)
Patient is alert and verbal. Patient states he was having auditory and visual hallucinations when he came to hospital. Patient denies any symptoms at this time. Patient denies SI/HI. Patient denies pain. Patient is comfortable and resting and voices no concerns. Q 15 minute checks in progress and patient remains safe on unit.

## 2017-09-24 NOTE — ED Provider Notes (Signed)
Patient was medically cleared prior to my arrival, patient was released from IVC by Dr. Mat Carnelay packs, we will discharge.  Contract for safety   Jeanmarie PlantMcShane, Indiya Izquierdo A, MD 09/24/17 1233

## 2017-10-27 ENCOUNTER — Other Ambulatory Visit: Payer: Self-pay

## 2017-10-27 ENCOUNTER — Emergency Department
Admission: EM | Admit: 2017-10-27 | Discharge: 2017-10-27 | Disposition: A | Discharge status: Home / Self Care | Payer: Medicare Other | Attending: Emergency Medicine | Admitting: Emergency Medicine

## 2017-10-27 ENCOUNTER — Encounter: Payer: Self-pay | Admitting: Emergency Medicine

## 2017-10-27 DIAGNOSIS — R0602 Shortness of breath: Secondary | ICD-10-CM | POA: Diagnosis present

## 2017-10-27 DIAGNOSIS — F17210 Nicotine dependence, cigarettes, uncomplicated: Secondary | ICD-10-CM | POA: Insufficient documentation

## 2017-10-27 DIAGNOSIS — F419 Anxiety disorder, unspecified: Secondary | ICD-10-CM | POA: Insufficient documentation

## 2017-10-27 DIAGNOSIS — Z79899 Other long term (current) drug therapy: Secondary | ICD-10-CM | POA: Diagnosis not present

## 2017-10-27 MED ORDER — HYDROXYZINE HCL 50 MG PO TABS: 50.0000 mg | ORAL_TABLET | Freq: Once | ORAL | Status: DC

## 2017-10-27 MED ORDER — LORAZEPAM 1 MG PO TABS
1.0000 mg | ORAL_TABLET | Freq: Once | ORAL | Status: AC
  Administered 2017-10-27: 1 mg via ORAL
  Filled 2017-10-27: qty 1

## 2017-10-27 NOTE — ED Triage Notes (Signed)
Patient states had episode of shortness of breath lasting about 10 min 1 hour ago. States symptoms have subsided. Patient denies cough or fever.

## 2017-10-27 NOTE — ED Notes (Signed)
Pt attempted to call mother - no answer. States he has money for a cab and will take a cab. Pt to the lobby

## 2017-10-27 NOTE — ED Notes (Signed)
Report received - pt came from [facility]. Here for sob from anxiety and off his psych meds x 1 month. No si or hi, calm and cooperative

## 2017-10-27 NOTE — Discharge Instructions (Signed)
If you have any thoughts of hurting herself or anyone else return to the emergency department.  Follow closely with your primary care doctor and your psychiatrist.

## 2017-10-27 NOTE — ED Provider Notes (Addendum)
[facility] Emergency Department Provider Note  ____________________________________________   I have reviewed the triage vital signs and the nursing notes. Where available I have reviewed prior notes and, if possible and indicated, outside hospital notes.    HISTORY  Chief Complaint Shortness of Breath    HPI Edwin Montes is a 31 y.o. male  w of anxiety and schizophrenia states that he is here because he feels anxious, he is not having SI or HI, he states he thinks he may have had a panic attack earlier.  He is compliant with his medications to the extent that they are giving him.  He does get shots he states.  He follows up with  [facility]   He has no thoughts of hurting himself or anyone else.  He states that he has been feeling somewhat anxious. This is not currently hearing voices, and he has no command hallucinations   Past Medical History:  Diagnosis Date  . Anxiety   . Schizophrenia [facility])     Patient Active Problem List   Diagnosis Date Noted  . 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 Cannabis use disorder, moderate, dependence ([facility]) 11/03/2016  . Panic attack 06/23/2016  . Noncompliance 02/29/2016  . Tobacco use disorder 01/27/2016    Past Surgical History:  Procedure Laterality Date  . NO PAST SURGERIES      Prior to Admission medications   Medication Sig Start Date End Date Taking? Authorizing Provider  ARIPiprazole (ABILIFY) 20 MG tablet Take 1 tablet (20 mg total) by mouth daily. 07/02/17   Pucilowska, Braulio Conte B, MD  ARIPiprazole ER 400 MG SUSR Inject 400 mg into the muscle every 28 (twenty-eight) days. Due on September 18. 07/02/17   Pucilowska, Braulio Conte B, MD  benzonatate (TESSALON PERLES) 100 MG capsule Take 1 capsule (100 mg total) by mouth 3 (three) times daily as needed for cough. 08/06/17 08/06/18  Darci Current, MD  fluvoxaMINE (LUVOX) 100 MG tablet Take 1 tablet (100 mg total) by mouth at bedtime. 07/02/17    Pucilowska, Braulio Conte B, MD  haloperidol (HALDOL) 10 MG tablet Take 1 tablet (10 mg total) by mouth at bedtime. 07/02/17   Pucilowska, Braulio Conte B, MD  haloperidol decanoate (HALDOL DECANOATE) 100 MG/ML injection Inject 1 mL (100 mg total) into the muscle every 28 (twenty-eight) days. Next dose on September 18. 07/27/17   Pucilowska, Jolanta B, MD  traZODone (DESYREL) 100 MG tablet Take 1 tablet (100 mg total) by mouth at bedtime. 07/02/17   Shari Prows, MD    Allergies Patient has no known allergies.  No family history on file.  Social History Social History   Tobacco Use  . Smoking status: Current Some Day Smoker    Packs/day: 1.00    Years: 1.00    Pack years: 1.00    Types: Cigarettes  . Smokeless tobacco: Never Used  Substance Use Topics  . Alcohol use: No  . Drug use: Yes    Types: Other-see comments    Comment: Cannibis    Review of Systems Constitutional: No fever/chills Eyes: No visual changes. ENT: No sore throat. No stiff neck no neck pain Cardiovascular: Denies chest pain. Respiratory: Denies shortness of breath. Gastrointestinal:   no vomiting.  No diarrhea.  No constipation. Genitourinary: Negative for dysuria. Musculoskeletal: Negative lower extremity swelling Skin: Negative for rash. Neurological: Negative for severe headaches, focal weakness or numbness.   ____________________________________________   PHYSICAL EXAM:  VITAL SIGNS: ED Triage Vitals [10/27/17 1553]  Enc Vitals  Group     BP 138/77     Pulse Rate 76     Resp 20     Temp 98.7 F (37.1 C)     Temp Source Oral     SpO2 98 %     Weight 214 lb (97.1 kg)     Height 6\' 2"  (1.88 m)     Head Circumference      Peak Flow      Pain Score      Pain Loc      Pain Edu?      Excl. in GC?     Constitutional: Alert and oriented. Well appearing and in no acute distress. Eyes: Conjunctivae are normal Head: Atraumatic HEENT: No congestion/rhinnorhea. Mucous membranes are moist.   Oropharynx non-erythematous Neck:   Nontender with no meningismus, no masses, no stridor Cardiovascular: Normal rate, regular rhythm. Grossly normal heart sounds.  Good peripheral circulation. Respiratory: Normal respiratory effort.  No retractions. Lungs CTAB. Abdominal: Soft and nontender. No distention. No guarding no rebound Back:  There is no focal tenderness or step off.  there is no midline tenderness there are no lesions noted. there is no CVA tenderness Musculoskeletal: No lower extremity tenderness, no upper extremity tenderness. No joint effusions, no DVT signs strong distal pulses no edema Neurologic:  Normal speech and language. No gross focal neurologic deficits are appreciated.  Skin:  Skin is warm, dry and intact. No rash noted. Psychiatric: Mood and affect are somewhat bizarre. Speech and behavior are normal.  ____________________________________________   LABS (all labs ordered are listed, but only abnormal results are displayed)  Labs Reviewed - No data to display  Pertinent labs  results that were available during my care of the patient were reviewed by me and considered in my medical decision making (see chart for details). ____________________________________________  EKG  I personally interpreted any EKGs ordered by me or triage  ____________________________________________  RADIOLOGY  Pertinent labs & imaging results that were available during my care of the patient were reviewed by me and considered in my medical decision making (see chart for details). If possible, patient and/or family made aware of any abnormal findings.  No results found. ____________________________________________    PROCEDURES  Procedure(s) performed: None  Procedures  Critical Care performed: None  ____________________________________________   INITIAL IMPRESSION / ASSESSMENT AND PLAN / ED COURSE  Pertinent labs & imaging results that were available during my care of  the patient were reviewed by me and considered in my medical decision making (see chart for details).  Patient here with a history of anxiety and panic attack states he was anxious earlier today, he states he feels better at this time he has no SI no HI does have a history of schizophrenia, there is no evidence of significant decompensated schizophrenia he is    talking to me is not catatonic, he is interacting with me with poor eye contact but otherwise largely appropriately.  He would like sling for anxiety which we will give him.  I do not think he meets criteria for acute psychiatric inpatient admission at this time given absence of SI HI and no evidence of significantly decompensated schizophrenia currently noted or reported.  I have tried calling his mother and I did leave a HIPAA compliant message, but she has not called back.  ----------------------------------------- 10:11 PM on 10/27/2017 -----------------------------------------  Mother called back several hours after dc  She agrees w discharge does not feel he is a danger to self  and does feel comfortable with d/c.  She will follow-up with him closely and he has an act team and multiple other people in the community to look out for him.  She feels this was just anxiety, and she agrees with discharge completely.  She will send him back to the emergency room for any new or worrisome symptoms and she is very experienced with his symptoms. ____________________________________________   FINAL CLINICAL IMPRESSION(S) / ED DIAGNOSES  Final diagnoses:  None      This chart was dictated using voice recognition software.  Despite best efforts to proofread,  errors can occur which can change meaning.      Jeanmarie PlantMcShane, James A, MD 10/27/17 1744    Jeanmarie PlantMcShane, James A, MD 10/27/17 2215

## 2017-11-20 ENCOUNTER — Emergency Department
Admission: EM | Admit: 2017-11-20 | Discharge: 2017-11-20 | Disposition: A | Discharge status: Home / Self Care | Payer: Medicare Other | Attending: Emergency Medicine | Admitting: Emergency Medicine

## 2017-11-20 ENCOUNTER — Other Ambulatory Visit: Payer: Self-pay

## 2017-11-20 DIAGNOSIS — F203 Undifferentiated schizophrenia: Secondary | ICD-10-CM | POA: Diagnosis not present

## 2017-11-20 DIAGNOSIS — Z79899 Other long term (current) drug therapy: Secondary | ICD-10-CM | POA: Diagnosis not present

## 2017-11-20 DIAGNOSIS — F1220 Cannabis dependence, uncomplicated: Secondary | ICD-10-CM | POA: Diagnosis present

## 2017-11-20 DIAGNOSIS — R441 Visual hallucinations: Secondary | ICD-10-CM | POA: Diagnosis not present

## 2017-11-20 DIAGNOSIS — F419 Anxiety disorder, unspecified: Secondary | ICD-10-CM | POA: Diagnosis not present

## 2017-11-20 DIAGNOSIS — F410 Panic disorder [episodic paroxysmal anxiety] without agoraphobia: Secondary | ICD-10-CM | POA: Diagnosis present

## 2017-11-20 DIAGNOSIS — R0602 Shortness of breath: Secondary | ICD-10-CM | POA: Diagnosis present

## 2017-11-20 DIAGNOSIS — F209 Schizophrenia, unspecified: Secondary | ICD-10-CM

## 2017-11-20 DIAGNOSIS — F17210 Nicotine dependence, cigarettes, uncomplicated: Secondary | ICD-10-CM | POA: Insufficient documentation

## 2017-11-20 DIAGNOSIS — R443 Hallucinations, unspecified: Secondary | ICD-10-CM

## 2017-11-20 LAB — COMPREHENSIVE METABOLIC PANEL
ALK PHOS: 63 U/L (ref 38–126)
ALT: 21 U/L (ref 17–63)
ANION GAP: 9 (ref 5–15)
AST: 27 U/L (ref 15–41)
Albumin: 4.4 g/dL (ref 3.5–5.0)
BUN: 16 mg/dL (ref 6–20)
CALCIUM: 9.5 mg/dL (ref 8.9–10.3)
CO2: 25 mmol/L (ref 22–32)
Chloride: 105 mmol/L (ref 101–111)
Creatinine, Ser: 0.87 mg/dL (ref 0.61–1.24)
GFR calc non Af Amer: >60 mL/min (ref >60)
Glucose, Bld: 103 mg/dL — ABNORMAL HIGH (ref 65–99)
Potassium: 3.8 mmol/L (ref 3.5–5.1)
SODIUM: 139 mmol/L (ref 135–145)
TOTAL PROTEIN: 6.8 g/dL (ref 6.5–8.1)
Total Bilirubin: 1 mg/dL (ref 0.3–1.2)

## 2017-11-20 LAB — CBC
HCT: 44.1 % (ref 40.0–52.0)
Hemoglobin: 15.3 g/dL (ref 13.0–18.0)
MCH: 30.6 pg (ref 26.0–34.0)
MCHC: 34.6 g/dL (ref 32.0–36.0)
MCV: 88.5 fL (ref 80.0–100.0)
Platelets: 249 10*3/uL (ref 150–440)
RBC: 4.99 MIL/uL (ref 4.40–5.90)
RDW: 12.7 % (ref 11.5–14.5)
WBC: 7.1 10*3/uL (ref 3.8–10.6)

## 2017-11-20 LAB — SALICYLATE LEVEL

## 2017-11-20 LAB — ETHANOL: Alcohol, Ethyl (B): <10 mg/dL (ref <10)

## 2017-11-20 LAB — ACETAMINOPHEN LEVEL

## 2017-11-20 NOTE — BH Assessment (Signed)
Assessment Note  Edwin Montes is an 32 y.o. male who present to the ED following V/H that caused severe anxiety. Pt  States that he was seeing shadows floating by him and became anxious. Pt has been calm and quiet in the ED sleeping the majority of the time. Pt states that he lives alone in his own apartment. The pt's response was delayed about 3 to 4 seconds after being asked a questions and his eyes blinked rapidly.   Pt listed his mother Edwin Montes(Teresa Gasbarro) as his family support person. TTS called the mother to confirm the pt's living situation and she confirmed that he does live alone. She reports that he does have a peer support person who who takes him to his appointments and helps him navigate the community. Pt's mother and peer support person has tried in the past to encourage him against calling the EMS when he is feeling anxious, suggesting that he try learned coping techniques first.  Pt denies SI/HI A/H   Diagnosis: Schizophrenia, Anxiety   Past Medical History:  Past Medical History:  Diagnosis Date  . Anxiety   . Schizophrenia [facility](HCC)     Past Surgical History:  Procedure Laterality Date  . NO PAST SURGERIES      Family History: No family history on file.  Social History:  reports that he has been smoking cigarettes.  He has a 1.00 pack-year smoking history. he has never used smokeless tobacco. He reports that he uses drugs. Drug: Other-see comments. He reports that he does not drink alcohol.  Additional Social History:  Alcohol / Drug Use Pain Medications: See MAR Prescriptions: See MAR Over the Counter: See MAR History of alcohol / drug use?: No history of alcohol / drug abuse  CIWA: CIWA-Ar BP: 119/64 Pulse Rate: 81 COWS:    Allergies: No Known Allergies  Home Medications:  (Not in a hospital admission)  OB/GYN Status:  No LMP for male patient.  General Assessment Data [facility] Assessment: In system Is this a Tele or  Face-to-Face Assessment?: Face-to-Face Is this an Initial Assessment or a Re-assessment for this encounter?: Initial Assessment Marital status: Single Maiden name: N/A Is patient pregnant?: No Pregnancy Status: No Living Arrangements: Alone Can pt return to current living arrangement?: Yes Admission Status: Voluntary Is patient capable of signing voluntary admission?: Yes Referral Source: Self/Family/Friend Insurance type: Medicare  Medical Screening Exam [facility] Walk-in ONLY) Medical Exam completed: Yes  Crisis Care Plan Living Arrangements: Alone Legal Guardian: Other:(Self) Name of Psychiatrist: Dr. Georjean ModeLitz Name of Therapist: [facility]  Education Status Is patient currently in school?: No Current Grade: n/a Highest grade of school patient has completed: [employer] Name of school: n/a Contact person: n/a  Risk to self with the past 6 months Suicidal Ideation: No Has patient been a risk to self within the past 6 months prior to admission? : No Suicidal Intent: No Has patient had any suicidal intent within the past 6 months prior to admission? : No Is patient at risk for suicide?: No Suicidal Plan?: No Has patient had any suicidal plan within the past 6 months prior to admission? : No Access to Means: No What has been your use of drugs/alcohol within the last 12 months?: None reported Previous Attempts/Gestures: No How many times?: 0 Other Self Harm Risks: n/a Triggers for Past Attempts: None known Intentional Self Injurious Behavior: None Family Suicide History: Unknown Recent stressful life event(s): (V/H) Persecutory voices/beliefs?: No Depression: No Depression Symptoms: (n/a)  Substance abuse history and/or treatment for substance abuse?: No Suicide prevention information given to non-admitted patients: Not applicable  Risk to Others within the past 6 months Homicidal Ideation: No Does patient have any lifetime risk of violence toward others beyond the six months prior  to admission? : No Thoughts of Harm to Others: No Current Homicidal Intent: No Current Homicidal Plan: No Access to Homicidal Means: No Identified Victim: none History of harm to others?: No Assessment of Violence: None Noted Violent Behavior Description: none Does patient have access to weapons?: No Criminal Charges Pending?: No Does patient have a court date: No Is patient on probation?: No  Psychosis Hallucinations: Visual Delusions: None noted  Mental Status Report Appearance/Hygiene: Unremarkable, In scrubs Eye Contact: Fair Motor Activity: Unremarkable Speech: Logical/coherent, Soft, Slow Level of Consciousness: Alert Mood: Sad, Pleasant Affect: Flat Anxiety Level: Severe Thought Processes: Coherent, Relevant, Circumstantial Judgement: Partial Orientation: Person, Place, Time, Situation, Appropriate for developmental age Obsessive Compulsive Thoughts/Behaviors: Minimal  Cognitive Functioning Concentration: Normal Memory: Recent Intact, Remote Intact IQ: Average Insight: Fair Impulse Control: Fair Appetite: Good Weight Loss: 0 Weight Gain: 0 Sleep: No Change Total Hours of Sleep: 8 Vegetative Symptoms: None     Prior Inpatient Therapy Prior Inpatient Therapy: Yes Prior Therapy Dates: Multiple Hospitalizations Prior Therapy Facilty/Provider(s): [facility] Reason for Treatment: Schizophrenia   Prior Outpatient Therapy Prior Outpatient Therapy: Yes Prior Therapy Dates: Current Prior Therapy Facilty/Provider(s): [facility] Reason for Treatment: Schizophrenia  Does patient have an ACCT team?: No Does patient have Intensive In-House Services?  : No Does patient have [facility] services? : No Does patient have P4CC services?: No  ADL Screening (condition at time of admission) Is the patient deaf or have difficulty hearing?: No Does the patient have difficulty seeing, even when wearing glasses/contacts?: No Does the patient have difficulty concentrating,  remembering, or making decisions?: No Does the patient have difficulty dressing or bathing?: No Does the patient have difficulty walking or climbing stairs?: No Weakness of Legs: None Weakness of Arms/Hands: None  Home Assistive Devices/Equipment Home Assistive Devices/Equipment: None  Therapy Consults (therapy consults require a physician order) PT Evaluation Needed: No OT Evalulation Needed: No SLP Evaluation Needed: No Abuse/Neglect Assessment (Assessment to be complete while patient is alone) Abuse/Neglect Assessment Can Be Completed: Yes Physical Abuse: Denies Verbal Abuse: Denies Sexual Abuse: Denies Exploitation of patient/patient's resources: Denies Self-Neglect: Denies Values / Beliefs Cultural Requests During Hospitalization: None Consults Spiritual Care Consult Needed: No Social Work Consult Needed: No Merchant navy officer (For Healthcare) Does Patient Have a Medical Advance Directive?: No    Additional Information 1:1 In Past 12 Months?: Yes CIRT Risk: No Elopement Risk: No Does patient have medical clearance?: Yes  Child/Adolescent Assessment Running Away Risk: Denies Bed-Wetting: Denies Destruction of Property: Denies Cruelty to Animals: Denies Stealing: Denies Rebellious/Defies Authority: Denies Satanic Involvement: Denies Archivist: Denies Problems at [employer]: Denies Gang Involvement: Denies  Disposition:  Disposition Initial Assessment Completed for this Encounter: Yes Disposition of Patient: Discharge with Outpatient Resources  On Site Evaluation by:   Reviewed with Physician:    Sherryl Valido D Lucca Greggs 11/20/2017 11:34 PM

## 2017-11-20 NOTE — ED Notes (Signed)
Edwin Montes returned call, states he can pick pt up

## 2017-11-20 NOTE — ED Provider Notes (Addendum)
[facility] Emergency Department Provider Note   ____________________________________________   First MD Initiated Contact with Patient 11/20/17 1937     (approximate)  I have reviewed the triage vital signs and the nursing notes.   HISTORY  Chief Complaint Shortness of Breath and Hallucinations    HPI Edwin Montes is a 32 y.o. male Patient reportedly had an episode of shortness of breath and seeing shadows. Shortness of breath is gone in the shadows are much better he reports. I asked him if he was smoking anything and he said no he does smell smoky. He reports this is happened before but never went to see anybody. He has no known medical problems. shortness of breath probably did not last more than half an hour he thinks.  Past Medical History:  Diagnosis Date  . Anxiety   . Schizophrenia [facility](HCC)     Patient Active Problem List   Diagnosis Date Noted  . 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 Cannabis use disorder, moderate, dependence (HCC) 11/03/2016  . Panic attack 06/23/2016  . Noncompliance 02/29/2016  . Tobacco use disorder 01/27/2016    Past Surgical History:  Procedure Laterality Date  . NO PAST SURGERIES      Prior to Admission medications   Medication Sig Start Date End Date Taking? Authorizing Provider  ARIPiprazole (ABILIFY) 20 MG tablet Take 1 tablet (20 mg total) by mouth daily. 07/02/17   Pucilowska, Braulio ConteJolanta B, MD  ARIPiprazole ER 400 MG SUSR Inject 400 mg into the muscle every 28 (twenty-eight) days. Due on September 18. 07/02/17   Pucilowska, Braulio ConteJolanta B, MD  benzonatate (TESSALON PERLES) 100 MG capsule Take 1 capsule (100 mg total) by mouth 3 (three) times daily as needed for cough. 08/06/17 08/06/18  Darci CurrentBrown, Canadian N, MD  fluvoxaMINE (LUVOX) 100 MG tablet Take 1 tablet (100 mg total) by mouth at bedtime. 07/02/17   Pucilowska, Braulio ConteJolanta B, MD  haloperidol (HALDOL) 10 MG tablet Take 1 tablet (10 mg total) by mouth at  bedtime. 07/02/17   Pucilowska, Braulio ConteJolanta B, MD  haloperidol decanoate (HALDOL DECANOATE) 100 MG/ML injection Inject 1 mL (100 mg total) into the muscle every 28 (twenty-eight) days. Next dose on September 18. 07/27/17   Pucilowska, Jolanta B, MD  traZODone (DESYREL) 100 MG tablet Take 1 tablet (100 mg total) by mouth at bedtime. 07/02/17   Shari ProwsPucilowska, Jolanta B, MD    Allergies Patient has no known allergies.  No family history on file.  Social History Social History   Tobacco Use  . Smoking status: Current Some Day Smoker    Packs/day: 1.00    Years: 1.00    Pack years: 1.00    Types: Cigarettes  . Smokeless tobacco: Never Used  Substance Use Topics  . Alcohol use: No  . Drug use: Yes    Types: Other-see comments    Comment: Cannibis    Review of Systems  Constitutional: No fever/chills Eyes: No visual changes. ENT: No sore throat. Cardiovascular: Denies chest pain. Respiratory: Denies shortness of breath at present. Gastrointestinal: No abdominal pain.  No nausea, no vomiting.  No diarrhea.  No constipation. Genitourinary: Negative for dysuria. Musculoskeletal: Negative for back pain. Skin: Negative for rash. Neurological: Negative for headaches, focal weakness   ____________________________________________   PHYSICAL EXAM:  VITAL SIGNS: ED Triage Vitals [11/20/17 1823]  Enc Vitals Group     BP 130/68     Pulse Rate 84     Resp 16     Temp  98.7 F (37.1 C)     Temp Source Oral     SpO2 96 %     Weight 214 lb (97.1 kg)     Height 6\' 2"  (1.88 m)     Head Circumference      Peak Flow      Pain Score      Pain Loc      Pain Edu?      Excl. in GC?     Constitutional: Alert and oriented. Well appearing and in no acute distress. Eyes: Conjunctivae are normal. PERRL. EOMI. Head: Atraumatic. Nose: No congestion/rhinnorhea. Mouth/Throat: Mucous membranes are moist.  Oropharynx non-erythematous. Neck: No stridor Cardiovascular: Normal rate, regular rhythm.  Grossly normal heart sounds.  Good peripheral circulation. Respiratory: Normal respiratory effort.  No retractions. Lungs CTAB. Gastrointestinal: Soft and nontender. No distention. No abdominal bruits. No CVA tenderness. Musculoskeletal: No lower extremity tenderness nor edema.  No joint effusions. Neurologic:  Normal speech and language. No gross focal neurologic deficits are appreciated.  Skin:  Skin is warm, dry and intact. No rash noted.   ____________________________________________   LABS (all labs ordered are listed, but only abnormal results are displayed)  Labs Reviewed  COMPREHENSIVE METABOLIC PANEL - Abnormal; Notable for the following components:      Result Value   Glucose, Bld 103 (*)    All other components within normal limits  ACETAMINOPHEN LEVEL - Abnormal; Notable for the following components:   Acetaminophen (Tylenol), Serum <10 (*)    All other components within normal limits  ETHANOL  SALICYLATE LEVEL  CBC  URINE DRUG SCREEN, QUALITATIVE ([facility] ONLY)   ____________________________________________  EKG   ____________________________________________  RADIOLOGY   ____________________________________________   PROCEDURES  Procedure(s) performed:   Procedures  Critical Care performed:   ____________________________________________   INITIAL IMPRESSION / ASSESSMENT AND PLAN / ED COURSE  patient is better now. He has a history of schizophrenia and cannabis use disorder. He smells faintly like he's been smoking cannabis. I wonder if this may have exacerbated his baseline schizophrenia.  patient seen by Dr. Mat Carne packs who knows him. Dr. Mat Carne packs feels he is stable and okay to go home this is the best he's been for a while. I will let him do this.     ____________________________________________   FINAL CLINICAL IMPRESSION(S) / ED DIAGNOSES  Final diagnoses:  Hallucinations  Baseline schizophrenia   ED Discharge Orders    None        Note:  This document was prepared using Dragon voice recognition software and may include unintentional dictation errors.    Arnaldo Natal, MD 11/20/17 2040    Arnaldo Natal, MD 11/20/17 2131

## 2017-11-20 NOTE — ED Notes (Addendum)
Awaiting to verify that pt does not have legal guardian or lives at group home per Dr's note. Charge RN aware.

## 2017-11-20 NOTE — ED Notes (Addendum)
Edwin Montes [phone] Pt's peer supprt person per Rosey Batheresa, pt's mother

## 2017-11-20 NOTE — ED Notes (Signed)

## 2017-11-20 NOTE — ED Triage Notes (Signed)
Pt to ER c/o SOB that occurred earlier in day, no SOB at this time. Pt reports that he has been seeing shadows today, reports this is ongoing X 8 months. Denies auditory hallucinations.

## 2017-11-20 NOTE — Consult Note (Signed)
[facility] Consult   Reason for Consult: Consult for 32 year old man with history of schizophrenia came voluntarily to the ER Referring Physician: Rip Harbour Patient Identification: Edwin Montes MRN:  [id number] Principal Diagnosis: Undifferentiated schizophrenia [facility]) Diagnosis:   Patient Active Problem List   Diagnosis Date Noted  . Undifferentiated schizophrenia ([place]) [F20.3] 03/24/2017    Priority: High  . Cannabis use disorder, moderate, dependence ([place]) [F12.20] 11/03/2016  . Panic attack [F41.0] 06/23/2016  . Noncompliance [Z91.19] 02/29/2016  . Tobacco use disorder [F17.200] 01/27/2016    Total Time spent with patient: 1 hour  Subjective:   Edwin Montes is a 32 y.o. male patient admitted with "I got nervous".  HPI: Patient interviewed chart reviewed.  Patient known from previous encounters.  32 year old man with schizophrenia came voluntarily to the emergency room.  He is a somewhat vague historian but said that he got nervous tonight.  He was "seeing shadows".  He asks to have them bring him over here.  He denies having any suicidal thoughts.  Denies homicidal thoughts.  He says that anything that he was seen before seems to have calm down now.  He is not having auditory hallucinations.  No evidence that he was violent or agitated.  Denies that he got aggressive with anyone or broke anything at home.  He says he is still compliant with his medication.  He is vague about whether he might of used some marijuana recently.  Social history: Patient lives in a group home.  Has a guardian.  Medical history: No significant medical problems  Substance abuse history: Unfortunate tendency to consume cannabis which seems to always make him freak out and get anxious and panicky.  Past Psychiatric History: Patient has a history of schizophrenia and has had several prior hospitalizations including hospitalizations here.  Previous hospitalization here was mostly  marked by him staying isolated in his bed.  Does not tend to get aggressive or violent or agitated or tried to hurt himself.  Risk to Self: Is patient at risk for suicide?: No Risk to Others:   Prior Inpatient Therapy:   Prior Outpatient Therapy:    Past Medical History:  Past Medical History:  Diagnosis Date  . Anxiety   . Schizophrenia [facility])     Past Surgical History:  Procedure Laterality Date  . NO PAST SURGERIES     Family History: No family history on file. Family Psychiatric  History: None known Social History:  Social History   Substance and Sexual Activity  Alcohol Use No     Social History   Substance and Sexual Activity  Drug Use Yes  . Types: Other-see comments   Comment: Cannibis    Social History   Socioeconomic History  . Marital status: Single    Spouse name: None  . Number of children: None  . Years of education: None  . Highest education level: None  Social Needs  . Financial resource strain: None  . Food insecurity - worry: None  . Food insecurity - inability: None  . Transportation needs - medical: None  . Transportation needs - non-medical: None  Occupational History  . None  Tobacco Use  . Smoking status: Current Some Day Smoker    Packs/day: 1.00    Years: 1.00    Pack years: 1.00    Types: Cigarettes  . Smokeless tobacco: Never Used  Substance and Sexual Activity  . Alcohol use: No  . Drug use: Yes    Types: Other-see comments  Comment: Cannibis  . Sexual activity: Not Currently  Other Topics Concern  . None  Social History Narrative  . None   Additional Social History:    Allergies:  No Known Allergies  Labs:  Results for orders placed or performed during the hospital encounter of 11/20/17 (from the past 48 hour(s))  Comprehensive metabolic panel     Status: Abnormal   Collection Time: 11/20/17  6:21 PM  Result Value Ref Range   Sodium 139 135 - 145 mmol/L   Potassium 3.8 3.5 - 5.1 mmol/L   Chloride 105 101 - 111  mmol/L   CO2 25 22 - 32 mmol/L   Glucose, Bld 103 (H) 65 - 99 mg/dL   BUN 16 6 - 20 mg/dL   Creatinine, Ser 0.87 0.61 - 1.24 mg/dL   Calcium 9.5 8.9 - 10.3 mg/dL   Total Protein 6.8 6.5 - 8.1 g/dL   Albumin 4.4 3.5 - 5.0 g/dL   AST 27 15 - 41 U/L   ALT 21 17 - 63 U/L   Alkaline Phosphatase 63 38 - 126 U/L   Total Bilirubin 1.0 0.3 - 1.2 mg/dL   GFR calc non Af Amer >60 >60 mL/min   GFR calc Af Amer >60 >60 mL/min    Comment: (NOTE) The eGFR has been calculated using the CKD EPI equation. This calculation has not been validated in all clinical situations. eGFR's persistently <60 mL/min signify possible Chronic Kidney Disease.    Anion gap 9 5 - 15    Comment: Performed at [facility], [address] [place], [place] [address]  Ethanol     Status: None   Collection Time: 11/20/17  6:21 PM  Result Value Ref Range   Alcohol, Ethyl (B) <10 <10 mg/dL    Comment:        LOWEST DETECTABLE LIMIT FOR SERUM ALCOHOL IS 10 mg/dL FOR MEDICAL PURPOSES ONLY Performed at [facility], [address] [place], [place] [address]   Salicylate level     Status: None   Collection Time: 11/20/17  6:21 PM  Result Value Ref Range   Salicylate Lvl <8.7 2.8 - 30.0 mg/dL    Comment: Performed at [facility], [address] [place], [place] [address]  Acetaminophen level     Status: Abnormal   Collection Time: 11/20/17  6:21 PM  Result Value Ref Range   Acetaminophen (Tylenol), Serum <10 (L) 10 - 30 ug/mL    Comment:        THERAPEUTIC CONCENTRATIONS VARY SIGNIFICANTLY. A RANGE OF 10-30 ug/mL MAY BE AN EFFECTIVE CONCENTRATION FOR MANY PATIENTS. HOWEVER, SOME ARE BEST TREATED AT CONCENTRATIONS OUTSIDE THIS RANGE. ACETAMINOPHEN CONCENTRATIONS >150 ug/mL AT 4 HOURS AFTER INGESTION AND >50 ug/mL AT 12 HOURS AFTER INGESTION ARE OFTEN ASSOCIATED WITH TOXIC REACTIONS. Performed at [facility], [address] [place], [place] [address]   cbc      Status: None   Collection Time: 11/20/17  6:21 PM  Result Value Ref Range   WBC 7.1 3.8 - 10.6 K/uL   RBC 4.99 4.40 - 5.90 MIL/uL   Hemoglobin 15.3 13.0 - 18.0 g/dL   HCT 44.1 40.0 - 52.0 %   MCV 88.5 80.0 - 100.0 fL   MCH 30.6 26.0 - 34.0 pg   MCHC 34.6 32.0 - 36.0 g/dL   RDW 12.7 11.5 - 14.5 %   Platelets 249 150 - 440 K/uL    Comment: Performed at [facility], [address] [place], [place] [address]    No current facility-administered medications for this encounter.    Current Outpatient Medications  Medication Sig Dispense Refill  . ARIPiprazole (ABILIFY) 20 MG tablet Take 1 tablet (20 mg total) by mouth daily. 30 tablet 1  . ARIPiprazole ER 400 MG SUSR Inject 400 mg into the muscle every 28 (twenty-eight) days. Due on September 18. 1 each 1  . benzonatate (TESSALON PERLES) 100 MG capsule Take 1 capsule (100 mg total) by mouth 3 (three) times daily as needed for cough. 30 capsule 0  . fluvoxaMINE (LUVOX) 100 MG tablet Take 1 tablet (100 mg total) by mouth at bedtime. 30 tablet 1  . haloperidol (HALDOL) 10 MG tablet Take 1 tablet (10 mg total) by mouth at bedtime. 30 tablet 1  . haloperidol decanoate (HALDOL DECANOATE) 100 MG/ML injection Inject 1 mL (100 mg total) into the muscle every 28 (twenty-eight) days. Next dose on September 18. 1 mL 1  . traZODone (DESYREL) 100 MG tablet Take 1 tablet (100 mg total) by mouth at bedtime. 30 tablet 1    Musculoskeletal: Strength & Muscle Tone: within normal limits Gait & Station: normal Patient leans: N/A  Psychiatric Specialty Exam: Physical Exam  Constitutional: He appears well-developed and well-nourished.  HENT:  Head: Normocephalic and atraumatic.  Eyes: Conjunctivae are normal. Pupils are equal, round, and reactive to light.  Neck: Normal range of motion.  Cardiovascular: Normal heart sounds.  Respiratory: Effort normal.  GI: Soft.  Musculoskeletal: Normal range of motion.  Neurological: He is alert.  Skin:  Skin is warm and dry.  Psychiatric: His affect is blunt. His speech is delayed. He is slowed. Thought content is not paranoid. Cognition and memory are impaired. He expresses impulsivity. He expresses no homicidal and no suicidal ideation.    Review of Systems  Constitutional: Negative.   HENT: Negative.   Eyes: Negative.   Respiratory: Negative.   Cardiovascular: Negative.   Gastrointestinal: Negative.   Musculoskeletal: Negative.   Skin: Negative.   Neurological: Negative.   Psychiatric/Behavioral: Positive for hallucinations. Negative for depression, memory loss, substance abuse and suicidal ideas. The patient is nervous/anxious. The patient does not have insomnia.     Blood pressure 130/68, pulse 84, temperature 98.7 F (37.1 C), temperature source Oral, resp. rate 16, height _0  (1.88 m), weight 97.1 kg (214 lb), SpO2 96 %.Body mass index is 27.48 kg/m.  General Appearance: Fairly Groomed  Eye Contact:  Minimal  Speech:  Slow  Volume:  Decreased  Mood:  Euthymic  Affect:  Constricted  Thought Process:  Goal Directed  Orientation:  Full (Time, Place, and Person)  Thought Content:  Logical  Suicidal Thoughts:  No  Homicidal Thoughts:  No  Memory:  Immediate;   Fair Recent;   Fair Remote;   Fair  Judgement:  Impaired  Insight:  Shallow  Psychomotor Activity:  Decreased  Concentration:  Concentration: Fair  Recall:  [employer] of Knowledge:  Fair  Language:  Fair  Akathisia:  No  Handed:  Right  AIMS (if indicated):     Assets:  Desire for Improvement Financial Resources/Insurance Housing Physical Health Resilience Social Support  ADL's:  Intact  Cognition:  Impaired,  Mild  Sleep:        Treatment Plan Summary: Plan Patient with schizophrenia comes to the emergency room with vague complaints of anxiety.  He is not acting out and not aggressive.  Denies suicidal thoughts.  Has been compliant with treatment.  Based on previous encounters this is probably back  to his baseline.  Patient does not meet commitment criteria and does not require inpatient hospitalization.  Supportive counseling and review of the importance of not using marijuana and of staying on his medicine.  Patient agrees.  Case reviewed with ER physician.  The patient can be discharged back to his home.  Disposition: No evidence of imminent risk to self or others at present.   Patient does not meet criteria for psychiatric inpatient admission. Supportive therapy provided about ongoing stressors.  Alethia Berthold, MD 11/20/2017 9:26 PM

## 2017-11-20 NOTE — ED Notes (Signed)
Chales Abrahamsyson called x2, message left for possible ride home for pt, will follow up.

## 2017-11-20 NOTE — BH Assessment (Signed)
This Clinical research associatewriter called and talked to pt's mother [name](Teresa Warwick [phone]) to confirm pt's living situation. Mother states that pt does in fact live alone and transitioned out of his group home over 1 year ago. She says that he has a peer support person named Chales Abrahamsyson who is available to pick up the pt upon d/c.

## 2017-11-20 NOTE — ED Notes (Signed)
Pt denies SI/HI at this time with this RN.

## 2017-11-20 NOTE — ED Notes (Signed)
Pt has keys and wallet with him, states no money is in it. Black nike shoes, pants, shirt and socks.

## 2017-11-20 NOTE — ED Notes (Signed)
Report off to kasey rn  

## 2017-11-20 NOTE — Discharge Instructions (Signed)
please return home. Follow up with your counselor, you can see [facility] as well. Return as needed especially if  you get any worse.

## 2017-11-20 NOTE — ED Notes (Signed)
This RN and [employer] officer in room while patient changed into hospital scrubs. Belongings secured.

## 2017-11-20 NOTE — ED Notes (Signed)
Patient states he lives alone in apartment and does not have a legal guardian but per Dr. Toni Amendlapacs consult pt lives in group home and does have a guardian. Spoke with Dr. Toni Amendlapacs to verify information and he stated that he was not sure about the legal guardian status and that could have been noted by mistake. He also states he is unsure of patient living situation, he based it on last admission. Dr. Toni Amendlapacs states if patient does not have a legal guardian and lives along he can be dischaged to his own residence by taxi. Will consult [facility] to see if they have any information about living situation or legal guardianship before discharging patient.

## 2017-11-20 NOTE — ED Notes (Addendum)
Per [facility], pt does live alone in apartment and has peer support person named Chales Abrahamsyson, [facility] number from [facility]

## 2017-11-21 NOTE — ED Notes (Signed)
Pt changed into his clothes, states he has all of his belongings in his pockets

## 2017-11-21 NOTE — ED Notes (Signed)
Edwin Montes here to pick pt up

## 2019-02-10 IMAGING — CR DG CHEST 2V
1 series · 2 of 2 positions shown · non-contrast
Comparison: None.

CLINICAL DATA: Shortness of breath and anxiety.

EXAM:
CHEST  2 VIEW

[Series 1: dg chest 2 view · 0.14mm/px · 2 of 2 slices shown]
[im 1/2]
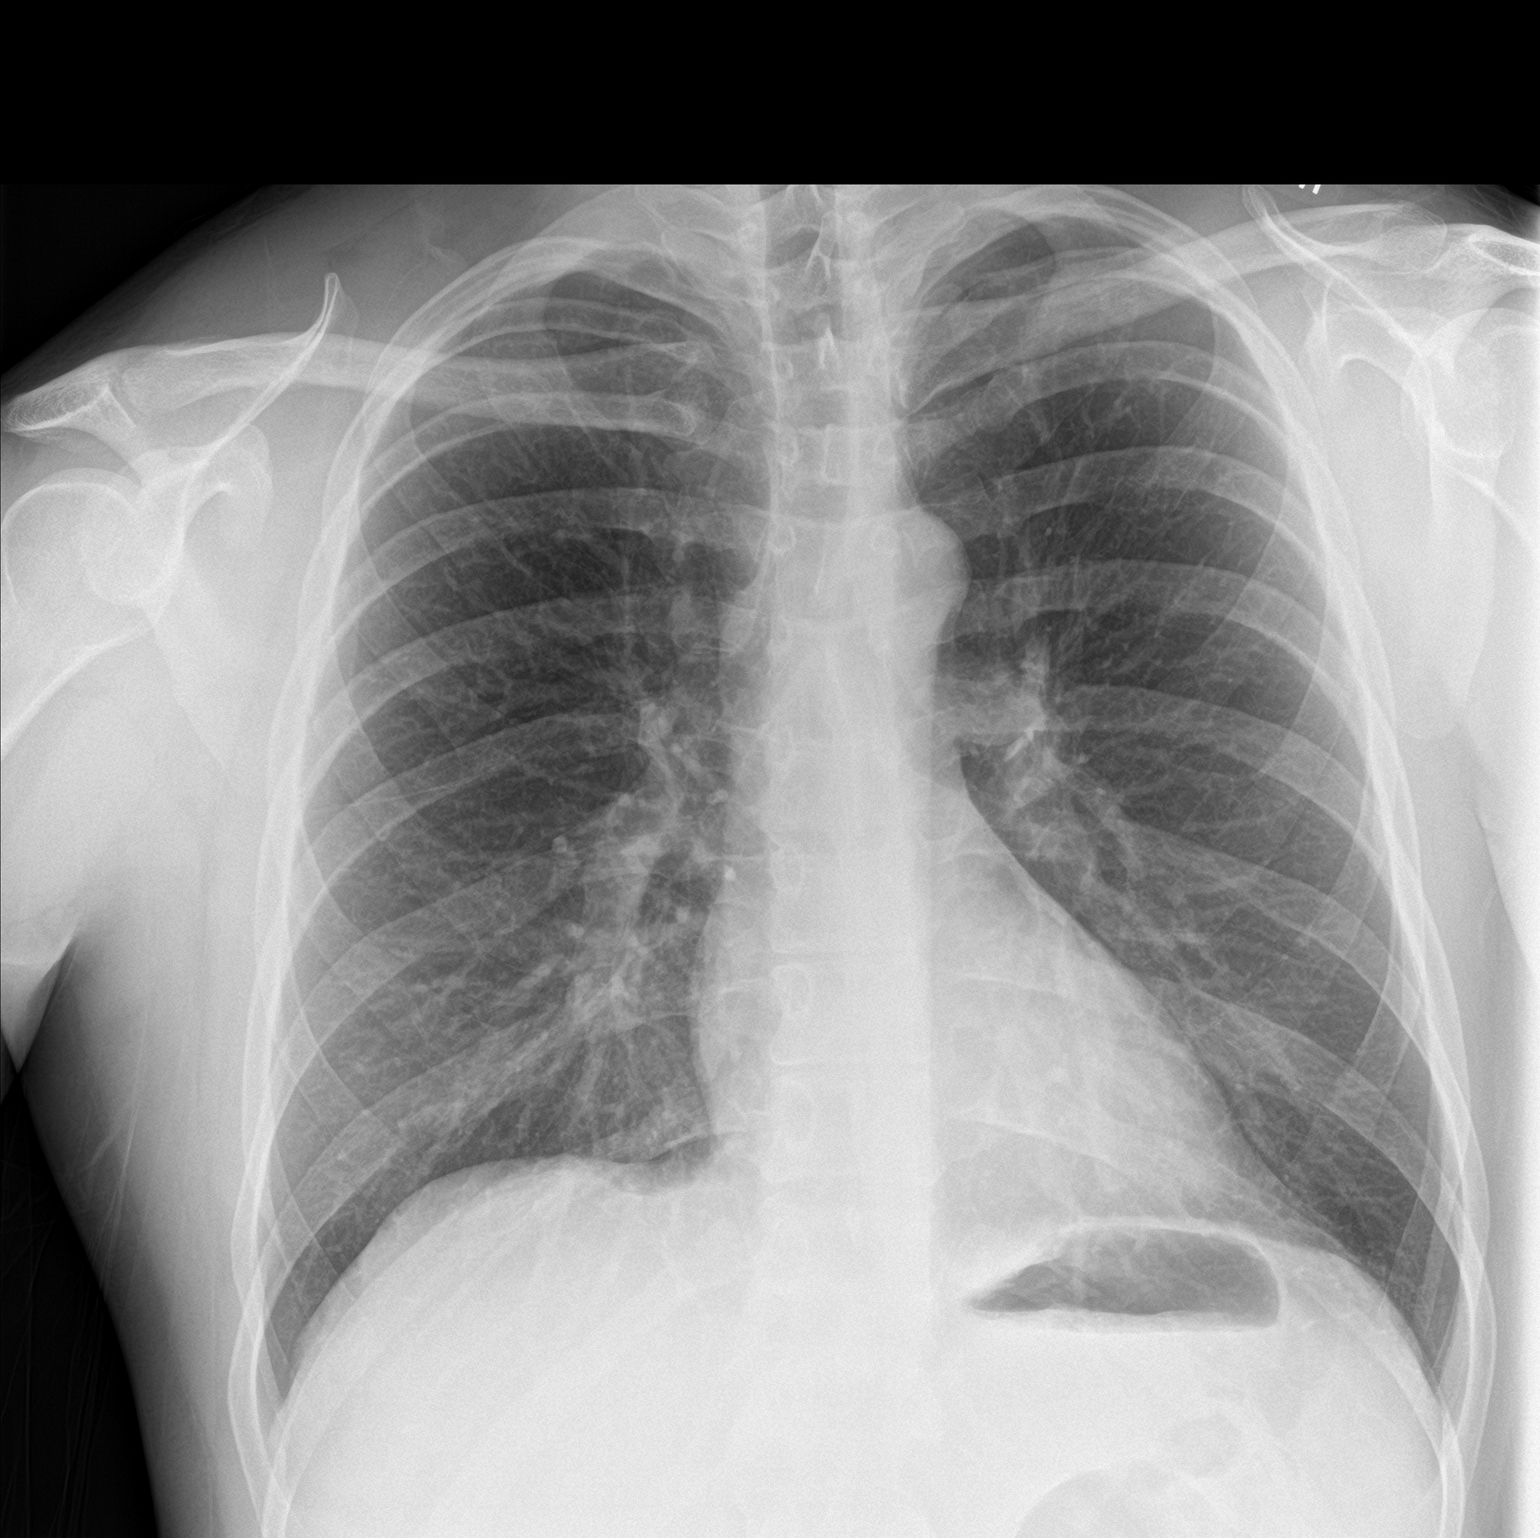
[im 2/2]
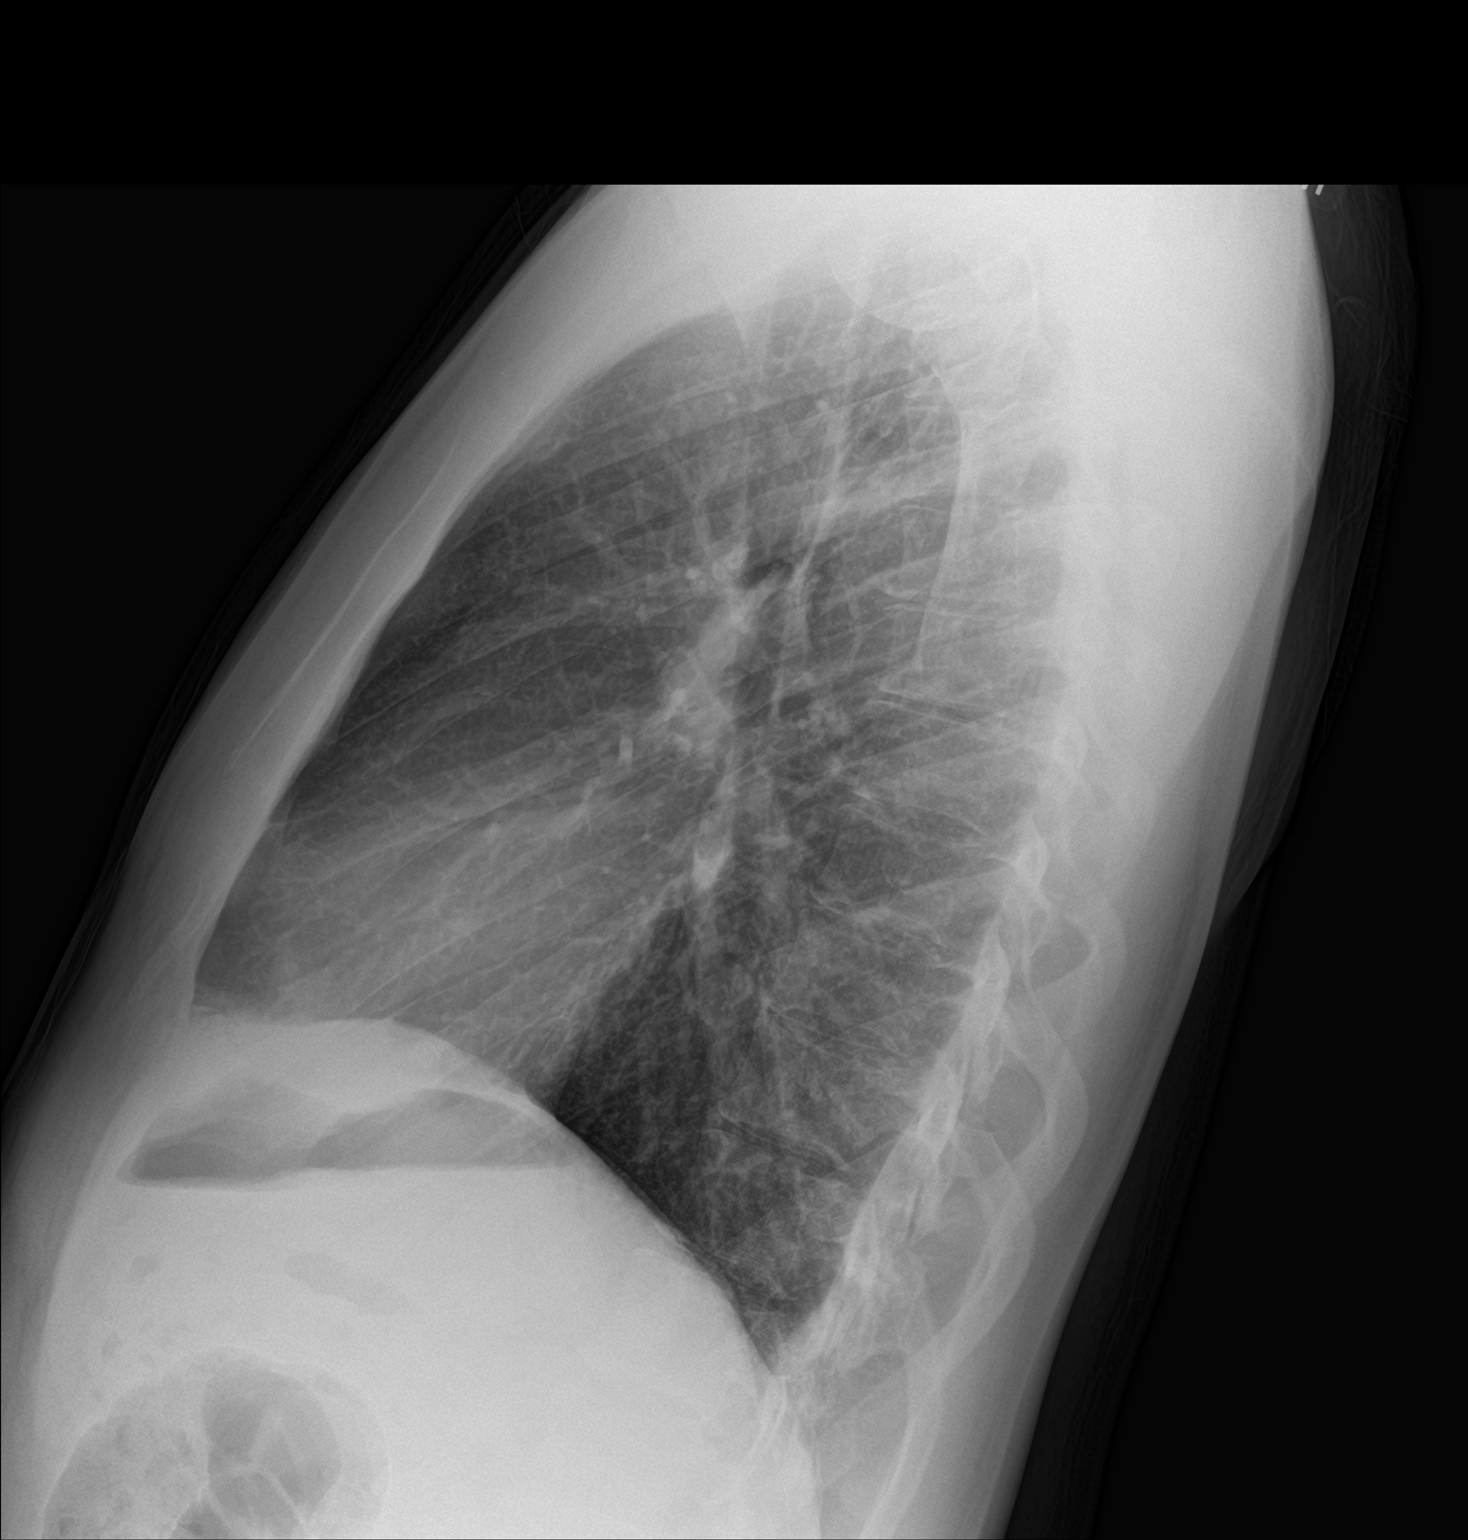

[2 of 2 positions shown; findings below may reference images not displayed]

FINDINGS: Cardiomediastinal silhouette is normal. No pleural effusions or
focal consolidations. Trachea projects midline and there is no
pneumothorax. Soft tissue planes and included osseous structures are
non-suspicious.
IMPRESSION: Normal chest.

## 2019-07-17 IMAGING — CR DG CHEST 2V
1 series · 2 of 2 positions shown · non-contrast
Comparison: February 22, 2017

CLINICAL DATA: Shortness of breath.

EXAM:
CHEST  2 VIEW

[Series 1: dg chest 2 view · 0.14mm/px · 2 of 2 slices shown]
[im 1/2]
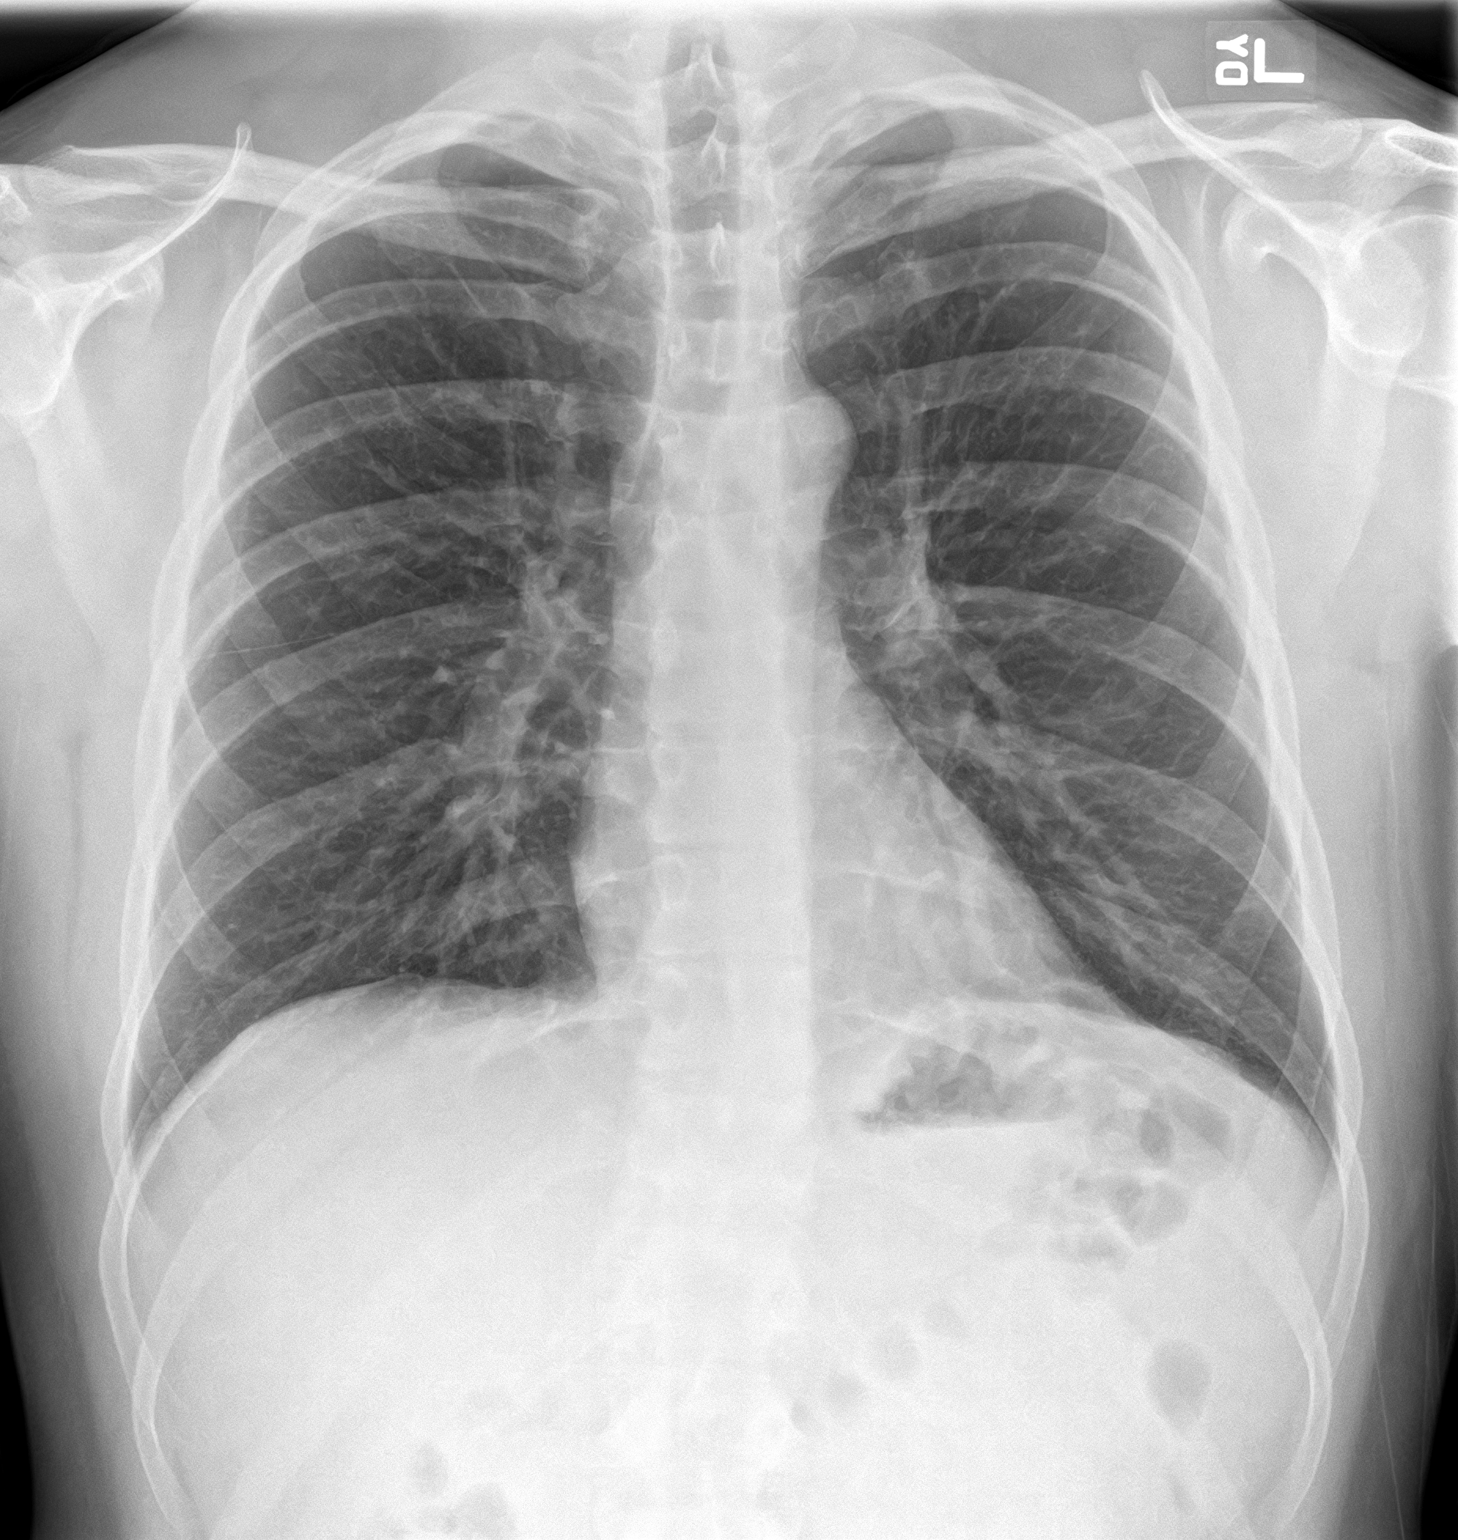
[im 2/2]
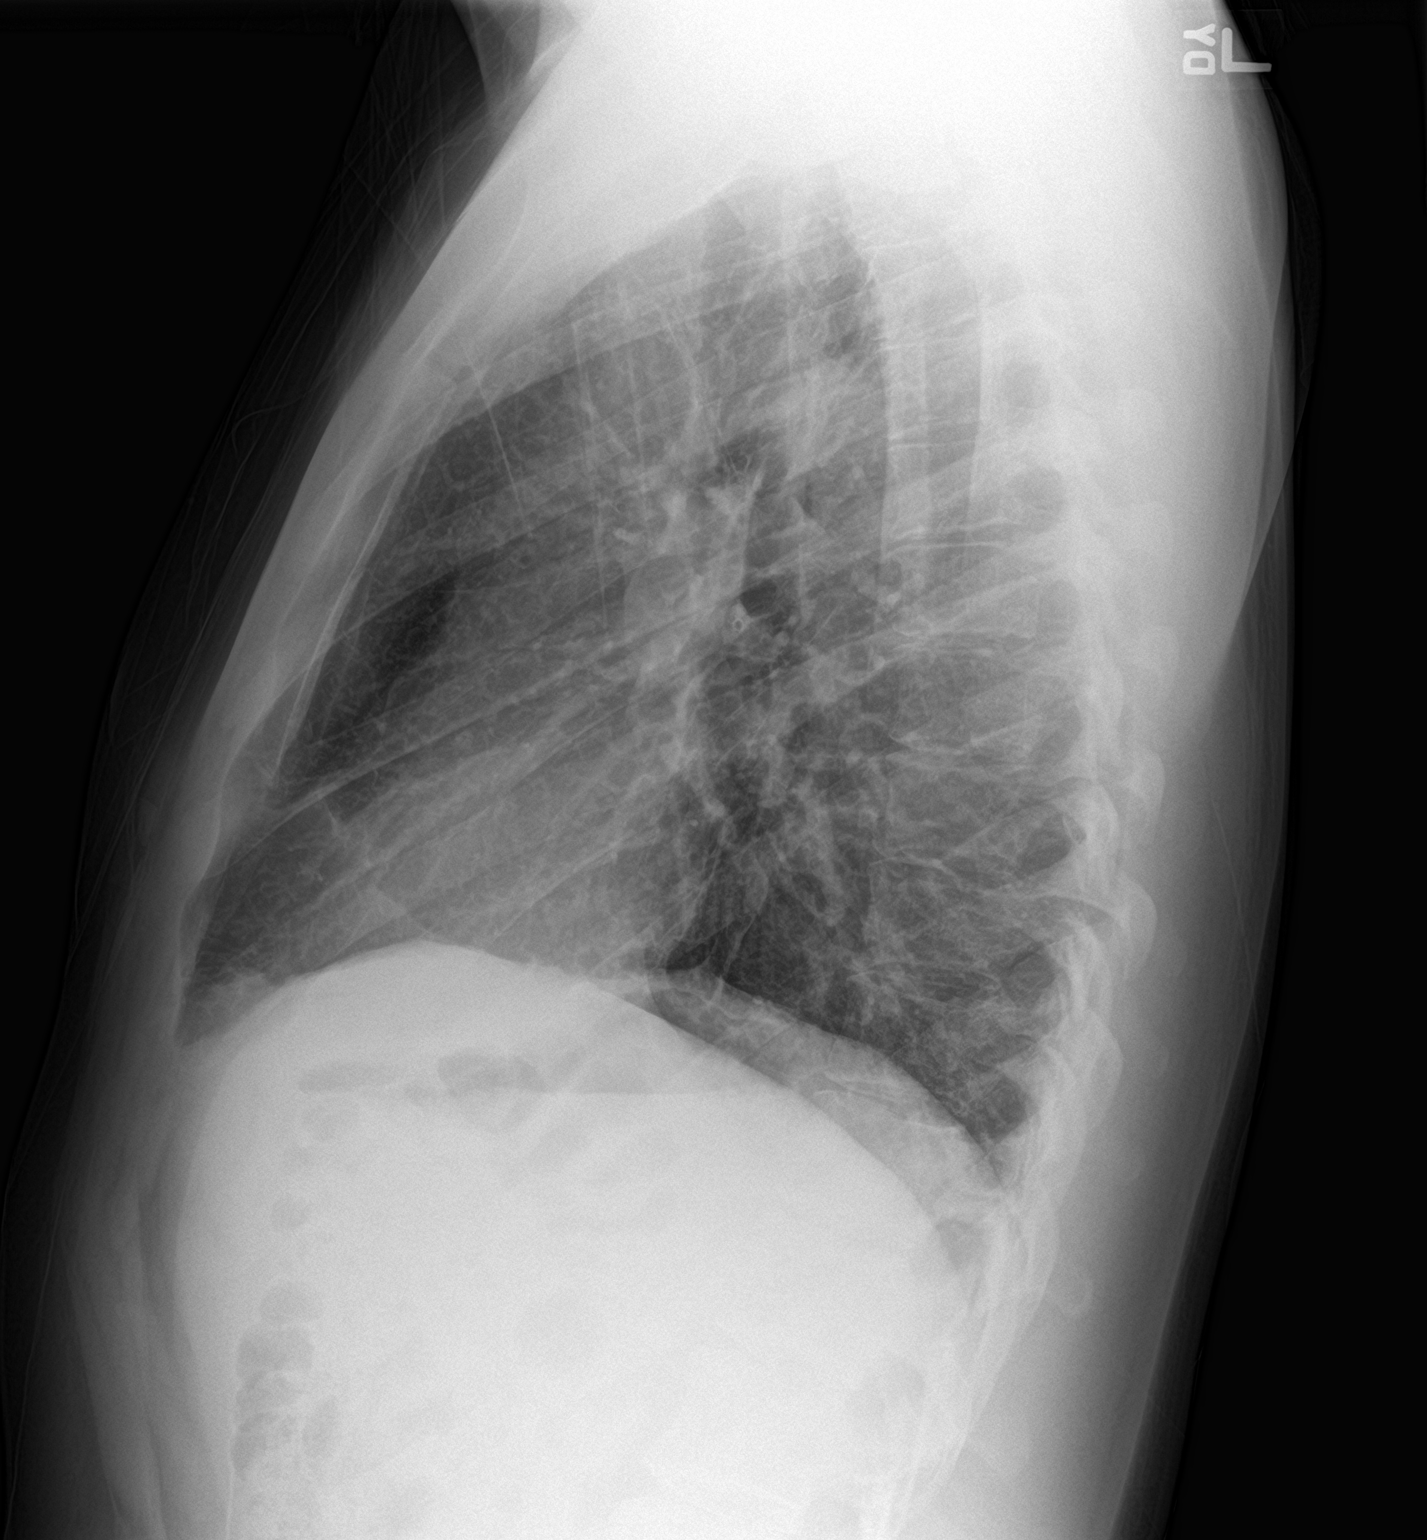

[2 of 2 positions shown; findings below may reference images not displayed]

FINDINGS: The heart size and mediastinal contours are within normal limits.
Both lungs are clear. The visualized skeletal structures are
unremarkable.
IMPRESSION: No active cardiopulmonary disease.
